# Patient Record
Sex: Male | Born: 1945 | Race: White | Hispanic: No | Marital: Married | Smoking: Former smoker
Health system: Southern US, Community
[De-identification: ages and names within clinical notes are randomized; demographics above are authoritative.]

## PROBLEM LIST (undated history)

## (undated) DIAGNOSIS — E785 Hyperlipidemia, unspecified: Secondary | ICD-10-CM

## (undated) DIAGNOSIS — F32A Depression, unspecified: Secondary | ICD-10-CM

## (undated) DIAGNOSIS — F1910 Other psychoactive substance abuse, uncomplicated: Secondary | ICD-10-CM

## (undated) DIAGNOSIS — I10 Essential (primary) hypertension: Secondary | ICD-10-CM

## (undated) DIAGNOSIS — F329 Major depressive disorder, single episode, unspecified: Secondary | ICD-10-CM

## (undated) DIAGNOSIS — I214 Non-ST elevation (NSTEMI) myocardial infarction: Secondary | ICD-10-CM

## (undated) DIAGNOSIS — M1990 Unspecified osteoarthritis, unspecified site: Secondary | ICD-10-CM

## (undated) DIAGNOSIS — Z780 Asymptomatic menopausal state: Secondary | ICD-10-CM

## (undated) HISTORY — PX: VASECTOMY: SHX75

## (undated) HISTORY — DX: Hyperlipidemia, unspecified: E78.5

## (undated) HISTORY — DX: Unspecified osteoarthritis, unspecified site: M19.90

## (undated) HISTORY — DX: Depression, unspecified: F32.A

## (undated) HISTORY — DX: Essential (primary) hypertension: I10

## (undated) HISTORY — PX: POLYPECTOMY: SHX149

## (undated) HISTORY — DX: Asymptomatic menopausal state: Z78.0

## (undated) HISTORY — DX: Other psychoactive substance abuse, uncomplicated: F19.10

## (undated) HISTORY — DX: Major depressive disorder, single episode, unspecified: F32.9

## (undated) HISTORY — PX: HERNIA REPAIR: SHX51

## (undated) HISTORY — PX: COLONOSCOPY: SHX174

---

## 2002-02-01 ENCOUNTER — Encounter: Payer: Self-pay | Admitting: Internal Medicine

## 2002-08-03 ENCOUNTER — Encounter (HOSPITAL_BASED_OUTPATIENT_CLINIC_OR_DEPARTMENT_OTHER): Payer: Self-pay | Admitting: General Surgery

## 2002-08-08 ENCOUNTER — Ambulatory Visit (HOSPITAL_COMMUNITY): Admission: RE | Admit: 2002-08-08 | Discharge: 2002-08-08 | Payer: Self-pay | Admitting: General Surgery

## 2008-01-04 ENCOUNTER — Telehealth: Payer: Self-pay | Admitting: Internal Medicine

## 2008-01-26 DIAGNOSIS — E785 Hyperlipidemia, unspecified: Secondary | ICD-10-CM | POA: Insufficient documentation

## 2008-01-26 DIAGNOSIS — Z87898 Personal history of other specified conditions: Secondary | ICD-10-CM | POA: Insufficient documentation

## 2008-01-26 DIAGNOSIS — F3289 Other specified depressive episodes: Secondary | ICD-10-CM | POA: Insufficient documentation

## 2008-01-26 DIAGNOSIS — K649 Unspecified hemorrhoids: Secondary | ICD-10-CM | POA: Insufficient documentation

## 2008-01-26 DIAGNOSIS — I10 Essential (primary) hypertension: Secondary | ICD-10-CM | POA: Insufficient documentation

## 2008-01-26 DIAGNOSIS — F329 Major depressive disorder, single episode, unspecified: Secondary | ICD-10-CM

## 2008-01-31 ENCOUNTER — Ambulatory Visit: Payer: Self-pay | Admitting: Internal Medicine

## 2008-01-31 DIAGNOSIS — R195 Other fecal abnormalities: Secondary | ICD-10-CM | POA: Insufficient documentation

## 2008-01-31 DIAGNOSIS — K644 Residual hemorrhoidal skin tags: Secondary | ICD-10-CM | POA: Insufficient documentation

## 2008-01-31 DIAGNOSIS — K625 Hemorrhage of anus and rectum: Secondary | ICD-10-CM | POA: Insufficient documentation

## 2008-02-03 ENCOUNTER — Ambulatory Visit: Payer: Self-pay | Admitting: Internal Medicine

## 2008-02-03 ENCOUNTER — Encounter: Payer: Self-pay | Admitting: Internal Medicine

## 2008-02-06 ENCOUNTER — Encounter: Payer: Self-pay | Admitting: Internal Medicine

## 2008-02-07 ENCOUNTER — Telehealth (INDEPENDENT_AMBULATORY_CARE_PROVIDER_SITE_OTHER): Payer: Self-pay

## 2008-04-13 ENCOUNTER — Encounter: Payer: Self-pay | Admitting: Internal Medicine

## 2010-04-07 ENCOUNTER — Emergency Department (HOSPITAL_COMMUNITY)
Admission: EM | Admit: 2010-04-07 | Discharge: 2010-04-07 | Payer: Self-pay | Source: Home / Self Care | Admitting: Emergency Medicine

## 2010-04-08 LAB — POCT CARDIAC MARKERS
CKMB, poc: 1.4 ng/mL (ref 1.0–8.0)
Troponin i, poc: <0.05 ng/mL (ref 0.00–0.09)

## 2010-04-08 LAB — POCT I-STAT 3, VENOUS BLOOD GAS (G3P V)
Acid-Base Excess: 1 mmol/L (ref 0.0–2.0)
Bicarbonate: 27 mEq/L — ABNORMAL HIGH (ref 20.0–24.0)
O2 Saturation: 46 %
O2 Saturation: 99 %
TCO2: 28 mmol/L (ref 0–100)
pCO2, Ven: 41.5 mmHg — ABNORMAL LOW (ref 45.0–50.0)
pH, Ven: 7.401 — ABNORMAL HIGH (ref 7.250–7.300)
pO2, Ven: 26 mmHg — CL (ref 30.0–45.0)
pO2, Ven: 118 mmHg — ABNORMAL HIGH (ref 30.0–45.0)

## 2010-04-08 LAB — COMPREHENSIVE METABOLIC PANEL
Albumin: 3.9 g/dL (ref 3.5–5.2)
Alkaline Phosphatase: 49 U/L (ref 39–117)
BUN: 13 mg/dL (ref 6–23)
Creatinine, Ser: 1.07 mg/dL (ref 0.4–1.5)
Potassium: 3.9 mEq/L (ref 3.5–5.1)
Total Protein: 6.4 g/dL (ref 6.0–8.3)

## 2010-04-08 LAB — CARBOXYHEMOGLOBIN
Carboxyhemoglobin: 2.4 % — ABNORMAL HIGH (ref 0.5–1.5)
Methemoglobin: 1 % (ref 0.0–1.5)
O2 Saturation: 64.5 %
O2 Saturation: 98.6 %
Total hemoglobin: 13.9 g/dL (ref 13.5–18.0)

## 2010-04-08 LAB — CBC
MCV: 84.6 fL (ref 78.0–100.0)
Platelets: 208 10*3/uL (ref 150–400)
RBC: 4.62 MIL/uL (ref 4.22–5.81)
WBC: 8.5 10*3/uL (ref 4.0–10.5)

## 2010-08-01 NOTE — Op Note (Signed)
NAME:  Edwin Mckee, Edwin Mckee NO.:  [id number]   MEDICAL RECORD NO.:  [id number]                   PATIENT TYPE:  OIB   LOCATION:  2873                                 FACILITY:  [facility]   PHYSICIAN:  Leonie Man, M.D.                DATE OF BIRTH:  04-23-1945   DATE OF PROCEDURE:  08/08/2002  DATE OF DISCHARGE:                                 OPERATIVE REPORT   PREOPERATIVE DIAGNOSIS:  Left inguinal hernia.   POSTOPERATIVE DIAGNOSIS:  Left inguinal hernia.   PROCEDURE:  Repair of left inguinal hernia with mesh.   SURGEON:  Leonie Man, M.D.   ASSISTANT:  Nurse.   ANESTHESIA:  General.   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  He comes now to the operating room for a repair of  his left inguinal hernia, after the risks and potential benefits of surgery  have been discussed, questions answered, and consent obtained.   DESCRIPTION OF PROCEDURE:  Following the induction of satisfactory general  anesthesia, with the patient positioned supinely, the left groin was prepped  and draped, to be included in the sterile operative field.  The lower  abdominal crease was infiltrated with 0.5% Marcaine with epinephrine, and a  transverse incision made in the lower abdominal crease, with dissection  carried down to the external oblique aponeurosis.  The external oblique  aponeurosis was then opened up through the external inguinal ring with  protection of the ilioinguinal nerve which was retracted inferiorly and  laterally.  The spermatic cord and its contents were elevated and held with  a Penrose drain.  Dissection along the anteromedial aspect of the spermatic  cord showed a moderate-sized lipoma which was dissected up to the internal  ring, crossclamped, and transected.  The lipoma was discarded, and the stump  secured  with a #2-0 silk tie.  There was no evidence of an indirect hernia.  There was, however, a very large direct hernia coming straight from the  Hesselbach's triangle, and that hernia was then repaired with an onlay patch  of polypropylene mesh which was fashioned to fit into the groin, and sewn in  that pubic tubercle with #2-0 Novofil suture and carried up along the  conjoined tendon to the internal ring.  Again, from the pubic tubercle a  second suture was brought up along the shelving edge of Poupart's ligament,  up to the internal ring.  The mesh had been split, so as to allow normal  protrusion of the cord.  The tails of the mesh were then trimmed and sutured  down into the internal oblique muscles behind the cord.  On checking, there  was no cord entrapment.  The ilioinguinal nerve was free and intact.  All  areas  of dissection were checked for hemostasis and noted to be dry.  The  sponge, instrument and sharp counts were verified.  The external oblique  aponeurosis was closed over the cord with a running #2-0 Vicryl suture.  Scarpa's fascia and the subcutaneous fat were closed with a running #3-0  Vicryl suture, and the skin closed with a running #4-0 Monocryl suture, and  then reinforced with Steri-Strips.  Sterile dressings  were applied.  The anesthetic was reversed.  The patient was removed from the operating room to the recovery room in  stable condition.  He tolerated the procedure well.                                                Leonie Man, M.D.    PB/MEDQ  D:  08/08/2002  T:  08/08/2002  Job:  [id number]

## 2011-03-04 ENCOUNTER — Encounter (INDEPENDENT_AMBULATORY_CARE_PROVIDER_SITE_OTHER): Payer: Federal, State, Local not specified - PPO | Admitting: Internal Medicine

## 2011-03-04 DIAGNOSIS — F341 Dysthymic disorder: Secondary | ICD-10-CM

## 2011-03-04 DIAGNOSIS — Z23 Encounter for immunization: Secondary | ICD-10-CM

## 2011-03-04 DIAGNOSIS — I10 Essential (primary) hypertension: Secondary | ICD-10-CM

## 2011-03-04 DIAGNOSIS — E7800 Pure hypercholesterolemia, unspecified: Secondary | ICD-10-CM

## 2011-03-04 DIAGNOSIS — Z0000 Encounter for general adult medical examination without abnormal findings: Secondary | ICD-10-CM

## 2011-09-02 ENCOUNTER — Ambulatory Visit: Payer: Federal, State, Local not specified - PPO | Admitting: Internal Medicine

## 2012-02-24 ENCOUNTER — Encounter: Payer: Federal, State, Local not specified - PPO | Admitting: Internal Medicine

## 2012-02-27 ENCOUNTER — Other Ambulatory Visit: Payer: Self-pay | Admitting: Internal Medicine

## 2012-02-27 NOTE — Telephone Encounter (Signed)
Please pull paper chart.  

## 2012-02-29 NOTE — Telephone Encounter (Signed)
Chart pulled to PA pool [id number]

## 2012-04-12 ENCOUNTER — Ambulatory Visit
Admission: RE | Admit: 2012-04-12 | Discharge: 2012-04-12 | Disposition: A | Discharge status: Home / Self Care | Payer: Federal, State, Local not specified - PPO | Source: Ambulatory Visit | Attending: Family Medicine | Admitting: Family Medicine

## 2012-04-12 ENCOUNTER — Ambulatory Visit (INDEPENDENT_AMBULATORY_CARE_PROVIDER_SITE_OTHER): Payer: Federal, State, Local not specified - PPO | Admitting: Family Medicine

## 2012-04-12 ENCOUNTER — Telehealth: Payer: Self-pay

## 2012-04-12 VITALS — BP 169/82 | HR 76 | Temp 97.9°F | Resp 18

## 2012-04-12 DIAGNOSIS — R109 Unspecified abdominal pain: Secondary | ICD-10-CM

## 2012-04-12 LAB — POCT URINALYSIS DIPSTICK
Bilirubin, UA: NEGATIVE
Blood, UA: NEGATIVE
Glucose, UA: NEGATIVE
Ketones, UA: NEGATIVE
Leukocytes, UA: NEGATIVE
Nitrite, UA: NEGATIVE
Protein, UA: NEGATIVE
Spec Grav, UA: <=1.005
Urobilinogen, UA: 0.2
pH, UA: 5.5

## 2012-04-12 LAB — POCT UA - MICROSCOPIC ONLY
Bacteria, U Microscopic: NEGATIVE
Casts, Ur, LPF, POC: NEGATIVE
Crystals, Ur, HPF, POC: NEGATIVE
Mucus, UA: NEGATIVE
RBC, urine, microscopic: NEGATIVE
WBC, Ur, HPF, POC: NEGATIVE
Yeast, UA: NEGATIVE

## 2012-04-12 MED ORDER — PREDNISONE 20 MG PO TABS: ORAL_TABLET | ORAL | Status: DC

## 2012-04-12 NOTE — Progress Notes (Signed)
67 yo with right testicular pain, right back and flank pain, and dysuria since Friday.  Saturday the pain was excrutiating.  Seemed to improve yesterday but it is worse today. The back pain is particularly bad. Some nausea with the pain.  No prior kidney stone or pain like this  Objective  NAD, alert Mild right CVA tenderness Circumcised male, normal testes Results for orders placed in visit on 04/12/12  POCT UA - MICROSCOPIC ONLY      Component Value Range   WBC, Ur, HPF, POC neg     RBC, urine, microscopic neg     Bacteria, U Microscopic neg     Mucus, UA neg     Epithelial cells, urine per micros 0-1     Crystals, Ur, HPF, POC neg     Casts, Ur, LPF, POC neg     Yeast, UA neg    POCT URINALYSIS DIPSTICK      Component Value Range   Color, UA yellow     Clarity, UA clear     Glucose, UA neg     Bilirubin, UA neg     Ketones, UA neg     Spec Grav, UA <=1.005     Blood, UA neg     pH, UA 5.5     Protein, UA neg     Urobilinogen, UA 0.2     Nitrite, UA neg     Leukocytes, UA Negative     Assessment: acute flank pain c/w kidney stone but the urine is negative.  Plan:  CT scan without contrast ---> negative   at this point, I think patient probably has a musculoskeletal problem and therefore I am going to prescribe prednisone 1. Acute flank pain  POCT UA - Microscopic Only, POCT urinalysis dipstick, Urine culture, US Renal, CT Abdomen Pelvis Wo Contrast, predniSONE (DELTASONE) 20 MG tablet, DISCONTINUED: predniSONE (DELTASONE) 20 MG tablet

## 2012-04-12 NOTE — Patient Instructions (Addendum)
Go to [facility] now for your CT scan.   Driving directions to [address] [place], [place], [place] [address] [id number]  - more info    [address]  [place], [place] [address]     1. Head south on [address] toward [address]      0.5 mi    2. Sharp left onto [address]      0.6 mi    3. Turn left onto the [address] ramp      0.2 mi    4. Merge onto [address]      3.0 mi    5. Continue straight to stay on [address]      0.4 mi    6. Slight left to stay on [facility]  Destination will be on the right     1.0 mi     [address] [place], [place] [address]

## 2012-04-12 NOTE — Telephone Encounter (Signed)
Patient saw dr. Milus Mckee and prescribed  Pregnzone, he wanted to speak to someone in clinical concerning this rx and why he wasn't given any pain medication.even though he didn't ask for any.  T: [phone]

## 2012-04-13 LAB — URINE CULTURE
Colony Count: NO GROWTH
Organism ID, Bacteria: NO GROWTH

## 2012-04-13 MED ORDER — TRAMADOL HCL 50 MG PO TABS: 50.0000 mg | ORAL_TABLET | Freq: Three times a day (TID) | ORAL | Status: DC | PRN

## 2012-04-13 NOTE — Telephone Encounter (Signed)
Dr Milus Glazier has sent in Tramadol. I have called patient to advise.

## 2012-04-13 NOTE — Telephone Encounter (Signed)
Called patient advised will send message to Dr Milus Glazier to see if he can write for pain meds. [facility] uses [facility], please advise. Patient states he is very upset. I advised him I will call him back today.

## 2012-04-30 ENCOUNTER — Other Ambulatory Visit: Payer: Self-pay

## 2012-05-04 ENCOUNTER — Ambulatory Visit (INDEPENDENT_AMBULATORY_CARE_PROVIDER_SITE_OTHER): Payer: Federal, State, Local not specified - PPO | Admitting: Internal Medicine

## 2012-05-04 ENCOUNTER — Encounter: Payer: Self-pay | Admitting: Internal Medicine

## 2012-05-04 VITALS — BP 140/86 | HR 63 | Temp 98.2°F | Resp 16 | Ht 69.5 in | Wt 196.6 lb

## 2012-05-04 DIAGNOSIS — H9190 Unspecified hearing loss, unspecified ear: Secondary | ICD-10-CM

## 2012-05-04 DIAGNOSIS — R000 Tachycardia, unspecified: Secondary | ICD-10-CM

## 2012-05-04 DIAGNOSIS — R51 Headache: Secondary | ICD-10-CM

## 2012-05-04 DIAGNOSIS — E785 Hyperlipidemia, unspecified: Secondary | ICD-10-CM

## 2012-05-04 DIAGNOSIS — F329 Major depressive disorder, single episode, unspecified: Secondary | ICD-10-CM

## 2012-05-04 DIAGNOSIS — I10 Essential (primary) hypertension: Secondary | ICD-10-CM

## 2012-05-04 DIAGNOSIS — F3289 Other specified depressive episodes: Secondary | ICD-10-CM

## 2012-05-04 DIAGNOSIS — Z0000 Encounter for general adult medical examination without abnormal findings: Secondary | ICD-10-CM

## 2012-05-04 LAB — CBC WITH DIFFERENTIAL/PLATELET
Basophils Absolute: 0 10*3/uL (ref 0.0–0.1)
Eosinophils Absolute: 0.1 10*3/uL (ref 0.0–0.7)
Eosinophils Relative: 1 % (ref 0–5)
Lymphocytes Relative: 34 % (ref 12–46)
MCV: 82.7 fL (ref 78.0–100.0)
Neutrophils Relative %: 58 % (ref 43–77)
Platelets: 264 10*3/uL (ref 150–400)
RBC: 5.1 MIL/uL (ref 4.22–5.81)
RDW: 14.1 % (ref 11.5–15.5)
WBC: 6.1 10*3/uL (ref 4.0–10.5)

## 2012-05-04 LAB — TSH: TSH: 3.201 u[IU]/mL (ref 0.350–4.500)

## 2012-05-04 LAB — POCT URINALYSIS DIPSTICK
Blood, UA: NEGATIVE
Nitrite, UA: NEGATIVE
Protein, UA: NEGATIVE
Urobilinogen, UA: 0.2
pH, UA: 6

## 2012-05-04 LAB — COMPREHENSIVE METABOLIC PANEL
Albumin: 4.4 g/dL (ref 3.5–5.2)
BUN: 15 mg/dL (ref 6–23)
CO2: 23 mEq/L (ref 19–32)
Glucose, Bld: 100 mg/dL — ABNORMAL HIGH (ref 70–99)
Potassium: 4.1 mEq/L (ref 3.5–5.3)
Sodium: 139 mEq/L (ref 135–145)
Total Protein: 6.9 g/dL (ref 6.0–8.3)

## 2012-05-04 LAB — LIPID PANEL
Cholesterol: 188 mg/dL (ref 0–200)
HDL: 44 mg/dL (ref >39)

## 2012-05-04 LAB — PSA: PSA: 0.65 ng/mL (ref <=4.00)

## 2012-05-04 MED ORDER — LISINOPRIL-HYDROCHLOROTHIAZIDE 10-12.5 MG PO TABS: 1.0000 | ORAL_TABLET | Freq: Every day | ORAL | Status: DC

## 2012-05-04 NOTE — Progress Notes (Addendum)
Subjective:    Patient ID: Edwin Mckee, male    DOB: 1945/12/07, 67 y.o.   MRN: [id number]  HPIc/o 1 month hx of daily HAs with flushing, tachycardia and elevated BP- see hx of decrease from lisin/hct to just hct 1 year ago. Home BPs good til one month ago. No lifestyle or dietary changes. See ROS  Needs benadryl 25 to sleep for 7-8 mos w/out side effects  Ran [employer] w/ daughter this year  See  1/28 OV for flank pain-neg Ct  Brother SVT tdap 11/17/2006 pneumo 02/2011  Review of Systems  Constitutional: Negative for fever, activity change, appetite change, fatigue and unexpected weight change.  HENT: Positive for hearing loss, facial swelling, neck pain and neck stiffness. Negative for ear pain, congestion, rhinorrhea, sneezing, trouble swallowing, dental problem, postnasal drip, sinus pressure and tinnitus.        Hearing aids stable for at least 5 years Occasional neck stiffness in the morning which usually resolves with exercise Occasional facial swelling with the exposure to allergens but this never interferes with activity  Eyes: Negative for photophobia, discharge and visual disturbance.  Respiratory: Positive for shortness of breath. Negative for cough and wheezing.        Occasional shortness of breath when he feels like the palpitations and flushing are active, but no dyspnea on exertion  Cardiovascular: Positive for palpitations. Negative for chest pain and leg swelling.  Gastrointestinal: Negative for abdominal pain, diarrhea and constipation.  Endocrine: Negative for cold intolerance, heat intolerance, polydipsia, polyphagia and polyuria.  Genitourinary: Positive for testicular pain. Negative for dysuria, urgency, frequency, hematuria, flank pain and decreased urine volume.       Testicular pain has been on and off in the right testicle for a few years without swelling or urinary discomfort  Musculoskeletal: Positive for back pain and arthralgias. Negative for  myalgias, joint swelling and gait problem.       Had a recent episode of lumbar strain which took 6 weeks of exercise to get back to normal/has recovered and is just now running again  Skin: Negative for rash and wound.  Neurological: Positive for light-headedness and headaches. Negative for dizziness, tremors, syncope, weakness and numbness.  Hematological: Negative for adenopathy. Does not bruise/bleed easily.  Psychiatric/Behavioral: Negative for suicidal ideas, sleep disturbance, self-injury, dysphoric mood and decreased concentration.       After years of Prozac has discontinued this and has had 6 months of medications without any relapse of depression or anxiety       Objective:   Physical Exam BP 140/86  Pulse 63  Temp(Src) 98.2 F (36.8 C) (Oral)  Resp 16  Ht 5' 9.5" (1.765 m)  Wt 196 lb 9.6 oz (89.177 kg)  BMI 28.63 kg/m2  SpO2 98% No acute distress Pupils equal round reactive to light and accommodation/EOMs conjugate ENT clear No nodes or thyromegaly Heart regular without murmur or click/no carotid bruits Lungs clear to auscultation Abdomen supple without organomegaly or masses Genital exam-testicles normal except scars from vasectomy tie off Lumbar spine straight and nontender/straight leg raise negative to 90 Shoulders hips knees and ankles intact Neurological intact Psychiatric intact  EKG= within normal limits    Assessment & Plan:  Annual physical exam Problem #1 hypertension-recently labile and associated with headache flushing and palpitations  Uncontrolled by history over the recent 3 weeks Problem #2 history of hyperlipidemia Problem #3 history of hemorrhoids Problem #4 history hearing loss/aides Problem #5 multiple arthralgias Problem #6 recent back pain  Problem #7 depression resolved  Results for orders placed in visit on 05/04/12  CBC WITH DIFFERENTIAL      Result Value Range   WBC 6.1  4.0 - 10.5 K/uL   RBC 5.10  4.22 - 5.81 MIL/uL    Hemoglobin 14.7  13.0 - 17.0 g/dL   HCT 16.1  09.6 - 04.5 %   MCV 82.7  78.0 - 100.0 fL   MCH 28.8  26.0 - 34.0 pg   MCHC 34.8  30.0 - 36.0 g/dL   RDW 40.9  81.1 - 91.4 %   Platelets 264  150 - 400 K/uL   Neutrophils Relative 58  43 - 77 %   Neutro Abs 3.5  1.7 - 7.7 K/uL   Lymphocytes Relative 34  12 - 46 %   Lymphs Abs 2.1  0.7 - 4.0 K/uL   Monocytes Relative 7  3 - 12 %   Monocytes Absolute 0.4  0.1 - 1.0 K/uL   Eosinophils Relative 1  0 - 5 %   Eosinophils Absolute 0.1  0.0 - 0.7 K/uL   Basophils Relative 0  0 - 1 %   Basophils Absolute 0.0  0.0 - 0.1 K/uL   Smear Review Criteria for review not met    LIPID PANEL      Result Value Range   Cholesterol 188  0 - 200 mg/dL   Triglycerides 782  <956 mg/dL   HDL 44  >21 mg/dL   Total CHOL/HDL Ratio 4.3     VLDL 24  0 - 40 mg/dL   LDL Cholesterol 308 (*) 0 - 99 mg/dL  COMPREHENSIVE METABOLIC PANEL      Result Value Range   Sodium 139  135 - 145 mEq/L   Potassium 4.1  3.5 - 5.3 mEq/L   Chloride 104  96 - 112 mEq/L   CO2 23  19 - 32 mEq/L   Glucose, Bld 100 (*) 70 - 99 mg/dL   BUN 15  6 - 23 mg/dL   Creat 6.57  8.46 - 9.62 mg/dL   Total Bilirubin 0.6  0.3 - 1.2 mg/dL   Alkaline Phosphatase 52  39 - 117 U/L   AST 28  0 - 37 U/L   ALT 38  0 - 53 U/L   Total Protein 6.9  6.0 - 8.3 g/dL   Albumin 4.4  3.5 - 5.2 g/dL   Calcium 9.7  8.4 - 95.2 mg/dL  TSH      Result Value Range   TSH 3.201  0.350 - 4.500 uIU/mL  PSA      Result Value Range   PSA 0.65  <=4.00 ng/mL  POCT URINALYSIS DIPSTICK      Result Value Range   Color, UA yellow     Clarity, UA clear     Glucose, UA neg     Bilirubin, UA neg     Ketones, UA neg     Spec Grav, UA <=1.005     Blood, UA neg     pH, UA 6.0     Protein, UA neg     Urobilinogen, UA 0.2     Nitrite, UA neg     Leukocytes, UA Negative     Add lisinopril to hydrochlorothiazide Change Zocor to Lipitor 40 Will order a 24-hour urine for metanephrines and catecholamines

## 2012-05-04 NOTE — Progress Notes (Deleted)
  Subjective:    Patient ID: Edwin Mckee, male    DOB: [birth year], 67 y.o.   MRN: [id number]  HPI    Review of Systems  Constitutional: Positive for fatigue.  HENT: Positive for hearing loss, facial swelling, neck pain and neck stiffness.   Respiratory: Positive for shortness of breath.   Genitourinary: Positive for testicular pain.  Musculoskeletal: Positive for myalgias, back pain, joint swelling and arthralgias.  Skin: Negative.   Allergic/Immunologic: Negative.   Neurological: Positive for light-headedness and headaches.  Hematological: Negative.   Psychiatric/Behavioral: Negative.        Objective:   Physical Exam        Assessment & Plan:

## 2012-05-06 MED ORDER — ATORVASTATIN CALCIUM 40 MG PO TABS: 40.0000 mg | ORAL_TABLET | Freq: Every day | ORAL | Status: DC

## 2012-05-06 NOTE — Addendum Note (Signed)
Addended by: Tonye Pearson on: 05/06/2012 11:47 PM   Modules accepted: Orders

## 2012-06-01 ENCOUNTER — Telehealth: Payer: Self-pay | Admitting: *Deleted

## 2012-06-01 NOTE — Telephone Encounter (Signed)
Lmom.  Pt was suppose to have collected 24hr urine specimans and have not yet to collect.  i was calling to see if maybe he needed another req.

## 2012-06-07 NOTE — Telephone Encounter (Signed)
Unable to reach letter mailed to patient

## 2012-06-16 ENCOUNTER — Ambulatory Visit (INDEPENDENT_AMBULATORY_CARE_PROVIDER_SITE_OTHER): Payer: Federal, State, Local not specified - PPO | Admitting: Physician Assistant

## 2012-06-16 VITALS — BP 118/84 | HR 75 | Temp 98.4°F | Resp 17 | Ht 70.5 in | Wt 192.0 lb

## 2012-06-16 DIAGNOSIS — F3289 Other specified depressive episodes: Secondary | ICD-10-CM

## 2012-06-16 DIAGNOSIS — F32A Depression, unspecified: Secondary | ICD-10-CM

## 2012-06-16 DIAGNOSIS — F329 Major depressive disorder, single episode, unspecified: Secondary | ICD-10-CM

## 2012-06-16 MED ORDER — FLUOXETINE HCL 40 MG PO CAPS: 40.0000 mg | ORAL_CAPSULE | Freq: Every day | ORAL | Status: DC

## 2012-06-16 NOTE — Patient Instructions (Addendum)
Restart the fluoxetine 40mg  once daily.  If you are having side effects when starting this dose, please let us know and we can send a smaller dose an titrate you up.  Please give [place] a call in one month and let us know how the medication is doing.  Certainly if any concerns arise we would be happy to see you back in clinic.

## 2012-06-16 NOTE — Progress Notes (Signed)
  Subjective:    Patient ID: Edwin Mckee, male    DOB: [birth year], 67 y.o.   MRN: [id number]  HPI   Edwin Mckee is a very pleasant 67 yr old male here desiring to restart fluoxetine.  States he has been on this for about 20 yrs, but decided to stop at last office visit.  States that his wife would like him to restart this.  He tells me he feels well but that his wife notices things that he doesn't.  Thinks that his wife perceives more conflict now that he has stopped the prozac.  Does endorse that he is feeling a little more down recently.  Has difficulty dealing with the fact that he is aging and less able to do thinks he once could.  "I'm aging, but not gracefully."  Lately has not really felt like getting out and doing stuff, instead just sitting inside on the computer.  Recognizes that he may just need to be on this medication.  He has been on it long term and is ok with continuing.  Additionally, pt was ordered a 24 hour urine at last visit due to elevated BP, HA, palpitations, and flushing.  Pt did not have this done, and feels like symptoms have resolved.  Now has great control of BP with addition of lisinopril to HCTZ.  He does not want to proceed with the 24 hour urine at this time.      Review of Systems  Constitutional: Negative.   HENT: Negative.   Respiratory: Negative.   Cardiovascular: Negative for chest pain and palpitations.  Gastrointestinal: Negative.   Neurological: Negative.   Psychiatric/Behavioral: Positive for dysphoric mood.       Objective:   Physical Exam  Vitals reviewed. Constitutional: He is oriented to person, place, and time. He appears well-developed and well-nourished. No distress.  HENT:  Head: Normocephalic and atraumatic.  Eyes: Conjunctivae are normal. No scleral icterus.  Cardiovascular: Normal rate, regular rhythm and normal heart sounds.   Pulmonary/Chest: Effort normal and breath sounds normal. He has no wheezes. He has no rales.  Neurological:  He is alert and oriented to person, place, and time.  Skin: Skin is warm and dry.  Psychiatric: He has a normal mood and affect. His speech is normal and behavior is normal. Judgment and thought content normal. He expresses no homicidal and no suicidal ideation.     Filed Vitals:   06/16/12 0931  BP: 118/84  Pulse: 75  Temp: 98.4 F (36.9 C)  Resp: 17        Assessment & Plan:  Depression - Plan: FLUoxetine (PROZAC) 40 MG capsule   Edwin Mckee is a very pleasant 67 yr old male here desiring to restart fluoxetine.  Was previously on 40mg  daily.  He would like to restart at this dose.  If he is experiencing side effects initially with this we can decreased the dose and titrate up.  Pt will call in 1 month to let us know how restarting this is going.  Pt understands and is in agreement.

## 2012-11-02 ENCOUNTER — Ambulatory Visit (INDEPENDENT_AMBULATORY_CARE_PROVIDER_SITE_OTHER): Payer: Federal, State, Local not specified - PPO | Admitting: Family Medicine

## 2012-11-02 VITALS — BP 140/82 | HR 69 | Temp 98.6°F | Resp 16 | Ht 70.5 in | Wt 190.6 lb

## 2012-11-02 DIAGNOSIS — I10 Essential (primary) hypertension: Secondary | ICD-10-CM

## 2012-11-02 DIAGNOSIS — R5381 Other malaise: Secondary | ICD-10-CM

## 2012-11-02 MED ORDER — LOSARTAN POTASSIUM-HCTZ 100-12.5 MG PO TABS: 1.0000 | ORAL_TABLET | Freq: Every day | ORAL | Status: DC

## 2012-11-02 MED ORDER — AMLODIPINE BESYLATE 2.5 MG PO TABS: 2.5000 mg | ORAL_TABLET | Freq: Every day | ORAL | Status: DC

## 2012-11-02 NOTE — Progress Notes (Signed)
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  Blood pressures are running over 150/85  F/H positive for hypertension (father), he developed dementia and alzheimer's and died in 2003/08/15 Quit the lisinopril 7/20.  Cough disappeared 8/1. Last week he took his wife's Losartan HCT 100-12.5  Gets regular exercise, run or walk daily, golf twice a week, yoga 5 days a week Very drowsy, needing to take nap every afternoon.  This has been a problem for over a year.  Objective:  NAD, appears to be very fit, good eye contact and appropriate. BP 130/82 Results for orders placed in visit on 05/04/12  CBC WITH DIFFERENTIAL      Result Value Range   WBC 6.1  4.0 - 10.5 K/uL   RBC 5.10  4.22 - 5.81 MIL/uL   Hemoglobin 14.7  13.0 - 17.0 g/dL   HCT 16.1  09.6 - 04.5 %   MCV 82.7  78.0 - 100.0 fL   MCH 28.8  26.0 - 34.0 pg   MCHC 34.8  30.0 - 36.0 g/dL   RDW 40.9  81.1 - 91.4 %   Platelets 264  150 - 400 K/uL   Neutrophils Relative % 58  43 - 77 %   Neutro Abs 3.5  1.7 - 7.7 K/uL   Lymphocytes Relative 34  12 - 46 %   Lymphs Abs 2.1  0.7 - 4.0 K/uL   Monocytes Relative 7  3 - 12 %   Monocytes Absolute 0.4  0.1 - 1.0 K/uL   Eosinophils Relative 1  0 - 5 %   Eosinophils Absolute 0.1  0.0 - 0.7 K/uL   Basophils Relative 0  0 - 1 %   Basophils Absolute 0.0  0.0 - 0.1 K/uL   Smear Review Criteria for review not met    LIPID PANEL      Result Value Range   Cholesterol 188  0 - 200 mg/dL   Triglycerides 782  <956 mg/dL   HDL 44  >21 mg/dL   Total CHOL/HDL Ratio 4.3     VLDL 24  0 - 40 mg/dL   LDL Cholesterol 308 (*) 0 - 99 mg/dL  COMPREHENSIVE METABOLIC PANEL      Result Value Range   Sodium 139  135 - 145 mEq/L   Potassium 4.1  3.5 - 5.3 mEq/L   Chloride 104  96 - 112 mEq/L   CO2 23  19 - 32 mEq/L   Glucose, Bld 100 (*) 70 - 99 mg/dL   BUN 15  6 - 23 mg/dL   Creat 6.57  8.46 - 9.62 mg/dL   Total Bilirubin 0.6  0.3 - 1.2 mg/dL   Alkaline Phosphatase 52  39 - 117 U/L   AST 28  0 - 37 U/L   ALT 38  0 - 53 U/L   Total Protein 6.9  6.0 - 8.3 g/dL   Albumin 4.4  3.5 - 5.2 g/dL   Calcium 9.7  8.4 - 95.2 mg/dL  TSH      Result Value Range   TSH 3.201  0.350 - 4.500 uIU/mL  PSA      Result Value Range   PSA 0.65  <=4.00 ng/mL  POCT URINALYSIS DIPSTICK      Result Value Range   Color, UA yellow     Clarity, UA clear     Glucose, UA neg     Bilirubin, UA neg     Ketones, UA neg  Spec Grav, UA <=1.005     Blood, UA neg     pH, UA 6.0     Protein, UA neg     Urobilinogen, UA 0.2     Nitrite, UA neg     Leukocytes, UA Negative     Assessment: borderline HTN values on Losartan. The fatigue is concerning to me. I think that his cholesterol-lowering medicine may be contributing to this, and the patient is incredibly physically active. Therefore think it's reasonable to discontinue the cholesterol medicine for several months to see if his fatigue doesn't left. Also I think checking the potassium be important  Plan:  Hypertension - Plan: Comprehensive metabolic panel, losartan-hydrochlorothiazide (HYZAAR) 100-12.5 MG per tablet, amLODipine (NORVASC) 2.5 MG tablet  Signed, Elvina Sidle, MD

## 2012-11-03 LAB — COMPREHENSIVE METABOLIC PANEL
ALT: 29 U/L (ref 0–53)
AST: 27 U/L (ref 0–37)
Albumin: 4.8 g/dL (ref 3.5–5.2)
Alkaline Phosphatase: 64 U/L (ref 39–117)
BUN: 13 mg/dL (ref 6–23)
CO2: 29 mEq/L (ref 19–32)
Calcium: 9.9 mg/dL (ref 8.4–10.5)
Chloride: 103 mEq/L (ref 96–112)
Creat: 1.01 mg/dL (ref 0.50–1.35)
Glucose, Bld: 89 mg/dL (ref 70–99)
Potassium: 4.3 mEq/L (ref 3.5–5.3)
Sodium: 139 mEq/L (ref 135–145)
Total Bilirubin: 0.7 mg/dL (ref 0.3–1.2)
Total Protein: 7.6 g/dL (ref 6.0–8.3)

## 2012-12-05 ENCOUNTER — Encounter: Payer: Self-pay | Admitting: Internal Medicine

## 2013-01-19 ENCOUNTER — Other Ambulatory Visit: Payer: Self-pay

## 2013-05-10 ENCOUNTER — Encounter: Payer: Self-pay | Admitting: Internal Medicine

## 2013-05-10 ENCOUNTER — Ambulatory Visit (INDEPENDENT_AMBULATORY_CARE_PROVIDER_SITE_OTHER): Payer: Federal, State, Local not specified - PPO | Admitting: Internal Medicine

## 2013-05-10 VITALS — BP 152/94 | HR 76 | Temp 98.0°F | Resp 18 | Ht 70.5 in | Wt 199.0 lb

## 2013-05-10 DIAGNOSIS — Z125 Encounter for screening for malignant neoplasm of prostate: Secondary | ICD-10-CM

## 2013-05-10 DIAGNOSIS — Z0000 Encounter for general adult medical examination without abnormal findings: Secondary | ICD-10-CM

## 2013-05-10 DIAGNOSIS — I10 Essential (primary) hypertension: Secondary | ICD-10-CM

## 2013-05-10 DIAGNOSIS — E785 Hyperlipidemia, unspecified: Secondary | ICD-10-CM

## 2013-05-10 LAB — CBC WITH DIFFERENTIAL/PLATELET
BASOS PCT: 1 % (ref 0–1)
Basophils Absolute: 0.1 10*3/uL (ref 0.0–0.1)
Eosinophils Absolute: 0.1 10*3/uL (ref 0.0–0.7)
Eosinophils Relative: 1 % (ref 0–5)
HEMATOCRIT: 43.4 % (ref 39.0–52.0)
HEMOGLOBIN: 14.8 g/dL (ref 13.0–17.0)
Lymphocytes Relative: 31 % (ref 12–46)
Lymphs Abs: 2 10*3/uL (ref 0.7–4.0)
MCH: 29 pg (ref 26.0–34.0)
MCHC: 34.1 g/dL (ref 30.0–36.0)
MCV: 84.9 fL (ref 78.0–100.0)
MONO ABS: 0.5 10*3/uL (ref 0.1–1.0)
MONOS PCT: 8 % (ref 3–12)
NEUTROS ABS: 3.8 10*3/uL (ref 1.7–7.7)
Neutrophils Relative %: 59 % (ref 43–77)
Platelets: 264 10*3/uL (ref 150–400)
RBC: 5.11 MIL/uL (ref 4.22–5.81)
RDW: 14 % (ref 11.5–15.5)
WBC: 6.5 10*3/uL (ref 4.0–10.5)

## 2013-05-10 MED ORDER — LOSARTAN POTASSIUM-HCTZ 100-12.5 MG PO TABS: 1.0000 | ORAL_TABLET | Freq: Every day | ORAL | Status: DC

## 2013-05-10 NOTE — Progress Notes (Signed)
Subjective:    Patient ID: Edwin Mckee, male    DOB: 10/30/1945, 68 y.o.   MRN: [id number] This chart was scribed for Edwin Koyanagi, MD by Rolanda Lundborg, ED Scribe. This patient was seen in room 27 and the patient's care was started at 10:51 AM.  Chief Complaint  Patient presents with  . Annual Exam    HPI HPI Comments: [name] is a 68 y.o. male who presents to the [facility] for an annual physical exam. He was seen by Dr Joseph Art for HTN in August who discontinued his losartan and put him on Norvasc. He states his home readings have been high since changing to Norvasc.   He is being followed by Dr Theda Sers for shoulder pain. He has had multiple injections and is scheduled for an MRI.  No increase in number of times he gets up to urinate at night. Once at baseline.  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 He has also been building his wife a greenhouse. He is still doing yoga, playing golf, and walking for exercise.   PCP - No primary provider on file.  Patient Active Problem List   Diagnosis Date Noted  . Hearing loss 05/04/2012  . HEMORRHOIDS-EXTERNAL 01/31/2008  . RECTAL BLEEDING 01/31/2008  . FECAL OCCULT BLOOD 01/31/2008  . HYPERLIPIDEMIA 01/26/2008  . HYPERTENSION 01/26/2008  . HEMORRHOIDS 01/26/2008  . INGUINAL HERNIA, HX OF 01/26/2008   Current Outpatient Prescriptions on File Prior to Visit  Medication Sig Dispense Refill  . amLODipine (NORVASC) 2.5 MG tablet Take 1 tablet (2.5 mg total) by mouth daily.  90 tablet  3  . aspirin 81 MG tablet Take 81 mg by mouth daily.       No current facility-administered medications on file prior to visit.      Review of Systems  Neurological: Positive for dizziness (sometimes when standing up from a bent position, fleeting).       Objective:   Physical Exam  Nursing note and vitals  reviewed. Constitutional: He is oriented to person, place, and time. He appears well-developed and well-nourished. No distress.  HENT:  Head: Normocephalic and atraumatic.  Right Ear: External ear normal.  Left Ear: External ear normal.  Nose: Nose normal.  Mouth/Throat: Oropharynx is clear and moist.  Eyes: Conjunctivae and EOM are normal. Pupils are equal, round, and reactive to light.  Neck: Normal range of motion. Neck supple. No thyromegaly present.  Cardiovascular: Normal rate, regular rhythm and normal heart sounds.   No murmur heard. Pulmonary/Chest: Effort normal and breath sounds normal. No respiratory distress.  Abdominal: Soft. He exhibits no mass. There is no tenderness.  Musculoskeletal: Normal range of motion. He exhibits no edema.  Lymphadenopathy:    He has no cervical adenopathy.  Neurological: He is alert and oriented to person, place, and time.  Skin: Skin is warm and dry.  Psychiatric: He has a normal mood and affect. His behavior is normal.     Filed Vitals:   05/10/13 1042  BP: 152/94  Pulse: 76  Temp: 98 F (36.7 C)  TempSrc: Oral  Resp: 18  Height: 5' 10.5" (1.791 m)  Weight: 199 lb (90.266 kg)  SpO2: 98%        Assessment & Plan:  I have completed the patient encounter in its entirety as documented by the scribe, with editing by me where necessary. Herbie Baltimore  P. Laney Pastor, M.D. Hypertension - Plan: losartan-hydrochlorothiazide (HYZAAR) 100-12.5 MG per tablet, CBC with Differential, Comprehensive metabolic panel  HYPERLIPIDEMIA - Plan: Lipid panel  HYPERTENSION  Screening for prostate cancer - Plan: PSA  Meds ordered this encounter  Medications  . FLUZONE HIGH-DOSE injection    Sig:   . losartan-hydrochlorothiazide (HYZAAR) 100-12.5 MG per tablet    Sig: Take 1 tablet by mouth daily.    Dispense:  90 tablet    Refill:  3

## 2013-05-11 LAB — COMPREHENSIVE METABOLIC PANEL
ALK PHOS: 59 U/L (ref 39–117)
ALT: 42 U/L (ref 0–53)
AST: 28 U/L (ref 0–37)
Albumin: 4.5 g/dL (ref 3.5–5.2)
BUN: 14 mg/dL (ref 6–23)
CO2: 26 mEq/L (ref 19–32)
CREATININE: 0.96 mg/dL (ref 0.50–1.35)
Calcium: 9.7 mg/dL (ref 8.4–10.5)
Chloride: 104 mEq/L (ref 96–112)
Glucose, Bld: 114 mg/dL — ABNORMAL HIGH (ref 70–99)
POTASSIUM: 4.4 meq/L (ref 3.5–5.3)
Sodium: 139 mEq/L (ref 135–145)
Total Bilirubin: 0.5 mg/dL (ref 0.2–1.2)
Total Protein: 6.8 g/dL (ref 6.0–8.3)

## 2013-05-11 LAB — LIPID PANEL
Cholesterol: 213 mg/dL — ABNORMAL HIGH (ref 0–200)
HDL: 41 mg/dL (ref >39)
LDL CALC: 145 mg/dL — AB (ref 0–99)
TRIGLYCERIDES: 135 mg/dL (ref <150)
Total CHOL/HDL Ratio: 5.2 Ratio
VLDL: 27 mg/dL (ref 0–40)

## 2013-05-11 LAB — PSA: PSA: 0.54 ng/mL (ref <=4.00)

## 2013-05-19 ENCOUNTER — Encounter: Payer: Self-pay | Admitting: Internal Medicine

## 2013-08-12 ENCOUNTER — Encounter: Payer: Self-pay | Admitting: Internal Medicine

## 2014-04-25 ENCOUNTER — Encounter: Payer: Self-pay | Admitting: Cardiology

## 2014-04-25 ENCOUNTER — Encounter (HOSPITAL_COMMUNITY)
Admission: EM | Disposition: A | Discharge status: Home / Self Care | Payer: Federal, State, Local not specified - PPO | Source: Other Acute Inpatient Hospital | Attending: Cardiology

## 2014-04-25 ENCOUNTER — Inpatient Hospital Stay (HOSPITAL_COMMUNITY)
Admission: EM | Admit: 2014-04-25 | Discharge: 2014-04-26 | DRG: 247 | Disposition: A | Discharge status: Home / Self Care | Payer: Medicare Other | Source: Other Acute Inpatient Hospital | Attending: Cardiology | Admitting: Cardiology

## 2014-04-25 DIAGNOSIS — I214 Non-ST elevation (NSTEMI) myocardial infarction: Principal | ICD-10-CM

## 2014-04-25 DIAGNOSIS — Z823 Family history of stroke: Secondary | ICD-10-CM

## 2014-04-25 DIAGNOSIS — Z955 Presence of coronary angioplasty implant and graft: Secondary | ICD-10-CM

## 2014-04-25 DIAGNOSIS — E785 Hyperlipidemia, unspecified: Secondary | ICD-10-CM | POA: Diagnosis present

## 2014-04-25 DIAGNOSIS — Z79899 Other long term (current) drug therapy: Secondary | ICD-10-CM

## 2014-04-25 DIAGNOSIS — Z87891 Personal history of nicotine dependence: Secondary | ICD-10-CM | POA: Diagnosis not present

## 2014-04-25 DIAGNOSIS — Z8249 Family history of ischemic heart disease and other diseases of the circulatory system: Secondary | ICD-10-CM | POA: Diagnosis not present

## 2014-04-25 DIAGNOSIS — I10 Essential (primary) hypertension: Secondary | ICD-10-CM | POA: Diagnosis not present

## 2014-04-25 DIAGNOSIS — R42 Dizziness and giddiness: Secondary | ICD-10-CM | POA: Diagnosis present

## 2014-04-25 DIAGNOSIS — Z7982 Long term (current) use of aspirin: Secondary | ICD-10-CM | POA: Diagnosis not present

## 2014-04-25 DIAGNOSIS — I2510 Atherosclerotic heart disease of native coronary artery without angina pectoris: Secondary | ICD-10-CM | POA: Diagnosis present

## 2014-04-25 DIAGNOSIS — M1990 Unspecified osteoarthritis, unspecified site: Secondary | ICD-10-CM | POA: Diagnosis present

## 2014-04-25 DIAGNOSIS — R079 Chest pain, unspecified: Secondary | ICD-10-CM | POA: Diagnosis present

## 2014-04-25 HISTORY — PX: LEFT HEART CATHETERIZATION WITH CORONARY ANGIOGRAM: SHX5451

## 2014-04-25 HISTORY — DX: Non-ST elevation (NSTEMI) myocardial infarction: I21.4

## 2014-04-25 SURGERY — LEFT HEART CATHETERIZATION WITH CORONARY ANGIOGRAM
Anesthesia: LOCAL

## 2014-04-25 MED ORDER — FENTANYL CITRATE 0.05 MG/ML IJ SOLN
INTRAMUSCULAR | Status: AC
  Filled 2014-04-25: qty 2

## 2014-04-25 MED ORDER — VERAPAMIL HCL 2.5 MG/ML IV SOLN
INTRAVENOUS | Status: AC
  Filled 2014-04-25: qty 2

## 2014-04-25 MED ORDER — HYDROMORPHONE HCL 1 MG/ML IJ SOLN
INTRAMUSCULAR | Status: AC
  Filled 2014-04-25: qty 1

## 2014-04-25 MED ORDER — TICAGRELOR 90 MG PO TABS
ORAL_TABLET | ORAL | Status: AC
  Filled 2014-04-25: qty 1

## 2014-04-25 MED ORDER — TICAGRELOR 90 MG PO TABS
90.0000 mg | ORAL_TABLET | Freq: Two times a day (BID) | ORAL | Status: DC
  Administered 2014-04-26: 90 mg via ORAL
  Filled 2014-04-25 (×3): qty 1

## 2014-04-25 MED ORDER — ACETAMINOPHEN 325 MG PO TABS: 650.0000 mg | ORAL_TABLET | ORAL | Status: DC | PRN

## 2014-04-25 MED ORDER — HEPARIN (PORCINE) IN NACL 2-0.9 UNIT/ML-% IJ SOLN
INTRAMUSCULAR | Status: AC
  Filled 2014-04-25: qty 1500

## 2014-04-25 MED ORDER — ATORVASTATIN CALCIUM 80 MG PO TABS
80.0000 mg | ORAL_TABLET | Freq: Every day | ORAL | Status: DC
  Administered 2014-04-25: 80 mg via ORAL
  Filled 2014-04-25 (×2): qty 1

## 2014-04-25 MED ORDER — NITROGLYCERIN 1 MG/10 ML FOR IR/CATH LAB
INTRA_ARTERIAL | Status: AC
  Filled 2014-04-25: qty 10

## 2014-04-25 MED ORDER — MIDAZOLAM HCL 2 MG/2ML IJ SOLN
INTRAMUSCULAR | Status: AC
  Filled 2014-04-25: qty 2

## 2014-04-25 MED ORDER — ONDANSETRON HCL 4 MG/2ML IJ SOLN: 4.0000 mg | Freq: Four times a day (QID) | INTRAMUSCULAR | Status: DC | PRN

## 2014-04-25 MED ORDER — SODIUM CHLORIDE 0.9 % IV SOLN
INTRAVENOUS | Status: AC
  Administered 2014-04-25: 23:00:00 via INTRAVENOUS

## 2014-04-25 MED ORDER — ZOLPIDEM TARTRATE 5 MG PO TABS: 5.0000 mg | ORAL_TABLET | Freq: Every evening | ORAL | Status: DC | PRN

## 2014-04-25 MED ORDER — LIDOCAINE HCL (PF) 1 % IJ SOLN
INTRAMUSCULAR | Status: AC
Start: 2014-04-25 — End: 2014-04-25
  Filled 2014-04-25: qty 30

## 2014-04-25 MED ORDER — ATENOLOL 25 MG PO TABS
25.0000 mg | ORAL_TABLET | Freq: Every day | ORAL | Status: DC
  Administered 2014-04-26: 25 mg via ORAL
  Filled 2014-04-25: qty 1

## 2014-04-25 MED ORDER — BIVALIRUDIN 250 MG IV SOLR
INTRAVENOUS | Status: AC
  Filled 2014-04-25: qty 250

## 2014-04-25 MED ORDER — NITROGLYCERIN 0.4 MG SL SUBL: 0.4000 mg | SUBLINGUAL_TABLET | SUBLINGUAL | Status: DC | PRN

## 2014-04-25 MED ORDER — ASPIRIN EC 81 MG PO TBEC
81.0000 mg | DELAYED_RELEASE_TABLET | Freq: Every day | ORAL | Status: DC
  Administered 2014-04-26: 81 mg via ORAL
  Filled 2014-04-25: qty 1

## 2014-04-25 NOTE — Consult Note (Signed)
Responded to page received 7:36pm. Patient was being brought in code STEMI w/ETA 1 1/2 hrs. Upon arrival patient was taken directly to Cath Lab. This Chaplain spoke with the spouse providing support, spouse was advised that MD would speak with her upon completion of procedure. Chaplain sat in the waiting room with spouse and listened as she verbalized her anxiety related to what was not yet known concerning the patient. Therapy of presence was provided prayer for healing provided at spouse request. Follow up to be made by Chaplain in the morning, to speak with and pray with patient, also requested by spouse.

## 2014-04-25 NOTE — CV Procedure (Signed)
Procedure performed:  Left heart catheterization including hemodynamic monitoring of the left ventricle, LV gram. Selective right and left coronary arteriography. PTCA and stenting of the proximal and mid LAD with implantation of a 3.0 x 38 mm resolute DES and balloon angioplasty of the D1 with 2.5 x 15 mm balloon.  Indication: Patient is a 69 year-old Caucasian male with history of hypertension,  hyperlipidemia, prior tobacco use disorder, admitted to the hospital after direct admission via angiography suite, presenting with non-ST elevation myocardial infarction. Due to ongoing chest discomfort, he was brought emergently to the coronary angiography suite. He had positive cardiac markers for non-ST elevation myocardial Infarction  At [facility] [place].   Hemodynamic data: Left ventricular pressure was 104/-1 with end*pressure of 7 mmHg.  Aortic pressure was 103/59 with a mean of 79 mm mercury. There was no pressure gradient across the aortic valve.   Left ventricle: Performed in the RAO projection revealed LVEF of 55 %. There was no significant MR. no significant wall motion abnormality.  Right coronary artery:  Dominant. There is mild diffuse coronary calcification is best in the proximal segment. Mild luminal irregularities present.  Left main coronary artery is large and normal.  Circumflex coronary artery: A large vessel giving origin to a large obtuse marginal 1. OM1 has a high bifurcation. There is mild coronary calcification evident in the circumflex coronary artery without any high-grade stenosis. Mild disease is present diffusely in the proximal OM1.  LAD:  LAD gives origin to a large diagonal-1, very small diagonal 2. There is severe diffuse calcification noted in the proximal and midsegment of the LAD. Diagonal 1 has ostial 80-90% stenosis followed by mid 80-90% stenosis. LAD has diffuse 80% to 90% stenosis in some views in the proximal and midsegment. Mid to distal  segment of LAD had very mild disease. There is mild tortuosity in the proximal mid LAD.  Impression: Two-vessel coronary artery disease involving the LAD and diagonal. Heavy calcification is evident. Due to ongoing chest discomfort will proceed with PCI.  Interventional data: Successful PTCA and stenting of the proximal and mid LAD with implantation of a 3.0 x 38 mm resolute DES. PTCA and balloon angioplasty of the large D1 proximal and ostial segment with a 2.5 x 15 mm Euphora. Stenosis reduced from 80-90% to 0% with brisk TIMI-3 flow maintained at the end of the procedure.  Will need Dual antiplatelet therapy with BRILINTA and ASA 81 mg for at least one year. Very difficult procedure due to heavy calcification, multiple guidewire and guide catheter exchange.   Technique of diagnostic cardiac catheterization:  Under sterile precautions using a 6 French right radial  arterial access, a 6 French sheath was introduced into the right radial artery. A 5 [place] Tig 4 catheter was advanced into the ascending aorta selective  right coronary artery and left coronary artery was cannulated and angiography was performed in multiple views. The catheter was pulled back Out of the body over exchange length J-wire. Same Catheter was used to perform LV gram which was performed in RAO projection. Catheter exchanged out of the body over J-Wire.   Technique of intervention:  Using a 6 [place] initially XB 3.5, then AL-1 6 [place], Ikari 4.0 guide catheter the left main  coronary  was selected and cannulated. Using Angiomax for anticoagulation, I utilized a Asahi pro-water 0.014 x 1 90 cm wire to wire the D1 and similar size length BMW guidewire and across the LAD coronary artery without any difficulty.  I placed the tip of the wire into the distal  coronary artery. Angiography was performed.   Then I utilized a 2.5 x 15 mm Euphora balloon , I performed balloon angioplasty at 14 atmospheric pressure x 2 for 50 seconds each  into the proximal D1 and ostial D1. This is followed by using the same balloon to cross into the LAD and multiple inflations in the LAD was performed at 14 atmospheric pressure for 50 seconds 2. Intracoronary nitroglycerin was administered. Then I decided to stent the LAD with a 3.0 x 38 mm resolute DES. Due to inability to cross the stenosis in spite of deep engagement with the guide catheter of the left main and proximal LAD, I decided to post dilate the proximal and mid LAD the second time with a 3.0 x 20 mm Roberts Euphora which was performed at 14 atmospheric pressure 2 for 50 seconds each. This was followed I attempt at crossing the stent, do to failure, I utilized a guideliner, and with the help of this, I was able to take stent to the site of the stenosis, stent deployed at 12 atmospheric pressure for 50 seconds. This was followed by post dilatation with a 3.0 x 20 mm noncompliant balloon at 20 atmospheric pressure for 30 seconds each followed by intracoronary nitroglycerin administration and angiography was repeated. Excellent TIMI-3 flow was evident, there was no sidebranch compromise. There was pseudo-lesion evident in the mid to distal LAD, these disappeared with the withdrawal of the wire. The guidewire was withdrawn, guide catheter disengaged and pulled out of the body over the J-wire. EXCHANGE was done over the J-wire. There was no immediate complication. Hemostasis achieved with TR band. Patient tolerated the procedure well.

## 2014-04-25 NOTE — H&P (Signed)
Chief Complaint: Chest pain since 1 pm today HPI: Edwin Mckee is a 69 yo white male with h/o HTN, hyperlipidemia and Depression presenting for chest pain that started at 1 PM this afternoon, chest pain with radiation to his jaw and also to his back. No other associated symptoms. He was seen at [facility] emergency room, was found to have positive cardiac troponin markers for non-ST elevation myocardial infarction along with elevated CK. EKG did not reveal any significant acute ST abnormality. Due to ongoing chest discomfort he was emergently transferred to [facility] cardiac catheterization lab. He continued to have mild chest discomfort which had improved but still persistent.He denies SOB, N/V, diaphoresis, or syncope. He states he may feel a heart flutter once every 2-3 weeks, but nothing regularly. Pt does have a distant history of smoking, 5-10 pack years by his estimate.  Past Medical History  Diagnosis Date  . Hypertension   . Arthritis   . Depression   . Substance abuse   . NSTEMI (non-ST elevated myocardial infarction) 04/25/2014    Past Surgical History  Procedure Laterality Date  . Hernia repair    . Vasectomy      Family History  Problem Relation Age of Onset  . Cancer Mother     lung  . Hypertension Father   . Alzheimer's disease Father   . Heart disease Sister   . Heart disease Brother   . Stroke Maternal Grandmother   . Alcohol abuse Maternal Grandfather   . Cancer Maternal Grandfather   . Stroke Paternal Grandmother   . Cancer Paternal Grandfather    Social History:  reports that he quit smoking about 32 years ago. He does not have any smokeless tobacco history on file. He reports that he does not drink alcohol or use illicit drugs.  Allergies: No Known Allergies  Medications Prior to Admission  Medication Sig Dispense Refill  . amLODipine (NORVASC) 2.5 MG tablet Take 1 tablet (2.5 mg total) by mouth daily. 90 tablet 3  . aspirin 81 MG tablet Take 81 mg by  mouth daily.    Marland Kitchen FLUZONE HIGH-DOSE injection     . losartan-hydrochlorothiazide (HYZAAR) 100-12.5 MG per tablet Take 1 tablet by mouth daily. 90 tablet 3    Review of Systems - Negative except Mild depression, chronic dizziness and occasional palpitations.  There were no vitals taken for this visit. General appearance: alert, cooperative, appears stated age and no distress Eyes: negative findings: lids and lashes normal Neck: no adenopathy, no carotid bruit, no JVD, supple, symmetrical, trachea midline and thyroid not enlarged, symmetric, no tenderness/mass/nodules Neck: JVP - normal, carotids 2+= without bruits Resp: clear to auscultation bilaterally Chest wall: no tenderness Cardio: regular rate and rhythm, S1, S2 normal, no murmur, click, rub or gallop GI: soft, non-tender; bowel sounds normal; no masses,  no organomegaly Extremities: extremities normal, atraumatic, no cyanosis or edema Pulses: 2+ and symmetric Skin: Skin color, texture, turgor normal. No rashes or lesions Neurologic: Grossly normal  No results found for this or any previous visit (from the past 48 hour(s)). No results found.  Labs:   Lab Results  Component Value Date   WBC 6.5 05/10/2013   HGB 14.8 05/10/2013   HCT 43.4 05/10/2013   MCV 84.9 05/10/2013   PLT 264 05/10/2013   No results for input(s): NA, K, CL, CO2, BUN, CREATININE, CALCIUM, PROT, BILITOT, ALKPHOS, ALT, AST, GLUCOSE in the last 168 hours.  Invalid input(s): LABALBU No results found for: CKTOTAL, CKMB, CKMBINDEX, TROPONINI  Lipid Panel     Component Value Date/Time   CHOL 213* 05/10/2013 1148   TRIG 135 05/10/2013 1148   HDL 41 05/10/2013 1148   CHOLHDL 5.2 05/10/2013 1148   VLDL 27 05/10/2013 1148   LDLCALC 145* 05/10/2013 1148    EKG: normal EKG, sinus bradycardia otherwise no significant change, PVC.  Assessment/Plan 1. Non-ST elevation myocardial infarction with cardiac markers positive for non-ST elevation myocardial  infarction. Cardiac troponin was 0.034, CPK 211, CK-MB 11.3. CMP was normal serum creatinine 1.31. CBC normal. 2. Hypertension 3. Hyperlipidemia 4. Chronic dizziness 5. History of tobacco use disorder  Recommendation: Patient will be taken to the coronary angiography suite emergently and further recommendations will follow following coronary angiography.  Laverda Page, MD 04/25/2014, 10:43 PM [facility] [place] Pager: [phone] Office: [phone] If no answer: Cell:  [phone]

## 2014-04-26 ENCOUNTER — Encounter (HOSPITAL_COMMUNITY): Payer: Self-pay | Admitting: Cardiology

## 2014-04-26 LAB — CBC
HCT: 34.8 % — ABNORMAL LOW (ref 39.0–52.0)
HEMOGLOBIN: 11.7 g/dL — AB (ref 13.0–17.0)
MCH: 29.5 pg (ref 26.0–34.0)
MCHC: 33.6 g/dL (ref 30.0–36.0)
MCV: 87.9 fL (ref 78.0–100.0)
Platelets: 179 10*3/uL (ref 150–400)
RBC: 3.96 MIL/uL — AB (ref 4.22–5.81)
RDW: 13.7 % (ref 11.5–15.5)
WBC: 6 10*3/uL (ref 4.0–10.5)

## 2014-04-26 LAB — BASIC METABOLIC PANEL
Anion gap: 3 — ABNORMAL LOW (ref 5–15)
BUN: 16 mg/dL (ref 6–23)
CALCIUM: 8.6 mg/dL (ref 8.4–10.5)
CO2: 26 mmol/L (ref 19–32)
Chloride: 112 mmol/L (ref 96–112)
Creatinine, Ser: 1.19 mg/dL (ref 0.50–1.35)
GFR calc Af Amer: 72 mL/min — ABNORMAL LOW (ref >90)
GFR calc non Af Amer: 62 mL/min — ABNORMAL LOW (ref >90)
GLUCOSE: 94 mg/dL (ref 70–99)
Potassium: 3.6 mmol/L (ref 3.5–5.1)
Sodium: 141 mmol/L (ref 135–145)

## 2014-04-26 LAB — POCT ACTIVATED CLOTTING TIME
Activated Clotting Time: 183 seconds
Activated Clotting Time: 472 seconds

## 2014-04-26 LAB — MAGNESIUM: Magnesium: 2.2 mg/dL (ref 1.5–2.5)

## 2014-04-26 LAB — TROPONIN I: TROPONIN I: 5.11 ng/mL — AB (ref <0.031)

## 2014-04-26 LAB — MRSA PCR SCREENING: MRSA BY PCR: NEGATIVE

## 2014-04-26 MED ORDER — TICAGRELOR 90 MG PO TABS: 90.0000 mg | ORAL_TABLET | Freq: Two times a day (BID) | ORAL | Status: DC

## 2014-04-26 MED ORDER — ATENOLOL 25 MG PO TABS: 12.5000 mg | ORAL_TABLET | Freq: Every day | ORAL | Status: DC

## 2014-04-26 MED ORDER — FLUOXETINE HCL 40 MG PO CAPS: 20.0000 mg | ORAL_CAPSULE | Freq: Every day | ORAL | Status: DC

## 2014-04-26 MED ORDER — ATORVASTATIN CALCIUM 80 MG PO TABS: 80.0000 mg | ORAL_TABLET | Freq: Every day | ORAL | Status: DC

## 2014-04-26 MED FILL — Sodium Chloride IV Soln 0.9%: INTRAVENOUS | Qty: 50 | Status: AC

## 2014-04-26 NOTE — Discharge Summary (Signed)
Physician Discharge Summary  Patient ID: Edwin Mckee MRN: [id number] DOB/AGE: 1945-12-09 70 y.o.  Admit date: 04/25/2014 Discharge date: 04/26/2014  Primary Discharge Diagnosis: NSTEMI s/p PTCA and stenting of the proximal and mid LAD and balloon angioplasty of the D1 Secondary Discharge Diagnosis: Hyperlipidemia, HTN  Significant Diagnostic Studies: Coronary angiogram 04/25/2014: Two-vessel coronary artery disease involving the LAD and diagonal. Heavy calcification is evident.  Hospital Course: Edwin Mckee is a 69 yo white male with h/o HTN and hyperlipidemia who presented to [facility] ED for chest pain that started at 1 PM yesterday afternoon, with radiation to his jaw and also to his back. No other associated symptoms. He was found to have positive cardiac troponin markers for non-ST elevation myocardial infarction along with elevated CK. EKG did not reveal any significant acute ST abnormality. Due to ongoing chest discomfort he was emergently transferred to [facility] cardiac catheterization lab. He continued to have mild chest discomfort which had improved but still persistent.He underwent selective right and left coronary arteriography with successful PTCA and stenting of the proximal and mid LAD with implantation of a 3.0 x 38 mm resolute DES and balloon angioplasty of the D1 with 2.5 x 15 mm balloon.  He denies any recurrence of chest pain this morning.  Right radial access site asymptomatic.  Recommendations on discharge: Follow up outpatient next week.  Continue aspirin indefinitely and add Brilinta for at least 1 year.  Increase Atorvastatin to 80mg .  Add atenolol for cardioprotection.  I have discontinued lisinopril d/t hypotension to allow for addition of beta blocker.  Med list had amlodipine and losartan listed as home medications, however, he was not taking these.    Discharge Exam: Blood pressure 115/60, pulse 71, temperature 97.9 F (36.6 C), temperature source Oral, resp.  rate 15, height 5\' 10"  (1.778 m), weight 79 kg (174 lb 2.6 oz), SpO2 100 %.    General appearance: alert, cooperative, appears stated age and no distress Neck: no adenopathy, no carotid bruit, no JVD, supple, symmetrical, trachea midline and thyroid not enlarged, symmetric, no tenderness/mass/nodules Resp: clear to auscultation bilaterally Chest wall: no tenderness Cardio: regular rate and rhythm, S1, S2 normal, no murmur, click, rub or gallop Extremities: extremities normal, atraumatic, no cyanosis or edema Pulses: 2+ and symmetric Skin: Skin color, texture, turgor normal. No rashes or lesions   Labs:   Lab Results  Component Value Date   WBC 6.0 04/26/2014   HGB 11.7* 04/26/2014   HCT 34.8* 04/26/2014   MCV 87.9 04/26/2014   PLT 179 04/26/2014    Recent Labs Lab 04/26/14 0317  NA 141  K 3.6  CL 112  CO2 26  BUN 16  CREATININE 1.19  CALCIUM 8.6  GLUCOSE 94   No results found for: CKTOTAL, CKMB, CKMBINDEX, TROPONINI  Lipid Panel     Component Value Date/Time   CHOL 213* 05/10/2013 1148   TRIG 135 05/10/2013 1148   HDL 41 05/10/2013 1148   CHOLHDL 5.2 05/10/2013 1148   VLDL 27 05/10/2013 1148   LDLCALC 145* 05/10/2013 1148    EKG 04/26/2014: Sinus bradycardia at a rate of 47 bpm with first-degree AV block.  PACs.  No significant change from prior.    Radiology: No results found.    FOLLOW UP PLANS AND APPOINTMENTS    Medication List    ASK your doctor about these medications        amLODipine 2.5 MG tablet  Commonly known as:  NORVASC  Take 1 tablet (2.5  mg total) by mouth daily.     aspirin 81 MG tablet  Take 81 mg by mouth daily.     FLUZONE HIGH-DOSE injection  Generic drug:  influenza vac split trivalent high-dose     losartan-hydrochlorothiazide 100-12.5 MG per tablet  Commonly known as:  HYZAAR  Take 1 tablet by mouth daily.          Rachel Bo, NP-C 04/26/2014, 8:32 AM [facility] Pager:  [phone] Office: [phone]

## 2014-04-26 NOTE — Care Management Note (Signed)
    Page 1 of 1   04/26/2014     9:36:56 AM CARE MANAGEMENT NOTE 04/26/2014  Patient:  Edwin Mckee, Edwin Mckee   Account Number:  [id number]  Date Initiated:  04/26/2014  Documentation initiated by:  Elissa Hefty  Subjective/Objective Assessment:   adm w mi     Action/Plan:   Orlene Plum w wife   Anticipated DC Date:     Anticipated DC Plan:        DC Planning Services  CM consult  Medication Assistance      Choice offered to / List presented to:             Status of service:   Medicare Important Message given?   (If response is "NO", the following Medicare IM given date fields will be blank) Date Medicare IM given:   Medicare IM given by:   Date Additional Medicare IM given:   Additional Medicare IM given by:    Discharge Disposition:    Per UR Regulation:  Reviewed for med. necessity/level of care/duration of stay  If discussed at [place] of Stay Meetings, dates discussed:    Comments:  2/11 0931 debbie Edwin Pafford rn,bsn gave pt 30day and copay assist card for brilinta.

## 2014-04-26 NOTE — Progress Notes (Signed)
Pt had run of VT. Pt was asymptomatic-asleep. Will continue to monitor closely.

## 2014-04-26 NOTE — Progress Notes (Signed)
CARDIAC REHAB PHASE I   PRE:  Rate/Rhythm: 53 SB PACs  BP:  Supine:   Sitting: 104/45  Standing:    SaO2:   MODE:  Ambulation: 700 ft   POST:  Rate/Rhythm: 67 SR PACs  BP:  Supine:   Sitting: 126/42  Standing:    SaO2:  1155-1253 Pt walked 700 ft with steady gait. No CP. Tolerated well. MI ed done with pt and wife who voiced understanding. Discussed CRP 2 and permission given to refer to [facility] program. Reviewed importance of brilinta with stent. Pt has brilinta booklet. Reviewed NTG use and calling 911.   Graylon Good, RN BSN  04/26/2014 12:47 PM

## 2014-04-26 NOTE — Progress Notes (Signed)
CRITICAL VALUE ALERT  Critical value received:  Troponin 5.11  Date of notification:  04/26/2014  Time of notification:  1232  Critical value read back:Yes.    Nurse who received alert:  Dannielle Karvonen, RN  MD notified (1st page):  Kela Millin, MD  Time of first page:  1233  MD notified (2nd page):  Time of second page:  Responding MD:  Kela Millin, MD   Time MD responded:  1233  Dr. Einar Gip aware, pt okay to be discharged per order.

## 2014-05-16 ENCOUNTER — Encounter: Payer: Federal, State, Local not specified - PPO | Admitting: Internal Medicine

## 2014-05-24 ENCOUNTER — Encounter (HOSPITAL_COMMUNITY)
Admission: RE | Admit: 2014-05-24 | Discharge: 2014-05-24 | Disposition: A | Discharge status: Home / Self Care | Payer: Federal, State, Local not specified - PPO | Source: Ambulatory Visit | Attending: Cardiology | Admitting: Cardiology

## 2014-05-24 DIAGNOSIS — I252 Old myocardial infarction: Secondary | ICD-10-CM | POA: Insufficient documentation

## 2014-05-24 DIAGNOSIS — Z48812 Encounter for surgical aftercare following surgery on the circulatory system: Secondary | ICD-10-CM | POA: Insufficient documentation

## 2014-05-24 DIAGNOSIS — Z955 Presence of coronary angioplasty implant and graft: Secondary | ICD-10-CM | POA: Insufficient documentation

## 2014-05-24 NOTE — Progress Notes (Signed)
Cardiac Rehab Medication Review by a Pharmacist  Does the patient  feel that his/her medications are working for him/her?  Yes   Has the patient been experiencing any side effects to the medications prescribed?  Yes - shortness of breath with the brillinta   Does the patient measure his/her own blood pressure or blood glucose at home?  yes - every morning  Does the patient have any problems obtaining medications due to transportation or finances?   no  Understanding of regimen: excellent Understanding of indications: excellent Potential of compliance: excellent    Pharmacist comments: 68yoM presenting to cardiac rehab orientation in good spirits. Patient can verbalize how he is taking each medication and when. When started on the medications he felt as if he was experiencing reflux or burning. When asked about shortness of breath he did admit that was probably more of what he was experiencing. He says it has gotten a lot better. It was explained to him that this was most likely due to the [place] as it is a common side effect. He takes his BP every morning and states that he doesn't have problems obtaining his medications but he is concerned regarding the price of his Brillinta. His wife is working with the [facility]. All questions were answered.   Thank you for allowing pharmacy to be part of this patient's care team  Kewanda Poland M. Gayle Collard, Pharm.D Clinical Pharmacy Resident Pager: [phone] 05/24/2014 .9:04 AM

## 2014-05-30 ENCOUNTER — Encounter (HOSPITAL_COMMUNITY): Payer: Self-pay

## 2014-05-30 ENCOUNTER — Encounter (HOSPITAL_COMMUNITY)
Admission: RE | Admit: 2014-05-30 | Discharge: 2014-05-30 | Disposition: A | Discharge status: Home / Self Care | Payer: Federal, State, Local not specified - PPO | Source: Ambulatory Visit | Attending: Cardiology | Admitting: Cardiology

## 2014-05-30 DIAGNOSIS — Z48812 Encounter for surgical aftercare following surgery on the circulatory system: Secondary | ICD-10-CM | POA: Diagnosis present

## 2014-05-30 DIAGNOSIS — I252 Old myocardial infarction: Secondary | ICD-10-CM | POA: Diagnosis not present

## 2014-05-30 DIAGNOSIS — Z955 Presence of coronary angioplasty implant and graft: Secondary | ICD-10-CM | POA: Diagnosis not present

## 2014-05-30 NOTE — Progress Notes (Signed)
Pt started cardiac rehab today.  Pt tolerated light exercise without difficulty.  VSS, telemetry-sinus rhythm, frequent PAC.  Asymptomatic.  PHQ-5. Pt reports long history of depression treated with Prozac.  However pt reports he stopped prozac upon hospital DC at Dr. Einar Gip recommendation.  Pt states past few weeks he has noticed increased feelings of tension, frustration and anger.  Pt advised to discuss these symptoms and withdrawing prozac  with his PCP, who currently treats his depression.  Pt also reports he was prescribed restoril by Dr. Einar Gip which has been effective.  Pt enjoys playing golf, running, walking, gardening and woodworking.  Pt goals for cardiac rehab are to restore stamina and heart health.  Pt is hoping to be strong enough to run a 1/2 marathon again.  Pt oriented to exercise equipment and routine.  Understanding verbalized.

## 2014-06-01 ENCOUNTER — Encounter (HOSPITAL_COMMUNITY)
Admission: RE | Admit: 2014-06-01 | Discharge: 2014-06-01 | Disposition: A | Discharge status: Home / Self Care | Payer: Federal, State, Local not specified - PPO | Source: Ambulatory Visit | Attending: Cardiology | Admitting: Cardiology

## 2014-06-01 DIAGNOSIS — Z48812 Encounter for surgical aftercare following surgery on the circulatory system: Secondary | ICD-10-CM | POA: Diagnosis not present

## 2014-06-04 ENCOUNTER — Encounter (HOSPITAL_COMMUNITY)
Admission: RE | Admit: 2014-06-04 | Discharge: 2014-06-04 | Disposition: A | Discharge status: Home / Self Care | Payer: Federal, State, Local not specified - PPO | Source: Ambulatory Visit | Attending: Cardiology | Admitting: Cardiology

## 2014-06-04 DIAGNOSIS — Z48812 Encounter for surgical aftercare following surgery on the circulatory system: Secondary | ICD-10-CM | POA: Diagnosis not present

## 2014-06-04 NOTE — Progress Notes (Signed)
Reviewed home exercise guidelines with patient including endpoints, temperature precautions, target heart rate and rate of perceived exertion. Pt plans to walk as his mode of home exercise. Prior to MI, pt was jogging/running regularly and goal is to return to previous exercise plan. Pt voices understanding of instructions given.

## 2014-06-06 ENCOUNTER — Encounter (HOSPITAL_COMMUNITY)
Admission: RE | Admit: 2014-06-06 | Discharge: 2014-06-06 | Disposition: A | Discharge status: Home / Self Care | Payer: Federal, State, Local not specified - PPO | Source: Ambulatory Visit | Attending: Cardiology | Admitting: Cardiology

## 2014-06-06 DIAGNOSIS — Z48812 Encounter for surgical aftercare following surgery on the circulatory system: Secondary | ICD-10-CM | POA: Diagnosis not present

## 2014-06-08 ENCOUNTER — Encounter (HOSPITAL_COMMUNITY)
Admission: RE | Admit: 2014-06-08 | Discharge: 2014-06-08 | Disposition: A | Discharge status: Home / Self Care | Payer: Federal, State, Local not specified - PPO | Source: Ambulatory Visit | Attending: Cardiology | Admitting: Cardiology

## 2014-06-08 DIAGNOSIS — Z48812 Encounter for surgical aftercare following surgery on the circulatory system: Secondary | ICD-10-CM | POA: Diagnosis not present

## 2014-06-08 NOTE — Progress Notes (Addendum)
PSYCHOSOCIAL ASSESSMENT  Pt psychosocial assessment reveals no barriers to rehab participation.  Pt quality of life is slightly altered by his physical constraints which limits his ability to perform tasks as prior to his illness. Specifically is concerned about his risk since he considered himself to be otherwise healthy and physically active.  Pt reports continued mild chest discomfort present since his stent placement.  He has discussed this with Dr. Einar Gip. Pt descriptions the discomfort as mild, comes and goes, no contributing factors.  Pt advised to contact Dr. Einar Gip if pain worsens, occurs with activity or reproduces his anginal equivalent.    Pt also concerned about frequent naps although this has been a long time habit for him, present prior to his recent cardiac event. Pt admits this may be contributed to periods of overexertion.  Pt exhibits positive coping skills and has supportive family.   Pt is happily married.  n Offered emotional support and reassurance.  Will continue to monitor.

## 2014-06-11 ENCOUNTER — Encounter (HOSPITAL_COMMUNITY)
Admission: RE | Admit: 2014-06-11 | Discharge: 2014-06-11 | Disposition: A | Discharge status: Home / Self Care | Payer: Federal, State, Local not specified - PPO | Source: Ambulatory Visit | Attending: Cardiology | Admitting: Cardiology

## 2014-06-11 DIAGNOSIS — Z48812 Encounter for surgical aftercare following surgery on the circulatory system: Secondary | ICD-10-CM | POA: Diagnosis not present

## 2014-06-13 ENCOUNTER — Encounter (HOSPITAL_COMMUNITY)
Admission: RE | Admit: 2014-06-13 | Discharge: 2014-06-13 | Disposition: A | Discharge status: Home / Self Care | Payer: Federal, State, Local not specified - PPO | Source: Ambulatory Visit | Attending: Cardiology | Admitting: Cardiology

## 2014-06-13 DIAGNOSIS — Z48812 Encounter for surgical aftercare following surgery on the circulatory system: Secondary | ICD-10-CM | POA: Diagnosis not present

## 2014-06-13 NOTE — Progress Notes (Signed)
Today at cardiac rehab pt c/o constant chest pain, describes as mild ache in left upper breast area that has been present >1 month.  Pt states this pain is different than his typical angina however he is concerned because of recent stent placement. Pt also c/o occasional palpitations unrelated to chest pain.  Pt reports these occur at night at rest.  Pt denies increased pain or palpitations with activity at home and at cardiac rehab. Pt also denies change in pain quality with touch, movement or inspiration.  PC to Dr. Irven Shelling nurse who reviewed pt history and symptoms with Neldon Labella, NP. Per Bridgette, pt instructed to use NTG SL as directed  if pain increased or worsens.  Contact Dr. Irven Shelling office if pain relieved with NTG, or if pain persists or worsens.  Present to ED for sudden severe unrelieved discomfort.  Understanding verbalized.

## 2014-06-15 ENCOUNTER — Encounter (HOSPITAL_COMMUNITY)
Admission: RE | Admit: 2014-06-15 | Discharge: 2014-06-15 | Disposition: A | Discharge status: Home / Self Care | Payer: Federal, State, Local not specified - PPO | Source: Ambulatory Visit | Attending: Cardiology | Admitting: Cardiology

## 2014-06-15 DIAGNOSIS — Z955 Presence of coronary angioplasty implant and graft: Secondary | ICD-10-CM | POA: Diagnosis not present

## 2014-06-15 DIAGNOSIS — I252 Old myocardial infarction: Secondary | ICD-10-CM | POA: Diagnosis not present

## 2014-06-15 DIAGNOSIS — Z48812 Encounter for surgical aftercare following surgery on the circulatory system: Secondary | ICD-10-CM | POA: Insufficient documentation

## 2014-06-18 ENCOUNTER — Encounter (HOSPITAL_COMMUNITY): Payer: Self-pay

## 2014-06-18 ENCOUNTER — Encounter (HOSPITAL_COMMUNITY)
Admission: RE | Admit: 2014-06-18 | Discharge: 2014-06-18 | Disposition: A | Discharge status: Home / Self Care | Payer: Federal, State, Local not specified - PPO | Source: Ambulatory Visit | Attending: Cardiology | Admitting: Cardiology

## 2014-06-18 DIAGNOSIS — Z48812 Encounter for surgical aftercare following surgery on the circulatory system: Secondary | ICD-10-CM | POA: Diagnosis not present

## 2014-06-18 NOTE — Progress Notes (Signed)
Pt exited  cardiac rehab program today with completion of 9 exercise sessions in Phase II.pt feels he has adequately achieved his personal goals of feeling more confident and safe with exercise.  Pt feels he has regained his peace of mind with activity at cardiac rehab and at home.   Pt maintained good attendance and progressed nicely during his participation in rehab as evidenced by increased MET level.   Medication list reconciled. Repeat  PHQ score- 0 .   Pt plans to continue exercising on his own at home.

## 2014-06-20 ENCOUNTER — Encounter (HOSPITAL_COMMUNITY): Payer: Federal, State, Local not specified - PPO

## 2014-06-22 ENCOUNTER — Encounter (HOSPITAL_COMMUNITY): Payer: Federal, State, Local not specified - PPO

## 2014-06-25 ENCOUNTER — Encounter (HOSPITAL_COMMUNITY): Payer: Federal, State, Local not specified - PPO

## 2014-06-27 ENCOUNTER — Encounter (HOSPITAL_COMMUNITY): Payer: Federal, State, Local not specified - PPO

## 2014-06-29 ENCOUNTER — Encounter (HOSPITAL_COMMUNITY): Payer: Federal, State, Local not specified - PPO

## 2014-07-02 ENCOUNTER — Encounter (HOSPITAL_COMMUNITY): Payer: Federal, State, Local not specified - PPO

## 2014-07-04 ENCOUNTER — Encounter (HOSPITAL_COMMUNITY): Payer: Federal, State, Local not specified - PPO

## 2014-07-06 ENCOUNTER — Encounter (HOSPITAL_COMMUNITY): Payer: Federal, State, Local not specified - PPO

## 2014-07-09 ENCOUNTER — Encounter (HOSPITAL_COMMUNITY): Payer: Federal, State, Local not specified - PPO

## 2014-07-11 ENCOUNTER — Encounter (HOSPITAL_COMMUNITY): Payer: Federal, State, Local not specified - PPO

## 2014-07-13 ENCOUNTER — Encounter (HOSPITAL_COMMUNITY): Payer: Federal, State, Local not specified - PPO

## 2014-07-16 ENCOUNTER — Encounter (HOSPITAL_COMMUNITY): Payer: Federal, State, Local not specified - PPO

## 2014-07-18 ENCOUNTER — Encounter (HOSPITAL_COMMUNITY): Payer: Federal, State, Local not specified - PPO

## 2014-07-20 ENCOUNTER — Encounter (HOSPITAL_COMMUNITY): Payer: Federal, State, Local not specified - PPO

## 2014-07-23 ENCOUNTER — Encounter (HOSPITAL_COMMUNITY): Payer: Federal, State, Local not specified - PPO

## 2014-07-25 ENCOUNTER — Encounter (HOSPITAL_COMMUNITY): Payer: Federal, State, Local not specified - PPO

## 2014-07-27 ENCOUNTER — Encounter (HOSPITAL_COMMUNITY): Payer: Federal, State, Local not specified - PPO

## 2014-07-30 ENCOUNTER — Encounter (HOSPITAL_COMMUNITY): Payer: Federal, State, Local not specified - PPO

## 2014-08-01 ENCOUNTER — Encounter (HOSPITAL_COMMUNITY): Payer: Federal, State, Local not specified - PPO

## 2014-08-03 ENCOUNTER — Encounter (HOSPITAL_COMMUNITY): Payer: Federal, State, Local not specified - PPO

## 2014-08-06 ENCOUNTER — Encounter (HOSPITAL_COMMUNITY): Payer: Federal, State, Local not specified - PPO

## 2014-08-08 ENCOUNTER — Encounter (HOSPITAL_COMMUNITY): Payer: Federal, State, Local not specified - PPO

## 2014-08-10 ENCOUNTER — Encounter (HOSPITAL_COMMUNITY): Payer: Federal, State, Local not specified - PPO

## 2014-08-13 ENCOUNTER — Encounter (HOSPITAL_COMMUNITY): Payer: Federal, State, Local not specified - PPO

## 2014-08-15 ENCOUNTER — Encounter (HOSPITAL_COMMUNITY): Payer: Federal, State, Local not specified - PPO

## 2014-08-17 ENCOUNTER — Encounter (HOSPITAL_COMMUNITY): Payer: Federal, State, Local not specified - PPO

## 2014-08-20 ENCOUNTER — Encounter (HOSPITAL_COMMUNITY): Payer: Federal, State, Local not specified - PPO

## 2014-08-22 ENCOUNTER — Encounter (HOSPITAL_COMMUNITY): Payer: Federal, State, Local not specified - PPO

## 2014-08-24 ENCOUNTER — Encounter (HOSPITAL_COMMUNITY): Payer: Federal, State, Local not specified - PPO

## 2014-08-27 ENCOUNTER — Encounter (HOSPITAL_COMMUNITY): Payer: Federal, State, Local not specified - PPO

## 2014-08-29 ENCOUNTER — Encounter (HOSPITAL_COMMUNITY): Payer: Federal, State, Local not specified - PPO

## 2014-08-31 ENCOUNTER — Encounter (HOSPITAL_COMMUNITY): Payer: Federal, State, Local not specified - PPO

## 2014-09-03 ENCOUNTER — Encounter (HOSPITAL_COMMUNITY): Payer: Federal, State, Local not specified - PPO

## 2015-09-04 ENCOUNTER — Ambulatory Visit (INDEPENDENT_AMBULATORY_CARE_PROVIDER_SITE_OTHER): Payer: Federal, State, Local not specified - PPO | Admitting: Internal Medicine

## 2015-09-04 VITALS — BP 150/100 | HR 80 | Temp 97.3°F | Resp 16 | Ht 69.0 in | Wt 160.0 lb

## 2015-09-04 DIAGNOSIS — IMO0001 Reserved for inherently not codable concepts without codable children: Secondary | ICD-10-CM

## 2015-09-04 DIAGNOSIS — F411 Generalized anxiety disorder: Secondary | ICD-10-CM | POA: Diagnosis not present

## 2015-09-04 DIAGNOSIS — S86011A Strain of right Achilles tendon, initial encounter: Secondary | ICD-10-CM | POA: Diagnosis not present

## 2015-09-04 DIAGNOSIS — R030 Elevated blood-pressure reading, without diagnosis of hypertension: Secondary | ICD-10-CM

## 2015-09-04 MED ORDER — FLUOXETINE HCL 40 MG PO CAPS: 40.0000 mg | ORAL_CAPSULE | Freq: Every day | ORAL | Status: DC

## 2015-09-04 NOTE — Progress Notes (Signed)
Subjective:  By signing my name below, I, Edwin Mckee, attest that this documentation has been prepared under the direction and in the presence of Tami Lin, MD. Electronically Signed: Moises Mckee, [place]. 09/04/2015 , 3:01 PM .  Patient was seen in Room 9 .   Patient ID: Edwin Mckee, male    DOB: 06/03/1945, 70 y.o.   MRN: [id number] Chief Complaint  Patient presents with  . Ankle Pain    Right side. NKI   HPI Edwin Mckee is a 71 y.o. male who presents to [facility] complaining of right ankle pain ongoing intermittently for about a month now. Patient states he's been continuously jogging 3 miles in his neighborhood for years. However, he woke up in the morning about a month ago with right foot soreness and couldn't walk. He noted it was swollen and bruised, worsening over that weekend. He treated the symptom with ice and without bearing weight on the area. He used crutches to walk around.   He went to the beach about a week after that episode. He was walking briefly in the sand. He felt the same pain again and treated with the same previous regime of ice and rest. He informs playing 18 hole golf a few days ago and walked around. His right ankle pain returned. He called [employer] but wasn't able to be seen this week.   He also requests medication refill of his prozac. He has some personal stress at home. He's been trying to work on managing his anxiety with meditation.   He has history of heart attack in February 2016 and had a stent placed. He recently seen his cardiologist, Dr. Einar Gip, 2 weeks ago. He was taken off of his BP medication.   Patient Active Problem List   Diagnosis Date Noted  . NSTEMI (non-ST elevated myocardial infarction) ([place]) 04/25/2014  . Hearing loss 05/04/2012  . HEMORRHOIDS-EXTERNAL 01/31/2008  . RECTAL BLEEDING 01/31/2008  . FECAL OCCULT Mckee 01/31/2008  . HYPERLIPIDEMIA 01/26/2008  . HYPERTENSION 01/26/2008  . HEMORRHOIDS 01/26/2008    . INGUINAL HERNIA, HX OF 01/26/2008    Current outpatient prescriptions:  .  aspirin 81 MG tablet, Take 81 mg by mouth daily., Disp: , Rfl:  .  atorvastatin (LIPITOR) 80 MG tablet, Take 1 tablet (80 mg total) by mouth daily at 6 PM., Disp: 30 tablet, Rfl: 0 .  diphenhydrAMINE (BENADRYL) 25 MG tablet, Take 25 mg by mouth at bedtime as needed., Disp: , Rfl:  .  FLUoxetine (PROZAC) 40 MG capsule, Take 1 capsule (40 mg total) by mouth daily., Disp: , Rfl: 1 .  ticagrelor (BRILINTA) 90 MG TABS tablet, Take 1 tablet (90 mg total) by mouth 2 (two) times daily., Disp: 60 tablet, Rfl: 0 .  atenolol (TENORMIN) 25 MG tablet, Take 0.5 tablets (12.5 mg total) by mouth daily. (Patient not taking: Reported on 09/04/2015), Disp: 15 tablet, Rfl: 0 .  temazepam (RESTORIL) 7.5 MG capsule, Take 7.5 mg by mouth at bedtime as needed for sleep. Reported on 09/04/2015, Disp: , Rfl:  No Known Allergies   Review of Systems  Constitutional: Positive for activity change. Negative for fever, chills and fatigue.  Gastrointestinal: Negative for nausea, vomiting and diarrhea.  Musculoskeletal: Positive for myalgias, joint swelling, arthralgias and gait problem.  Neurological: Negative for weakness and numbness.      Objective:   Physical Exam  Constitutional: He is oriented to person, place, and time. He appears well-developed and well-nourished. No distress.  HENT:  Head: Normocephalic and atraumatic.  Eyes: EOM are normal. Pupils are equal, round, and reactive to light.  Neck: Neck supple.  Cardiovascular: Normal rate.   Pulmonary/Chest: Effort normal. No respiratory distress.  Musculoskeletal: Normal range of motion.  Right foot: achilles has resolved ecchymosis 2-3cm above the insertion into calcaneus, the tendon is tender from insertion about 5-6cm above but thompson's test proves at least partially intact, ankle rom otherwise intact  Neurological: He is alert and oriented to person, place, and time.  Skin:  Skin is warm and dry.  Psychiatric: He has a normal mood and affect. His behavior is normal.  Nursing note and vitals reviewed.  BP 150/100 mmHg  Pulse 80  Temp(Src) 97.3 F (36.3 C) (Oral)  Resp 16  Ht 5\' 9"  (1.753 m)  Wt 160 lb (72.576 kg)  BMI 23.62 kg/m2  SpO2 99%    Assessment & Plan:  Achilles rupture-partial, right, initial encounter - Plan: Ambulatory referral to Sports Medicine  Elevated BP-? Due to recent stressors  GAD (generalized anxiety disorder)--better w/ prozac restart so will continue  Meds ordered this encounter  Medications  . FLUoxetine (PROZAC) 40 MG capsule    Sig: Take 1 capsule (40 mg total) by mouth daily.    Dispense:  90 capsule    Refill:  3    I have completed the patient encounter in its entirety as documented by the scribe, with editing by me where necessary. Alece Koppel P. Laney Pastor, M.D.

## 2015-09-04 NOTE — Patient Instructions (Signed)
     IF you received an x-ray today, you will receive an invoice from [facility]. Please contact [facility] at [phone] with questions or concerns regarding your invoice.   IF you received labwork today, you will receive an invoice from [facility]. Please contact [facility] at [phone] with questions or concerns regarding your invoice.   Our billing staff will not be able to assist you with questions regarding bills from these companies.  You will be contacted with the lab results as soon as they are available. The fastest way to get your results is to activate your My Chart account. Instructions are located on the last page of this paperwork. If you have not heard from us regarding the results in 2 weeks, please contact this office.      

## 2015-09-10 ENCOUNTER — Encounter: Payer: Self-pay | Admitting: Family Medicine

## 2015-09-10 ENCOUNTER — Ambulatory Visit (INDEPENDENT_AMBULATORY_CARE_PROVIDER_SITE_OTHER): Payer: Federal, State, Local not specified - PPO | Admitting: Family Medicine

## 2015-09-10 VITALS — BP 172/112 | HR 69 | Ht 70.0 in | Wt 160.0 lb

## 2015-09-10 DIAGNOSIS — M7661 Achilles tendinitis, right leg: Secondary | ICD-10-CM

## 2015-09-10 MED ORDER — NITROGLYCERIN 0.2 MG/HR TD PT24: MEDICATED_PATCH | TRANSDERMAL | Status: DC

## 2015-09-10 NOTE — Patient Instructions (Signed)
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 Ibuprofen 600mg  three times a day with food OR aleve 2 tabs twice a day with food for pain and inflammation as needed. Calf raises 3 sets of 10 on level ground once a day first. When these are easy, can do them one legged 3 sets of 10. Finally advance to doing them on a step. Can add heel walks, toe walks forward and backward as well Ice bucket 10-15 minutes at end of day - can ice 3-4 times a day. Avoid uneven ground, hills as much as possible. Heel lifts in shoes or shoes with a natural heel lift. Nitro patches 1/4th patch over affected achilles, change daily. Consider physical therapy, orthotics if not improving as expected. Follow up in 6 weeks.

## 2015-09-12 DIAGNOSIS — M7660 Achilles tendinitis, unspecified leg: Secondary | ICD-10-CM | POA: Insufficient documentation

## 2015-09-12 NOTE — Progress Notes (Signed)
PCP: Dr. Laney Pastor  Subjective:   HPI: Patient is a 70 y.o. male here for right heel pain.  Patient reports he's had pain in right heel since 5/12. Typically runs 3 miles 4-5 times a week. Also golfs for exercise. Noticed pain for the first time back then when he got out of the car following golfing. Has been using crutches at times. Pain 2/10, worse with exercise, sharp and posterior. Tried icing, ibuprofen. Swelling locally.  No skin changes, numbness.  Past Medical History  Diagnosis Date  . Hypertension   . Arthritis   . Depression   . Substance abuse   . NSTEMI (non-ST elevated myocardial infarction) ([place]) 04/25/2014    Current Outpatient Prescriptions on File Prior to Visit  Medication Sig Dispense Refill  . aspirin 81 MG tablet Take 81 mg by mouth daily.    Marland Kitchen atenolol (TENORMIN) 25 MG tablet Take 0.5 tablets (12.5 mg total) by mouth daily. (Patient not taking: Reported on 09/04/2015) 15 tablet 0  . atorvastatin (LIPITOR) 80 MG tablet Take 1 tablet (80 mg total) by mouth daily at 6 PM. 30 tablet 0  . diphenhydrAMINE (BENADRYL) 25 MG tablet Take 25 mg by mouth at bedtime as needed.    Marland Kitchen FLUoxetine (PROZAC) 40 MG capsule Take 1 capsule (40 mg total) by mouth daily.  1  . FLUoxetine (PROZAC) 40 MG capsule Take 1 capsule (40 mg total) by mouth daily. 90 capsule 3  . temazepam (RESTORIL) 7.5 MG capsule Take 7.5 mg by mouth at bedtime as needed for sleep. Reported on 09/04/2015    . ticagrelor (BRILINTA) 90 MG TABS tablet Take 1 tablet (90 mg total) by mouth 2 (two) times daily. 60 tablet 0   No current facility-administered medications on file prior to visit.    Past Surgical History  Procedure Laterality Date  . Hernia repair    . Vasectomy    . Left heart catheterization with coronary angiogram N/A 04/25/2014    Procedure: LEFT HEART CATHETERIZATION WITH CORONARY ANGIOGRAM;  Surgeon: Laverda Page, MD;  Location: [facility] CATH LAB;  Service: Cardiovascular;  Laterality: N/A;     No Known Allergies  Social History   Social History  . Marital Status: Married    Spouse Name: N/A  . Number of Children: N/A  . Years of Education: N/A   Occupational History  . Not on file.   Social History Main Topics  . Smoking status: Former Smoker    Quit date: 04/12/1982  . Smokeless tobacco: Not on file  . Alcohol Use: No  . Drug Use: No  . Sexual Activity: Yes   Other Topics Concern  . Not on file   Social History Narrative    Family History  Problem Relation Age of Onset  . Cancer Mother     lung  . Hypertension Father   . Alzheimer's disease Father   . Heart disease Sister   . Heart disease Brother   . Stroke Maternal Grandmother   . Alcohol abuse Maternal Grandfather   . Cancer Maternal Grandfather   . Stroke Paternal Grandmother   . Cancer Paternal Grandfather     BP 172/112 mmHg  Pulse 69  Ht 5\' 10"  (1.778 m)  Wt 160 lb (72.576 kg)  BMI 22.96 kg/m2  Review of Systems: See HPI above.    Objective:  Physical Exam:  Gen: NAD, comfortable in exam room  Right foot/ankle: Haglund's deformity.  No other swelling, bruising, deformity. FROM with 5/5 strength all  directions. TTP insertion of achilles on calcaneus. Negative ant drawer and talar tilt.   Negative syndesmotic compression. Negative calcaneal squeeze. Thompsons test negative. NV intact distally.    MSK u/s:  Insertional calcifications right achilles.  No neovascularity.  Greatest depth 0.81cm.  Assessment & Plan:  1. Right insertional achilles tendinitis - Shown home exercises to do and how to advance these.  Icing, heel lifts.  Start nitro patches - discussed risks of headaches, skin irritation.  Consider physical therapy, orthotics if not improving.  F/u in 6 weeks.

## 2015-09-12 NOTE — Assessment & Plan Note (Signed)
Right insertional achilles tendinitis - Shown home exercises to do and how to advance these.  Icing, heel lifts.  Start nitro patches - discussed risks of headaches, skin irritation.  Consider physical therapy, orthotics if not improving.  F/u in 6 weeks.

## 2015-09-18 ENCOUNTER — Ambulatory Visit: Payer: Federal, State, Local not specified - PPO | Admitting: Sports Medicine

## 2015-10-23 ENCOUNTER — Ambulatory Visit (INDEPENDENT_AMBULATORY_CARE_PROVIDER_SITE_OTHER): Payer: Federal, State, Local not specified - PPO | Admitting: Family Medicine

## 2015-10-23 ENCOUNTER — Ambulatory Visit: Payer: Federal, State, Local not specified - PPO | Admitting: Family Medicine

## 2015-10-23 ENCOUNTER — Encounter: Payer: Self-pay | Admitting: Family Medicine

## 2015-10-23 DIAGNOSIS — M7661 Achilles tendinitis, right leg: Secondary | ICD-10-CM | POA: Diagnosis not present

## 2015-10-24 NOTE — Assessment & Plan Note (Signed)
Right insertional achilles tendinitis - Mildly improved from last visit.  Continue home exercises, heel lifts, icing.  He will consider increasing nitro patches to 1/2 patch daily.  Also consider physical therapy, orthotics.  F/u in 6 weeks.

## 2015-10-24 NOTE — Progress Notes (Signed)
PCP: Dr. Laney Pastor  Subjective:   HPI: Patient is a 69 y.o. male here for right heel pain.  6/27: Patient reports he's had pain in right heel since 5/12. Typically runs 3 miles 4-5 times a week. Also golfs for exercise. Noticed pain for the first time back then when he got out of the car following golfing. Has been using crutches at times. Pain 2/10, worse with exercise, sharp and posterior. Tried icing, ibuprofen. Swelling locally.  No skin changes, numbness.  8/9: Patient reports he feels about the same, maybe slightly better compared to last visit. He has been using nitro patches, doing home exercises. Using heel lifts when walking. Able to walk 2 miles now without many problems. Pain is currently 0/10, more a discomfort. No skin changes, numbness.  Past Medical History:  Diagnosis Date  . Arthritis   . Depression   . Hypertension   . NSTEMI (non-ST elevated myocardial infarction) ([place]) 04/25/2014  . Substance abuse     Current Outpatient Prescriptions on File Prior to Visit  Medication Sig Dispense Refill  . aspirin 81 MG tablet Take 81 mg by mouth daily.    Marland Kitchen atenolol (TENORMIN) 25 MG tablet Take 0.5 tablets (12.5 mg total) by mouth daily. (Patient not taking: Reported on 09/04/2015) 15 tablet 0  . atorvastatin (LIPITOR) 80 MG tablet Take 1 tablet (80 mg total) by mouth daily at 6 PM. 30 tablet 0  . diphenhydrAMINE (BENADRYL) 25 MG tablet Take 25 mg by mouth at bedtime as needed.    Marland Kitchen FLUoxetine (PROZAC) 40 MG capsule Take 1 capsule (40 mg total) by mouth daily.  1  . FLUoxetine (PROZAC) 40 MG capsule Take 1 capsule (40 mg total) by mouth daily. 90 capsule 3  . nitroGLYCERIN (NITRODUR - DOSED IN MG/24 HR) 0.2 mg/hr patch Apply 1/4th patch over affected achilles, change daily 30 patch 1  . nitroGLYCERIN (NITROSTAT) 0.4 MG SL tablet 1 (ONE) TABLET UNDER TONGUE EVERY 5 MINUTES AS NEEDED FOR CHEST PAIN.  1  . temazepam (RESTORIL) 7.5 MG capsule Take 7.5 mg by mouth at  bedtime as needed for sleep. Reported on 09/04/2015    . ticagrelor (BRILINTA) 90 MG TABS tablet Take 1 tablet (90 mg total) by mouth 2 (two) times daily. 60 tablet 0   No current facility-administered medications on file prior to visit.     Past Surgical History:  Procedure Laterality Date  . HERNIA REPAIR    . LEFT HEART CATHETERIZATION WITH CORONARY ANGIOGRAM N/A 04/25/2014   Procedure: LEFT HEART CATHETERIZATION WITH CORONARY ANGIOGRAM;  Surgeon: Laverda Page, MD;  Location: [facility] CATH LAB;  Service: Cardiovascular;  Laterality: N/A;  . VASECTOMY      No Known Allergies  Social History   Social History  . Marital status: Married    Spouse name: N/A  . Number of children: N/A  . Years of education: N/A   Occupational History  . Not on file.   Social History Main Topics  . Smoking status: Former Smoker    Quit date: 04/12/1982  . Smokeless tobacco: Never Used  . Alcohol use No  . Drug use: No  . Sexual activity: Yes   Other Topics Concern  . Not on file   Social History Narrative  . No narrative on file    Family History  Problem Relation Age of Onset  . Cancer Mother     lung  . Hypertension Father   . Alzheimer's disease Father   . Heart  disease Sister   . Heart disease Brother   . Stroke Maternal Grandmother   . Alcohol abuse Maternal Grandfather   . Cancer Maternal Grandfather   . Stroke Paternal Grandmother   . Cancer Paternal Grandfather     BP (!) 146/91   Pulse 61   Ht 5\' 10"  (1.778 m)   Wt 160 lb (72.6 kg)   BMI 22.96 kg/m   Review of Systems: See HPI above.    Objective:  Physical Exam:  Gen: NAD, comfortable in exam room  Right foot/ankle: Haglund's deformity.  No other swelling, bruising, deformity. FROM with 5/5 strength all directions. Mild TTP insertion of achilles on calcaneus. Negative ant drawer and talar tilt.   Negative syndesmotic compression. Negative calcaneal squeeze. Thompsons test negative. NV intact  distally.  Assessment & Plan:  1. Right insertional achilles tendinitis - Mildly improved from last visit.  Continue home exercises, heel lifts, icing.  He will consider increasing nitro patches to 1/2 patch daily.  Also consider physical therapy, orthotics.  F/u in 6 weeks.

## 2015-11-29 ENCOUNTER — Other Ambulatory Visit: Payer: Self-pay | Admitting: *Deleted

## 2015-11-29 ENCOUNTER — Other Ambulatory Visit: Payer: Self-pay | Admitting: Family Medicine

## 2016-03-28 ENCOUNTER — Ambulatory Visit (INDEPENDENT_AMBULATORY_CARE_PROVIDER_SITE_OTHER): Payer: Federal, State, Local not specified - PPO | Admitting: Physician Assistant

## 2016-03-28 VITALS — BP 122/72 | HR 68 | Temp 98.3°F | Resp 17 | Ht 70.5 in | Wt 171.0 lb

## 2016-03-28 DIAGNOSIS — R0981 Nasal congestion: Secondary | ICD-10-CM

## 2016-03-28 DIAGNOSIS — J181 Lobar pneumonia, unspecified organism: Secondary | ICD-10-CM

## 2016-03-28 DIAGNOSIS — J189 Pneumonia, unspecified organism: Secondary | ICD-10-CM

## 2016-03-28 DIAGNOSIS — J029 Acute pharyngitis, unspecified: Secondary | ICD-10-CM | POA: Diagnosis not present

## 2016-03-28 DIAGNOSIS — R05 Cough: Secondary | ICD-10-CM | POA: Diagnosis not present

## 2016-03-28 DIAGNOSIS — R059 Cough, unspecified: Secondary | ICD-10-CM

## 2016-03-28 MED ORDER — AZITHROMYCIN 250 MG PO TABS: ORAL_TABLET | ORAL | 0 refills | Status: DC

## 2016-03-28 MED ORDER — BENZONATATE 100 MG PO CAPS: 100.0000 mg | ORAL_CAPSULE | Freq: Three times a day (TID) | ORAL | 0 refills | Status: DC | PRN

## 2016-03-28 NOTE — Progress Notes (Signed)
Edwin Mckee  MRN: [id number] DOB: Dec 12, 1945  PCP: No PCP Per Patient  Subjective:  Pt is a 71 year old male PMH HTN, NSTEMI, HLD who presents to clinic for cough, nasal congestion and sore throat x 10 days.  Productive cough. A few days ago he started coughing up a little bit of blood. Blowing out some blood with nose blowing.  Last night was the worst night he has had as far as being kept up at night due to cough. Overall he feels like he is getting better. +sore throat. Denies SOB, fever, chills, wheezing, chest pain, chest pressure, palpitations, headache, dizziness, muscle aches. Has tried Mucinex DM - not helping.  Has had his flu shot this season.   Review of Systems  Constitutional: Negative for chills, diaphoresis, fatigue and fever.  HENT: Positive for congestion, postnasal drip, rhinorrhea and sore throat.   Respiratory: Positive for cough. Negative for chest tightness, shortness of breath and wheezing.   Cardiovascular: Negative for chest pain, palpitations and leg swelling.  Gastrointestinal: Negative for diarrhea, nausea and vomiting.  Neurological: Negative for dizziness, syncope, light-headedness and headaches.  Psychiatric/Behavioral: Positive for sleep disturbance.    Patient Active Problem List   Diagnosis Date Noted  . Achilles tendinitis 09/12/2015  . NSTEMI (non-ST elevated myocardial infarction) ([place]) 04/25/2014  . Hearing loss 05/04/2012  . HEMORRHOIDS-EXTERNAL 01/31/2008  . RECTAL BLEEDING 01/31/2008  . FECAL OCCULT BLOOD 01/31/2008  . HYPERLIPIDEMIA 01/26/2008  . HYPERTENSION 01/26/2008  . HEMORRHOIDS 01/26/2008  . INGUINAL HERNIA, HX OF 01/26/2008    Current Outpatient Prescriptions on File Prior to Visit  Medication Sig Dispense Refill  . aspirin 81 MG tablet Take 81 mg by mouth daily.    Marland Kitchen atorvastatin (LIPITOR) 80 MG tablet Take 1 tablet (80 mg total) by mouth daily at 6 PM. 30 tablet 0  . diphenhydrAMINE (BENADRYL) 25 MG tablet Take 25 mg by  mouth at bedtime as needed.    Marland Kitchen FLUoxetine (PROZAC) 40 MG capsule Take 1 capsule (40 mg total) by mouth daily.  1  . FLUoxetine (PROZAC) 40 MG capsule Take 1 capsule (40 mg total) by mouth daily. 90 capsule 3  . nitroGLYCERIN (NITRODUR - DOSED IN MG/24 HR) 0.2 mg/hr patch Apply 1/4th patch over affected achilles, change daily 30 patch 1  . nitroGLYCERIN (NITROSTAT) 0.4 MG SL tablet 1 (ONE) TABLET UNDER TONGUE EVERY 5 MINUTES AS NEEDED FOR CHEST PAIN.  1   No current facility-administered medications on file prior to visit.     No Known Allergies   Objective:  BP 122/72 (BP Location: Right Arm, Patient Position: Sitting, Cuff Size: Normal)   Pulse 68   Temp 98.3 F (36.8 C) (Oral)   Resp 17   Ht 5' 10.5" (1.791 m)   Wt 171 lb (77.6 kg)   SpO2 98%   BMI 24.19 kg/m   Physical Exam  Constitutional: He is oriented to person, place, and time and well-developed, well-nourished, and in no distress. No distress.  HENT:  Right Ear: Tympanic membrane normal.  Left Ear: Tympanic membrane normal.  Mouth/Throat: Oropharynx is clear and moist and mucous membranes are normal.  Cardiovascular: Normal rate, regular rhythm and normal heart sounds.   Pulmonary/Chest: Effort normal. He has no wheezes. He has rales in the right lower field.  Neurological: He is alert and oriented to person, place, and time. GCS score is 15.  Skin: Skin is warm and dry.  Psychiatric: Mood, memory, affect and judgment normal.  Vitals reviewed.   Assessment and Plan :  1. Community acquired pneumonia of right lower lobe of lung ([place]) 2. Cough 3. Nasal congestion 4. Sore throat - azithromycin (ZITHROMAX) 250 MG tablet; Take 2 tabs PO x 1 dose, then 1 tab PO QD x 4 days  Dispense: 6 tablet; Refill: 0 - benzonatate (TESSALON) 100 MG capsule; Take 1-2 capsules (100-200 mg total) by mouth 3 (three) times daily as needed for cough.  Dispense: 40 capsule; Refill: 0 - Supportive care: Push fluids, rest, warm tea with  honey. RTC in 5-7 days if no improvement.   Mercer Pod, PA-C  [facility] 03/28/2016 8:49 AM

## 2016-03-28 NOTE — Patient Instructions (Addendum)
Delsym for cough. Please stay well hydrated.  Warm tea with honey is just as effective as cough drops. Please come back in 5-7 days if you are not feeling better.  Thank you for coming in today. I hope you feel we met your needs.  Feel free to call [facility] if you have any questions or further requests.  Please consider signing up for MyChart if you do not already have it, as this is a great way to communicate with me.  Best,  Whitney McVey, PA-C   IF you received an x-ray today, you will receive an invoice from [facility]. Please contact [facility] at [phone] with questions or concerns regarding your invoice.   IF you received labwork today, you will receive an invoice from [facility]. Please contact [facility] at [phone] with questions or concerns regarding your invoice.   Our billing staff will not be able to assist you with questions regarding bills from these companies.  You will be contacted with the lab results as soon as they are available. The fastest way to get your results is to activate your My Chart account. Instructions are located on the last page of this paperwork. If you have not heard from [place] regarding the results in 2 weeks, please contact this office.

## 2016-04-03 ENCOUNTER — Ambulatory Visit (INDEPENDENT_AMBULATORY_CARE_PROVIDER_SITE_OTHER): Payer: Federal, State, Local not specified - PPO | Admitting: Family Medicine

## 2016-04-03 VITALS — BP 140/70 | HR 84 | Temp 97.8°F | Resp 16 | Ht 70.0 in | Wt 166.6 lb

## 2016-04-03 DIAGNOSIS — M79675 Pain in left toe(s): Secondary | ICD-10-CM

## 2016-04-03 DIAGNOSIS — J181 Lobar pneumonia, unspecified organism: Secondary | ICD-10-CM

## 2016-04-03 DIAGNOSIS — L309 Dermatitis, unspecified: Secondary | ICD-10-CM

## 2016-04-03 DIAGNOSIS — I214 Non-ST elevation (NSTEMI) myocardial infarction: Secondary | ICD-10-CM

## 2016-04-03 DIAGNOSIS — I10 Essential (primary) hypertension: Secondary | ICD-10-CM | POA: Diagnosis not present

## 2016-04-03 DIAGNOSIS — J189 Pneumonia, unspecified organism: Secondary | ICD-10-CM

## 2016-04-03 NOTE — Patient Instructions (Addendum)
Follow up with Dr. Agustina Caroli for physical exam       IF you received an x-ray today, you will receive an invoice from [facility]. Please contact [facility] at [phone] with questions or concerns regarding your invoice.   IF you received labwork today, you will receive an invoice from [facility]. Please contact [facility] at [phone] with questions or concerns regarding your invoice.   Our billing staff will not be able to assist you with questions regarding bills from these companies.  You will be contacted with the lab results as soon as they are available. The fastest way to get your results is to activate your My Chart account. Instructions are located on the last page of this paperwork. If you have not heard from [place] regarding the results in 2 weeks, please contact this office.     Upper Respiratory Infection, Adult Most upper respiratory infections (URIs) are caused by a virus. A URI affects the nose, throat, and upper air passages. The most common type of URI is often called "the common cold." Follow these instructions at home:  Take medicines only as told by your doctor.  Gargle warm saltwater or take cough drops to comfort your throat as told by your doctor.  Use a warm mist humidifier or inhale steam from a shower to increase air moisture. This may make it easier to breathe.  Drink enough fluid to keep your pee (urine) clear or pale yellow.  Eat soups and other clear broths.  Have a healthy diet.  Rest as needed.  Go back to work when your fever is gone or your doctor says it is okay.  You may need to stay home longer to avoid giving your URI to others.  You can also wear a face mask and wash your hands often to prevent spread of the virus.  Use your inhaler more if you have asthma.  Do not use any tobacco products, including cigarettes, chewing tobacco, or electronic cigarettes. If you need help quitting, ask your doctor. Contact a  doctor if:  You are getting worse, not better.  Your symptoms are not helped by medicine.  You have chills.  You are getting more short of breath.  You have brown or red mucus.  You have yellow or brown discharge from your nose.  You have pain in your face, especially when you bend forward.  You have a fever.  You have puffy (swollen) neck glands.  You have pain while swallowing.  You have white areas in the back of your throat. Get help right away if:  You have very bad or constant:  Headache.  Ear pain.  Pain in your forehead, behind your eyes, and over your cheekbones (sinus pain).  Chest pain.  You have long-lasting (chronic) lung disease and any of the following:  Wheezing.  Long-lasting cough.  Coughing up blood.  A change in your usual mucus.  You have a stiff neck.  You have changes in your:  Vision.  Hearing.  Thinking.  Mood. This information is not intended to replace advice given to you by your health care provider. Make sure you discuss any questions you have with your health care provider. Document Released: 08/19/2007 Document Revised: 11/03/2015 Document Reviewed: 06/07/2013 Elsevier Interactive Patient Education  2017 [employer].

## 2016-04-03 NOTE — Progress Notes (Signed)
Chief Complaint  Patient presents with  . Follow-up    from Pneumonia, pt states "feel a little better"    HPI  Acute URI  Pt reports that he completed his zpak and does not feel like it was sufficient He completed his zpak 2 days ago Reports that he is currently coughing with productive cough that is yellow mostly He denies fevers or chills He is blowing his nose and also getting yellowish cough  Hypertension, Skin Rash and Arthalgia  He reports that he also has concerns about hypertension meds - valsartan He reports that his bp was not adequately controlled and was given a trial of valsartan He monitored his bp for 2 weeks and the dosage was increased He was taking 1.5 tabs of the 160mg  valsartan After increasing his dose he noted a rash on his back and his left great toe started hurting and swelling He reports that he was taking the 1.5 tablets for 5 days so he stopped taking his valsartan altogether and the toe pain is improved but the swelling is still there. The rash is resolved since stopping the valsartan.  He reports that off his blood pressure medications he has bp in the 140s/70s. BP Readings from Last 3 Encounters:  04/03/16 140/70  03/28/16 122/72  10/23/15 (!) 146/91   Rash- He reports that he had a rash on his back that looked like red spots that were initially erythematous but resolved. Denies any rash in the oral or genital region. Only on his back. It resolved once he started his valsartan.   Past Medical History:  Diagnosis Date  . Arthritis   . Depression   . Hypertension   . NSTEMI (non-ST elevated myocardial infarction) ([place]) 04/25/2014  . Substance abuse     Current Outpatient Prescriptions  Medication Sig Dispense Refill  . aspirin 81 MG tablet Take 81 mg by mouth daily.    Marland Kitchen atorvastatin (LIPITOR) 80 MG tablet Take 1 tablet (80 mg total) by mouth daily at 6 PM. 30 tablet 0  . FLUoxetine (PROZAC) 40 MG capsule Take 1 capsule (40 mg total) by mouth  daily.  1  . benzonatate (TESSALON) 100 MG capsule Take 1-2 capsules (100-200 mg total) by mouth 3 (three) times daily as needed for cough. (Patient not taking: Reported on 04/03/2016) 40 capsule 0  . diphenhydrAMINE (BENADRYL) 25 MG tablet Take 25 mg by mouth at bedtime as needed.    . nitroGLYCERIN (NITRODUR - DOSED IN MG/24 HR) 0.2 mg/hr patch Apply 1/4th patch over affected achilles, change daily (Patient not taking: Reported on 04/03/2016) 30 patch 1  . nitroGLYCERIN (NITROSTAT) 0.4 MG SL tablet 1 (ONE) TABLET UNDER TONGUE EVERY 5 MINUTES AS NEEDED FOR CHEST PAIN.  1   No current facility-administered medications for this visit.     Allergies: No Known Allergies  Past Surgical History:  Procedure Laterality Date  . HERNIA REPAIR    . LEFT HEART CATHETERIZATION WITH CORONARY ANGIOGRAM N/A 04/25/2014   Procedure: LEFT HEART CATHETERIZATION WITH CORONARY ANGIOGRAM;  Surgeon: Laverda Page, MD;  Location: [facility] CATH LAB;  Service: Cardiovascular;  Laterality: N/A;  . VASECTOMY      Social History   Social History  . Marital status: Married    Spouse name: N/A  . Number of children: N/A  . Years of education: N/A   Social History Main Topics  . Smoking status: Former Smoker    Quit date: 04/12/1982  . Smokeless tobacco: Never Used  .  Alcohol use No  . Drug use: No  . Sexual activity: Yes   Other Topics Concern  . None   Social History Narrative  . None    ROS SEE HPI   Objective: Vitals:   04/03/16 1140  BP: 140/70  Pulse: 84  Resp: 16  Temp: 97.8 F (36.6 C)  TempSrc: Oral  SpO2: 98%  Weight: 166 lb 9.6 oz (75.6 kg)  Height: 5\' 10"  (1.778 m)    Physical Exam General: alert, oriented, in NAD Head: normocephalic, atraumatic, no sinus tenderness Eyes: EOM intact, no scleral icterus or conjunctival injection Ears: TM clear bilaterally Throat: no pharyngeal exudate or erythema Lymph: no posterior auricular, submental or cervical lymph adenopathy Heart:  normal rate, normal sinus rhythm, no murmurs Lungs: clear to auscultation bilaterally, no wheezing Great toe on the left with mild edema and trace erythema Skin: hyperpigmented lesions on the torso that are drying up, no vesicles, no bullae  Assessment and Plan Ej was seen today for follow-up.  Diagnoses and all orders for this visit:  Community acquired pneumonia of right lower lobe of lung ([place])- completed zpak, would not   Essential hypertension- bp elevated from baseline off valsartan Advised to discuss with Cardiology  NSTEMI (non-ST elevated myocardial infarction) [facility])- continue all the other heart meds   Pain of left great toe- uncertain if this is gout without fluid analysis or blood testing Could be arthralgia Its rate of improvement (resolved in 3 days) makes this less likely gout  Dermatitis- resolved since stopping the medication, could have been a drug reaction but since pt was also on antibiotics it is unclear which medication was involved.      [place]

## 2016-12-16 ENCOUNTER — Ambulatory Visit (INDEPENDENT_AMBULATORY_CARE_PROVIDER_SITE_OTHER): Payer: Federal, State, Local not specified - PPO | Admitting: Emergency Medicine

## 2016-12-16 ENCOUNTER — Encounter: Payer: Self-pay | Admitting: Emergency Medicine

## 2016-12-16 VITALS — BP 120/88 | HR 78 | Temp 97.7°F | Resp 16 | Ht 69.0 in | Wt 169.0 lb

## 2016-12-16 DIAGNOSIS — Z23 Encounter for immunization: Secondary | ICD-10-CM | POA: Diagnosis not present

## 2016-12-16 DIAGNOSIS — Z8659 Personal history of other mental and behavioral disorders: Secondary | ICD-10-CM | POA: Insufficient documentation

## 2016-12-16 DIAGNOSIS — I259 Chronic ischemic heart disease, unspecified: Secondary | ICD-10-CM | POA: Diagnosis not present

## 2016-12-16 DIAGNOSIS — Z0000 Encounter for general adult medical examination without abnormal findings: Secondary | ICD-10-CM | POA: Diagnosis not present

## 2016-12-16 DIAGNOSIS — I10 Essential (primary) hypertension: Secondary | ICD-10-CM

## 2016-12-16 DIAGNOSIS — I252 Old myocardial infarction: Secondary | ICD-10-CM

## 2016-12-16 MED ORDER — FLUOXETINE HCL 40 MG PO CAPS: 40.0000 mg | ORAL_CAPSULE | Freq: Every day | ORAL | 3 refills | Status: DC

## 2016-12-16 NOTE — Patient Instructions (Addendum)
IF you received an x-ray today, you will receive an invoice from [facility]. Please contact [facility] at [phone] with questions or concerns regarding your invoice.   IF you received labwork today, you will receive an invoice from [place]. Please contact [facility] at [phone] with questions or concerns regarding your invoice.   Our billing staff will not be able to assist you with questions regarding bills from these companies.  You will be contacted with the lab results as soon as they are available. The fastest way to get your results is to activate your My Chart account. Instructions are located on the last page of this paperwork. If you have not heard from [place] regarding the results in 2 weeks, please contact this office.        Health Maintenance, Male A healthy lifestyle and preventive care is important for your health and wellness. Ask your health care provider about what schedule of regular examinations is right for you. What should I know about weight and diet? Eat a Healthy Diet  Eat plenty of vegetables, fruits, whole grains, low-fat dairy products, and lean protein.  Do not eat a lot of foods high in solid fats, added sugars, or salt.  Maintain a Healthy Weight Regular exercise can help you achieve or maintain a healthy weight. You should:  Do at least 150 minutes of exercise each week. The exercise should increase your heart rate and make you sweat (moderate-intensity exercise).  Do strength-training exercises at least twice a week.  Watch Your Levels of Cholesterol and Blood Lipids  Have your blood tested for lipids and cholesterol every 5 years starting at 69 years of age. If you are at high risk for heart disease, you should start having your blood tested when you are 69 years old. You may need to have your cholesterol levels checked more often if: ? Your lipid or cholesterol levels are high. ? You are older than 69 years of  age. ? You are at high risk for heart disease.  What should I know about cancer screening? Many types of cancers can be detected early and may often be prevented. Lung Cancer  You should be screened every year for lung cancer if: ? You are a current smoker who has smoked for at least 30 years. ? You are a former smoker who has quit within the past 15 years.  Talk to your health care provider about your screening options, when you should start screening, and how often you should be screened.  Colorectal Cancer  Routine colorectal cancer screening usually begins at 69 years of age and should be repeated every 5-10 years until you are 69 years old. You may need to be screened more often if early forms of precancerous polyps or small growths are found. Your health care provider may recommend screening at an earlier age if you have risk factors for colon cancer.  Your health care provider may recommend using home test kits to check for hidden blood in the stool.  A small camera at the end of a tube can be used to examine your colon (sigmoidoscopy or colonoscopy). This checks for the earliest forms of colorectal cancer.  Prostate and Testicular Cancer  Depending on your age and overall health, your health care provider may do certain tests to screen for prostate and testicular cancer.  Talk to your health care provider about any symptoms or concerns you have about testicular or prostate cancer.  Skin Cancer  Check  your skin from head to toe regularly.  Tell your health care provider about any new moles or changes in moles, especially if: ? There is a change in a mole's size, shape, or color. ? You have a mole that is larger than a pencil eraser.  Always use sunscreen. Apply sunscreen liberally and repeat throughout the day.  Protect yourself by wearing long sleeves, pants, a wide-brimmed hat, and sunglasses when outside.  What should I know about heart disease, diabetes, and high  blood pressure?  If you are 7-68 years of age, have your blood pressure checked every 3-5 years. If you are 60 years of age or older, have your blood pressure checked every year. You should have your blood pressure measured twice-once when you are at a hospital or clinic, and once when you are not at a hospital or clinic. Record the average of the two measurements. To check your blood pressure when you are not at a hospital or clinic, you can use: ? An automated blood pressure machine at a pharmacy. ? A home blood pressure monitor.  Talk to your health care provider about your target blood pressure.  If you are between 34-21 years old, ask your health care provider if you should take aspirin to prevent heart disease.  Have regular diabetes screenings by checking your fasting blood sugar level. ? If you are at a normal weight and have a low risk for diabetes, have this test once every three years after the age of 28. ? If you are overweight and have a high risk for diabetes, consider being tested at a younger age or more often.  A one-time screening for abdominal aortic aneurysm (AAA) by ultrasound is recommended for men aged 58-75 years who are current or former smokers. What should I know about preventing infection? Hepatitis B If you have a higher risk for hepatitis B, you should be screened for this virus. Talk with your health care provider to find out if you are at risk for hepatitis B infection. Hepatitis C Blood testing is recommended for:  Everyone born from [birth year] through 1963.  Anyone with known risk factors for hepatitis C.  Sexually Transmitted Diseases (STDs)  You should be screened each year for STDs including gonorrhea and chlamydia if: ? You are sexually active and are younger than 69 years of age. ? You are older than 69 years of age and your health care provider tells you that you are at risk for this type of infection. ? Your sexual activity has changed since you were  last screened and you are at an increased risk for chlamydia or gonorrhea. Ask your health care provider if you are at risk.  Talk with your health care provider about whether you are at high risk of being infected with HIV. Your health care provider may recommend a prescription medicine to help prevent HIV infection.  What else can I do?  Schedule regular health, dental, and eye exams.  Stay current with your vaccines (immunizations).  Do not use any tobacco products, such as cigarettes, chewing tobacco, and e-cigarettes. If you need help quitting, ask your health care provider.  Limit alcohol intake to no more than 2 drinks per day. One drink equals 12 ounces of beer, 5 ounces of wine, or 1 ounces of hard liquor.  Do not use street drugs.  Do not share needles.  Ask your health care provider for help if you need support or information about quitting drugs.  Tell your  health care provider if you often feel depressed.  Tell your health care provider if you have ever been abused or do not feel safe at home. This information is not intended to replace advice given to you by your health care provider. Make sure you discuss any questions you have with your health care provider. Document Released: 08/29/2007 Document Revised: 10/30/2015 Document Reviewed: 12/04/2014 Elsevier Interactive Patient Education  2018 [place] (AHA) Exercise Recommendation  Being physically active is important to prevent heart disease and stroke, the nation's No. 1and No. 5killers. To improve overall cardiovascular health, we suggest at least 150 minutes per week of moderate exercise or 75 minutes per week of vigorous exercise (or a combination of moderate and vigorous activity). Thirty minutes a day, five times a week is an easy goal to remember. You will also experience benefits even if you divide your time into two or three segments of 10 to 15 minutes per day.  For people who  would benefit from lowering their blood pressure or cholesterol, we recommend 40 minutes of aerobic exercise of moderate to vigorous intensity three to four times a week to lower the risk for heart attack and stroke.  Physical activity is anything that makes you move your body and burn calories.  This includes things like climbing stairs or playing sports. Aerobic exercises benefit your heart, and include walking, jogging, swimming or biking. Strength and stretching exercises are best for overall stamina and flexibility.  The simplest, positive change you can make to effectively improve your heart health is to start walking. It's enjoyable, free, easy, social and great exercise. A walking program is flexible and boasts high success rates because people can stick with it. It's easy for walking to become a regular and satisfying part of life.   For Overall Cardiovascular Health:  At least 30 minutes of moderate-intensity aerobic activity at least 5 days per week for a total of 150  OR   At least 25 minutes of vigorous aerobic activity at least 3 days per week for a total of 75 minutes; or a combination of moderate- and vigorous-intensity aerobic activity  AND   Moderate- to high-intensity muscle-strengthening activity at least 2 days per week for additional health benefits.  For Lowering Blood Pressure and Cholesterol  An average 40 minutes of moderate- to vigorous-intensity aerobic activity 3 or 4 times per week  What if I can't make it to the time goal? Something is always better than nothing! And everyone has to start somewhere. Even if you've been sedentary for years, today is the day you can begin to make healthy changes in your life. If you don't think you'll make it for 30 or 40 minutes, set a reachable goal for today. You can work up toward your overall goal by increasing your time as you get stronger. Don't let all-or-nothing thinking rob you of doing what you can every day.   Source:http://www.heart.org

## 2016-12-16 NOTE — Progress Notes (Signed)
Edwin Mckee 71 y.o.   Chief Complaint  Patient presents with  . Annual Exam  . Medication Refill    HISTORY OF PRESENT ILLNESS: This is a 71 y.o. male here for annual exam. Has h/o depression, on Prozac for at least 15 years; has h/o IHD, s/p MI 3 years ago, doing well with no episodes of angina; sees cardiologist who also handles his HTN; recently switched to Olmesartan 20 mg; thinks medication is making him feel "not well" feeling tired and weak at times.  HPI   Prior to Admission medications   Medication Sig Start Date End Date Taking? Authorizing Provider  aspirin 81 MG tablet Take 81 mg by mouth daily.   Yes [provider]  atorvastatin (LIPITOR) 80 MG tablet Take 1 tablet (80 mg total) by mouth daily at 6 PM. 04/26/14  Yes Neldon Labella, NP  diphenhydrAMINE (BENADRYL) 25 MG tablet Take 25 mg by mouth at bedtime as needed.   Yes [provider]  FLUoxetine (PROZAC) 40 MG capsule Take 1 capsule (40 mg total) by mouth daily. 04/26/14  Yes Adrian Prows, MD  nitroGLYCERIN (NITROSTAT) 0.4 MG SL tablet 1 (ONE) TABLET UNDER TONGUE EVERY 5 MINUTES AS NEEDED FOR CHEST PAIN. 08/20/15  Yes [provider]  nitroGLYCERIN (NITRODUR - DOSED IN MG/24 HR) 0.2 mg/hr patch Apply 1/4th patch over affected achilles, change daily Patient not taking: Reported on 12/16/2016 09/10/15   Dene Gentry, MD    No Known Allergies  Patient Active Problem List   Diagnosis Date Noted  . Achilles tendinitis 09/12/2015  . NSTEMI (non-ST elevated myocardial infarction) ([place]) 04/25/2014  . Hearing loss 05/04/2012  . HEMORRHOIDS-EXTERNAL 01/31/2008  . RECTAL BLEEDING 01/31/2008  . FECAL OCCULT BLOOD 01/31/2008  . HYPERLIPIDEMIA 01/26/2008  . Essential hypertension 01/26/2008  . HEMORRHOIDS 01/26/2008  . INGUINAL HERNIA, HX OF 01/26/2008    Past Medical History:  Diagnosis Date  . Arthritis   . Depression   . Hypertension   . NSTEMI (non-ST elevated myocardial infarction)  ([place]) 04/25/2014  . Substance abuse [facility])     Past Surgical History:  Procedure Laterality Date  . HERNIA REPAIR    . LEFT HEART CATHETERIZATION WITH CORONARY ANGIOGRAM N/A 04/25/2014   Procedure: LEFT HEART CATHETERIZATION WITH CORONARY ANGIOGRAM;  Surgeon: Laverda Page, MD;  Location: [facility] CATH LAB;  Service: Cardiovascular;  Laterality: N/A;  . VASECTOMY      Social History   Social History  . Marital status: Married    Spouse name: N/A  . Number of children: N/A  . Years of education: N/A   Occupational History  . Not on file.   Social History Main Topics  . Smoking status: Former Smoker    Quit date: 04/12/1982  . Smokeless tobacco: Never Used  . Alcohol use No  . Drug use: No  . Sexual activity: Yes   Other Topics Concern  . Not on file   Social History Narrative  . No narrative on file    Family History  Problem Relation Age of Onset  . Cancer Mother        lung  . Hypertension Father   . Alzheimer's disease Father   . Heart disease Sister   . Heart disease Brother   . Stroke Maternal Grandmother   . Alcohol abuse Maternal Grandfather   . Cancer Maternal Grandfather   . Stroke Paternal Grandmother   . Cancer Paternal Grandfather      Review of Systems  Constitutional: Positive for malaise/fatigue. Negative for chills and fever.  HENT: Negative.  Negative for sore throat.   Eyes: Negative.  Negative for blurred vision, double vision, discharge and redness.  Respiratory: Negative.  Negative for cough and shortness of breath.   Cardiovascular: Negative.  Negative for chest pain, palpitations and leg swelling.  Gastrointestinal: Negative for abdominal pain, blood in stool, constipation, diarrhea, nausea and vomiting.  Genitourinary: Negative.  Negative for dysuria and hematuria.  Musculoskeletal: Negative for back pain, myalgias and neck pain.  Skin: Negative for rash.  Neurological: Positive for weakness (occassional). Negative for dizziness and  headaches.  Endo/Heme/Allergies: Negative.   Psychiatric/Behavioral: Positive for depression (chronic).  All other systems reviewed and are negative.  Vitals:   12/16/16 0807 12/16/16 0813  BP: (!) 160/88 120/88  Pulse: 78   Resp: 16   Temp: 97.7 F (36.5 C)   SpO2: 99%      Physical Exam  Constitutional: He is oriented to person, place, and time. He appears well-developed and well-nourished.  HENT:  Head: Normocephalic and atraumatic.  Right Ear: External ear normal.  Left Ear: External ear normal.  Nose: Nose normal.  Mouth/Throat: Oropharynx is clear and moist.  Eyes: Pupils are equal, round, and reactive to light. Conjunctivae and EOM are normal.  Neck: Normal range of motion. No JVD present. No thyromegaly present.  Cardiovascular: Normal rate, regular rhythm, normal heart sounds and intact distal pulses.   Pulmonary/Chest: Effort normal and breath sounds normal.  Abdominal: Soft. Bowel sounds are normal. He exhibits no distension. There is no tenderness.  Musculoskeletal: Normal range of motion.  Lymphadenopathy:    He has no cervical adenopathy.  Neurological: He is alert and oriented to person, place, and time. No sensory deficit. He exhibits normal muscle tone.  Skin: Skin is warm and dry. Capillary refill takes less than 2 seconds. No rash noted.  Psychiatric: He has a normal mood and affect. His behavior is normal.  Vitals reviewed.    ASSESSMENT & PLAN: Edwin Mckee was seen today for annual exam and medication refill.  Diagnoses and all orders for this visit:  Routine general medical examination at a health care facility -     CBC with Differential -     Comprehensive metabolic panel -     Hemoglobin A1c -     Lipid panel -     PSA(Must document that pt has been informed of limitations of PSA testing.) -     TSH -     Hepatitis C antibody screen  Need for prophylactic vaccination and inoculation against influenza -     Flu Vaccine QUAD 36+ mos IM  Need for  diphtheria-tetanus-pertussis (Tdap) vaccine -     Tdap vaccine greater than or equal to 7yo IM  Essential hypertension  IHD (ischemic heart disease)  History of MI (myocardial infarction)  History of depression  Other orders -     FLUoxetine (PROZAC) 40 MG capsule; Take 1 capsule (40 mg total) by mouth daily.    Patient Instructions       IF you received an x-ray today, you will receive an invoice from [facility]. Please contact [facility] at [phone] with questions or concerns regarding your invoice.   IF you received labwork today, you will receive an invoice from [place]. Please contact [facility] at [phone] with questions or concerns regarding your invoice.   Our billing staff will not be able to assist you with questions regarding bills from these companies.  You will be contacted with the lab results as soon as they are available. The fastest way to get your results is to activate your My Chart account. Instructions are located on the last page of this paperwork. If you have not heard from [place] regarding the results in 2 weeks, please contact this office.        Health Maintenance, Male A healthy lifestyle and preventive care is important for your health and wellness. Ask your health care provider about what schedule of regular examinations is right for you. What should I know about weight and diet? Eat a Healthy Diet  Eat plenty of vegetables, fruits, whole grains, low-fat dairy products, and lean protein.  Do not eat a lot of foods high in solid fats, added sugars, or salt.  Maintain a Healthy Weight Regular exercise can help you achieve or maintain a healthy weight. You should:  Do at least 150 minutes of exercise each week. The exercise should increase your heart rate and make you sweat (moderate-intensity exercise).  Do strength-training exercises at least twice a week.  Watch Your Levels of Cholesterol and Blood Lipids  Have  your blood tested for lipids and cholesterol every 5 years starting at 69 years of age. If you are at high risk for heart disease, you should start having your blood tested when you are 69 years old. You may need to have your cholesterol levels checked more often if: ? Your lipid or cholesterol levels are high. ? You are older than 69 years of age. ? You are at high risk for heart disease.  What should I know about cancer screening? Many types of cancers can be detected early and may often be prevented. Lung Cancer  You should be screened every year for lung cancer if: ? You are a current smoker who has smoked for at least 30 years. ? You are a former smoker who has quit within the past 15 years.  Talk to your health care provider about your screening options, when you should start screening, and how often you should be screened.  Colorectal Cancer  Routine colorectal cancer screening usually begins at 69 years of age and should be repeated every 5-10 years until you are 69 years old. You may need to be screened more often if early forms of precancerous polyps or small growths are found. Your health care provider may recommend screening at an earlier age if you have risk factors for colon cancer.  Your health care provider may recommend using home test kits to check for hidden blood in the stool.  A small camera at the end of a tube can be used to examine your colon (sigmoidoscopy or colonoscopy). This checks for the earliest forms of colorectal cancer.  Prostate and Testicular Cancer  Depending on your age and overall health, your health care provider may do certain tests to screen for prostate and testicular cancer.  Talk to your health care provider about any symptoms or concerns you have about testicular or prostate cancer.  Skin Cancer  Check your skin from head to toe regularly.  Tell your health care provider about any new moles or changes in moles, especially if: ? There is  a change in a mole's size, shape, or color. ? You have a mole that is larger than a pencil eraser.  Always use sunscreen. Apply sunscreen liberally and repeat throughout the day.  Protect yourself by wearing long sleeves, pants, a wide-brimmed hat, and sunglasses when outside.  What should I know about heart disease, diabetes, and high blood pressure?  If you are 68-30 years of age, have your blood pressure checked every 3-5 years. If you are 37 years of age or older, have your blood pressure checked every year. You should have your blood pressure measured twice-once when you are at a hospital or clinic, and once when you are not at a hospital or clinic. Record the average of the two measurements. To check your blood pressure when you are not at a hospital or clinic, you can use: ? An automated blood pressure machine at a pharmacy. ? A home blood pressure monitor.  Talk to your health care provider about your target blood pressure.  If you are between 66-60 years old, ask your health care provider if you should take aspirin to prevent heart disease.  Have regular diabetes screenings by checking your fasting blood sugar level. ? If you are at a normal weight and have a low risk for diabetes, have this test once every three years after the age of 65. ? If you are overweight and have a high risk for diabetes, consider being tested at a younger age or more often.  A one-time screening for abdominal aortic aneurysm (AAA) by ultrasound is recommended for men aged 24-75 years who are current or former smokers. What should I know about preventing infection? Hepatitis B If you have a higher risk for hepatitis B, you should be screened for this virus. Talk with your health care provider to find out if you are at risk for hepatitis B infection. Hepatitis C Blood testing is recommended for:  Everyone born from [birth year] through 1963.  Anyone with known risk factors for hepatitis C.  Sexually  Transmitted Diseases (STDs)  You should be screened each year for STDs including gonorrhea and chlamydia if: ? You are sexually active and are younger than 68 years of age. ? You are older than 69 years of age and your health care provider tells you that you are at risk for this type of infection. ? Your sexual activity has changed since you were last screened and you are at an increased risk for chlamydia or gonorrhea. Ask your health care provider if you are at risk.  Talk with your health care provider about whether you are at high risk of being infected with HIV. Your health care provider may recommend a prescription medicine to help prevent HIV infection.  What else can I do?  Schedule regular health, dental, and eye exams.  Stay current with your vaccines (immunizations).  Do not use any tobacco products, such as cigarettes, chewing tobacco, and e-cigarettes. If you need help quitting, ask your health care provider.  Limit alcohol intake to no more than 2 drinks per day. One drink equals 12 ounces of beer, 5 ounces of wine, or 1 ounces of hard liquor.  Do not use street drugs.  Do not share needles.  Ask your health care provider for help if you need support or information about quitting drugs.  Tell your health care provider if you often feel depressed.  Tell your health care provider if you have ever been abused or do not feel safe at home. This information is not intended to replace advice given to you by your health care provider. Make sure you discuss any questions you have with your health care provider. Document Released: 08/29/2007 Document Revised: 10/30/2015 Document Reviewed: 12/04/2014 Elsevier Interactive Patient Education  2018 [place]  Association (AHA) Exercise Recommendation  Being physically active is important to prevent heart disease and stroke, the nation's No. 1and No. 5killers. To improve overall cardiovascular health, we suggest  at least 150 minutes per week of moderate exercise or 75 minutes per week of vigorous exercise (or a combination of moderate and vigorous activity). Thirty minutes a day, five times a week is an easy goal to remember. You will also experience benefits even if you divide your time into two or three segments of 10 to 15 minutes per day.  For people who would benefit from lowering their blood pressure or cholesterol, we recommend 40 minutes of aerobic exercise of moderate to vigorous intensity three to four times a week to lower the risk for heart attack and stroke.  Physical activity is anything that makes you move your body and burn calories.  This includes things like climbing stairs or playing sports. Aerobic exercises benefit your heart, and include walking, jogging, swimming or biking. Strength and stretching exercises are best for overall stamina and flexibility.  The simplest, positive change you can make to effectively improve your heart health is to start walking. It's enjoyable, free, easy, social and great exercise. A walking program is flexible and boasts high success rates because people can stick with it. It's easy for walking to become a regular and satisfying part of life.   For Overall Cardiovascular Health:  At least 30 minutes of moderate-intensity aerobic activity at least 5 days per week for a total of 150  OR   At least 25 minutes of vigorous aerobic activity at least 3 days per week for a total of 75 minutes; or a combination of moderate- and vigorous-intensity aerobic activity  AND   Moderate- to high-intensity muscle-strengthening activity at least 2 days per week for additional health benefits.  For Lowering Blood Pressure and Cholesterol  An average 40 minutes of moderate- to vigorous-intensity aerobic activity 3 or 4 times per week  What if I can't make it to the time goal? Something is always better than nothing! And everyone has to start somewhere. Even if  you've been sedentary for years, today is the day you can begin to make healthy changes in your life. If you don't think you'll make it for 30 or 40 minutes, set a reachable goal for today. You can work up toward your overall goal by increasing your time as you get stronger. Don't let all-or-nothing thinking rob you of doing what you can every day.  Source:[email or web address].[email or web address] MD [facility]

## 2016-12-17 LAB — COMPREHENSIVE METABOLIC PANEL
ALT: 19 IU/L (ref 0–44)
AST: 30 IU/L (ref 0–40)
Albumin/Globulin Ratio: 1.8 (ref 1.2–2.2)
Albumin: 4.4 g/dL (ref 3.6–4.8)
Alkaline Phosphatase: 58 IU/L (ref 39–117)
BILIRUBIN TOTAL: 0.6 mg/dL (ref 0.0–1.2)
BUN / CREAT RATIO: 15 (ref 10–24)
BUN: 15 mg/dL (ref 8–27)
CHLORIDE: 105 mmol/L (ref 96–106)
CO2: 23 mmol/L (ref 20–29)
Calcium: 9.3 mg/dL (ref 8.6–10.2)
Creatinine, Ser: 1 mg/dL (ref 0.76–1.27)
GFR calc Af Amer: 89 mL/min/{1.73_m2} (ref >59)
GFR calc non Af Amer: 77 mL/min/{1.73_m2} (ref >59)
GLUCOSE: 114 mg/dL — AB (ref 65–99)
Globulin, Total: 2.4 g/dL (ref 1.5–4.5)
Potassium: 4.7 mmol/L (ref 3.5–5.2)
Sodium: 139 mmol/L (ref 134–144)
TOTAL PROTEIN: 6.8 g/dL (ref 6.0–8.5)

## 2016-12-17 LAB — CBC WITH DIFFERENTIAL/PLATELET
BASOS ABS: 0 10*3/uL (ref 0.0–0.2)
BASOS: 1 %
EOS (ABSOLUTE): 0.1 10*3/uL (ref 0.0–0.4)
Eos: 2 %
Hematocrit: 42.1 % (ref 37.5–51.0)
Hemoglobin: 13.5 g/dL (ref 13.0–17.7)
Immature Grans (Abs): 0 10*3/uL (ref 0.0–0.1)
Immature Granulocytes: 0 %
LYMPHS: 33 %
Lymphocytes Absolute: 2.1 10*3/uL (ref 0.7–3.1)
MCH: 28.2 pg (ref 26.6–33.0)
MCHC: 32.1 g/dL (ref 31.5–35.7)
MCV: 88 fL (ref 79–97)
MONOCYTES: 8 %
Monocytes Absolute: 0.5 10*3/uL (ref 0.1–0.9)
NEUTROS ABS: 3.6 10*3/uL (ref 1.4–7.0)
Neutrophils: 56 %
Platelets: 282 10*3/uL (ref 150–379)
RBC: 4.78 x10E6/uL (ref 4.14–5.80)
RDW: 14 % (ref 12.3–15.4)
WBC: 6.3 10*3/uL (ref 3.4–10.8)

## 2016-12-17 LAB — LIPID PANEL
CHOL/HDL RATIO: 2.4 ratio (ref 0.0–5.0)
Cholesterol, Total: 119 mg/dL (ref 100–199)
HDL: 50 mg/dL (ref >39)
LDL Calculated: 59 mg/dL (ref 0–99)
Triglycerides: 50 mg/dL (ref 0–149)
VLDL CHOLESTEROL CAL: 10 mg/dL (ref 5–40)

## 2016-12-17 LAB — PSA: Prostate Specific Ag, Serum: 0.8 ng/mL (ref 0.0–4.0)

## 2016-12-17 LAB — HEMOGLOBIN A1C
Est. average glucose Bld gHb Est-mCnc: 111 mg/dL
Hgb A1c MFr Bld: 5.5 % (ref 4.8–5.6)

## 2016-12-17 LAB — TSH: TSH: 3.8 u[IU]/mL (ref 0.450–4.500)

## 2016-12-17 LAB — HEPATITIS C ANTIBODY: Hep C Virus Ab: <0.1 s/co ratio (ref 0.0–0.9)

## 2017-11-29 ENCOUNTER — Other Ambulatory Visit: Payer: Self-pay | Admitting: Emergency Medicine

## 2017-12-06 ENCOUNTER — Encounter: Payer: Federal, State, Local not specified - PPO | Admitting: Emergency Medicine

## 2017-12-22 ENCOUNTER — Other Ambulatory Visit: Payer: Self-pay

## 2017-12-22 ENCOUNTER — Ambulatory Visit (INDEPENDENT_AMBULATORY_CARE_PROVIDER_SITE_OTHER): Payer: Federal, State, Local not specified - PPO | Admitting: Emergency Medicine

## 2017-12-22 ENCOUNTER — Encounter: Payer: Self-pay | Admitting: Emergency Medicine

## 2017-12-22 VITALS — BP 117/72 | HR 59 | Temp 97.8°F | Resp 16 | Wt 172.0 lb

## 2017-12-22 DIAGNOSIS — Z0000 Encounter for general adult medical examination without abnormal findings: Secondary | ICD-10-CM

## 2017-12-22 DIAGNOSIS — Z23 Encounter for immunization: Secondary | ICD-10-CM | POA: Diagnosis not present

## 2017-12-22 DIAGNOSIS — M7661 Achilles tendinitis, right leg: Secondary | ICD-10-CM

## 2017-12-22 MED ORDER — NITROGLYCERIN 0.2 MG/HR TD PT24: MEDICATED_PATCH | TRANSDERMAL | 1 refills | Status: DC

## 2017-12-22 NOTE — Patient Instructions (Addendum)

## 2017-12-22 NOTE — Progress Notes (Signed)
Edwin Mckee. 72 y.o.   Chief Complaint  Patient presents with  . Annual Exam    HISTORY OF PRESENT ILLNESS: This is a 72 y.o. male Here for annual exam; no complaints and no medical concerns. Has a history of high blood pressure, medications, doing well. Also has a history of ischemic heart disease status post MI several years ago.  Saw cardiologist last Monday and everything was fine.  Doing well.  No complaints. Was restarted on Prozac last year.  Doing well.  No complaints. Has a history of right-sided Achilles tendon tendinitis.  Orthopedist in the past prescribed patches of nitroglycerin with success.  Requesting some more.  Ones at home are expired. Noticed also a lump on the right upper arm and left axillary areas. Overall doing very well.  Staying active.  Eating well. HPI   Prior to Admission medications   Medication Sig Start Date End Date Taking? Authorizing Provider  aspirin 81 MG tablet Take 81 mg by mouth daily.   Yes [provider]  atorvastatin (LIPITOR) 80 MG tablet Take 1 tablet (80 mg total) by mouth daily at 6 PM. 04/26/14  Yes Neldon Labella, NP  diphenhydrAMINE (BENADRYL) 25 MG tablet Take 25 mg by mouth at bedtime as needed.   Yes [provider]  eplerenone (INSPRA) 25 MG tablet Take 25 mg by mouth daily.   Yes [provider]  FLUoxetine (PROZAC) 40 MG capsule TAKE 1 CAPSULE BY MOUTH EVERY DAY 11/29/17  Yes Cleopha Indelicato, Ines Bloomer, MD  nitroGLYCERIN (NITROSTAT) 0.4 MG SL tablet 1 (ONE) TABLET UNDER TONGUE EVERY 5 MINUTES AS NEEDED FOR CHEST PAIN. 08/20/15  Yes [provider]  VALSARTAN PO Take 20 mg by mouth daily.   Yes [provider]  nitroGLYCERIN (NITRODUR - DOSED IN MG/24 HR) 0.2 mg/hr patch Apply 1/4th patch over affected achilles, change daily Patient not taking: Reported on 12/16/2016 09/10/15   Dene Gentry, MD    No Known Allergies  Patient Active Problem List   Diagnosis Date Noted  . IHD  (ischemic heart disease) 12/16/2016  . History of MI (myocardial infarction) 12/16/2016  . Need for diphtheria-tetanus-pertussis (Tdap) vaccine 12/16/2016  . Routine general medical examination at a health care facility 12/16/2016  . Need for prophylactic vaccination and inoculation against influenza 12/16/2016  . History of depression 12/16/2016  . NSTEMI (non-ST elevated myocardial infarction) ([place]) 04/25/2014  . Hearing loss 05/04/2012  . HYPERLIPIDEMIA 01/26/2008  . Essential hypertension 01/26/2008  . INGUINAL HERNIA, HX OF 01/26/2008    Past Medical History:  Diagnosis Date  . Arthritis   . Depression   . Hypertension   . NSTEMI (non-ST elevated myocardial infarction) ([place]) 04/25/2014  . Substance abuse [facility])     Past Surgical History:  Procedure Laterality Date  . HERNIA REPAIR    . LEFT HEART CATHETERIZATION WITH CORONARY ANGIOGRAM N/A 04/25/2014   Procedure: LEFT HEART CATHETERIZATION WITH CORONARY ANGIOGRAM;  Surgeon: Laverda Page, MD;  Location: [facility] CATH LAB;  Service: Cardiovascular;  Laterality: N/A;  . VASECTOMY      Social History   Socioeconomic History  . Marital status: Married    Spouse name: Not on file  . Number of children: Not on file  . Years of education: Not on file  . Highest education level: Not on file  Occupational History  . Not on file  Social Needs  . Financial resource strain: Not on file  . Food insecurity:    Worry:  Not on file    Inability: Not on file  . Transportation needs:    Medical: Not on file    Non-medical: Not on file  Tobacco Use  . Smoking status: Former Smoker    Last attempt to quit: 04/12/1982    Years since quitting: 35.7  . Smokeless tobacco: Never Used  Substance and Sexual Activity  . Alcohol use: No    Alcohol/week: 0.0 standard drinks  . Drug use: No  . Sexual activity: Yes  Lifestyle  . Physical activity:    Days per week: Not on file    Minutes per session: Not on file  . Stress: Not on file    Relationships  . Social connections:    Talks on phone: Not on file    Gets together: Not on file    Attends religious service: Not on file    Active member of club or organization: Not on file    Attends meetings of clubs or organizations: Not on file    Relationship status: Not on file  . Intimate partner violence:    Fear of current or ex partner: Not on file    Emotionally abused: Not on file    Physically abused: Not on file    Forced sexual activity: Not on file  Other Topics Concern  . Not on file  Social History Narrative  . Not on file    Family History  Problem Relation Age of Onset  . Cancer Mother        lung  . Hypertension Father   . Alzheimer's disease Father   . Heart disease Sister   . Heart disease Brother   . Stroke Maternal Grandmother   . Alcohol abuse Maternal Grandfather   . Cancer Maternal Grandfather   . Stroke Paternal Grandmother   . Cancer Paternal Grandfather      Review of Systems  Constitutional: Negative.  Negative for chills.  HENT: Negative.  Negative for sinus pain and sore throat.   Eyes: Negative.  Negative for blurred vision and double vision.  Respiratory: Negative.  Negative for cough and shortness of breath.   Cardiovascular: Negative.  Negative for chest pain and palpitations.  Gastrointestinal: Negative.  Negative for abdominal pain, blood in stool, diarrhea, nausea and vomiting.  Genitourinary: Negative.  Negative for dysuria and hematuria.  Musculoskeletal: Negative.  Negative for back pain, myalgias and neck pain.  Skin: Negative.  Negative for rash.  Neurological: Negative.  Negative for dizziness and headaches.  Endo/Heme/Allergies: Negative.   All other systems reviewed and are negative.   Vitals:   12/22/17 0923  BP: 117/72  Pulse: (!) 59  Resp: 16  Temp: 97.8 F (36.6 C)  SpO2: 99%    Physical Exam  Constitutional: He is oriented to person, place, and time. He appears well-developed and well-nourished.   HENT:  Head: Normocephalic and atraumatic.  Nose: Nose normal.  Mouth/Throat: Oropharynx is clear and moist.  Eyes: Pupils are equal, round, and reactive to light. Conjunctivae and EOM are normal.  Neck: Normal range of motion. Neck supple. No JVD present. No thyromegaly present.  Cardiovascular: Normal rate, regular rhythm and normal heart sounds.  Pulmonary/Chest: Effort normal and breath sounds normal.  Abdominal: Soft. Bowel sounds are normal. He exhibits no distension. There is no tenderness.  Musculoskeletal: Normal range of motion.  Lymphadenopathy:    He has no cervical adenopathy.  Neurological: He is alert and oriented to person, place, and time. No sensory deficit. He  exhibits normal muscle tone.  Skin: Skin is warm and dry. Capillary refill takes less than 2 seconds.  Positive sebaceous cyst to right upper arm.  Small palpable lymph node on left axillary area.  No signs of infection.  Psychiatric: He has a normal mood and affect. His behavior is normal.  Vitals reviewed.    ASSESSMENT & PLAN: Mayjor was seen today for annual exam.  Diagnoses and all orders for this visit:  Routine general medical examination at a health care facility -     Comprehensive metabolic panel -     Hemoglobin A1c -     Lipid panel  Need for prophylactic vaccination and inoculation against influenza -     Flu vaccine HIGH DOSE PF  Need for pneumococcal vaccination -     PCV 13 (Prevnar)  Achilles tendinitis of right lower extremity -     nitroGLYCERIN (NITRODUR - DOSED IN MG/24 HR) 0.2 mg/hr patch; Apply 1/4th patch over affected achilles, change daily    Patient Instructions       If you have lab work done today you will be contacted with your lab results within the next 2 weeks.  If you have not heard from [place] then please contact us. The fastest way to get your results is to register for My Chart.   IF you received an x-ray today, you will receive an invoice from [facility]. Please contact [facility] at [phone] with questions or concerns regarding your invoice.   IF you received labwork today, you will receive an invoice from [facility]. Please contact [facility] at [phone] with questions or concerns regarding your invoice.   Our billing staff will not be able to assist you with questions regarding bills from these companies.  You will be contacted with the lab results as soon as they are available. The fastest way to get your results is to activate your My Chart account. Instructions are located on the last page of this paperwork. If you have not heard from [place] regarding the results in 2 weeks, please contact this office.      Health Maintenance, Male A healthy lifestyle and preventive care is important for your health and wellness. Ask your health care provider about what schedule of regular examinations is right for you. What should I know about weight and diet? Eat a Healthy Diet  Eat plenty of vegetables, fruits, whole grains, low-fat dairy products, and lean protein.  Do not eat a lot of foods high in solid fats, added sugars, or salt.  Maintain a Healthy Weight Regular exercise can help you achieve or maintain a healthy weight. You should:  Do at least 150 minutes of exercise each week. The exercise should increase your heart rate and make you sweat (moderate-intensity exercise).  Do strength-training exercises at least twice a week.  Watch Your Levels of Cholesterol and Blood Lipids  Have your blood tested for lipids and cholesterol every 5 years starting at 70 years of age. If you are at high risk for heart disease, you should start having your blood tested when you are 70 years old. You may need to have your cholesterol levels checked more often if: ? Your lipid or cholesterol levels are high. ? You are older than 70 years of age. ? You are at high risk for heart disease.  What should I know about cancer  screening? Many types of cancers can be detected early and may often be prevented. Lung Cancer  You should  be screened every year for lung cancer if: ? You are a current smoker who has smoked for at least 30 years. ? You are a former smoker who has quit within the past 15 years.  Talk to your health care provider about your screening options, when you should start screening, and how often you should be screened.  Colorectal Cancer  Routine colorectal cancer screening usually begins at 70 years of age and should be repeated every 5-10 years until you are 70 years old. You may need to be screened more often if early forms of precancerous polyps or small growths are found. Your health care provider may recommend screening at an earlier age if you have risk factors for colon cancer.  Your health care provider may recommend using home test kits to check for hidden blood in the stool.  A small camera at the end of a tube can be used to examine your colon (sigmoidoscopy or colonoscopy). This checks for the earliest forms of colorectal cancer.  Prostate and Testicular Cancer  Depending on your age and overall health, your health care provider may do certain tests to screen for prostate and testicular cancer.  Talk to your health care provider about any symptoms or concerns you have about testicular or prostate cancer.  Skin Cancer  Check your skin from head to toe regularly.  Tell your health care provider about any new moles or changes in moles, especially if: ? There is a change in a mole's size, shape, or color. ? You have a mole that is larger than a pencil eraser.  Always use sunscreen. Apply sunscreen liberally and repeat throughout the day.  Protect yourself by wearing long sleeves, pants, a wide-brimmed hat, and sunglasses when outside.  What should I know about heart disease, diabetes, and high blood pressure?  If you are 57-59 years of age, have your blood pressure checked  every 3-5 years. If you are 72 years of age or older, have your blood pressure checked every year. You should have your blood pressure measured twice-once when you are at a hospital or clinic, and once when you are not at a hospital or clinic. Record the average of the two measurements. To check your blood pressure when you are not at a hospital or clinic, you can use: ? An automated blood pressure machine at a pharmacy. ? A home blood pressure monitor.  Talk to your health care provider about your target blood pressure.  If you are between 66-20 years old, ask your health care provider if you should take aspirin to prevent heart disease.  Have regular diabetes screenings by checking your fasting blood sugar level. ? If you are at a normal weight and have a low risk for diabetes, have this test once every three years after the age of 77. ? If you are overweight and have a high risk for diabetes, consider being tested at a younger age or more often.  A one-time screening for abdominal aortic aneurysm (AAA) by ultrasound is recommended for men aged 36-75 years who are current or former smokers. What should I know about preventing infection? Hepatitis B If you have a higher risk for hepatitis B, you should be screened for this virus. Talk with your health care provider to find out if you are at risk for hepatitis B infection. Hepatitis C Blood testing is recommended for:  Everyone born from [birth year] through 1963.  Anyone with known risk factors for hepatitis C.  Sexually Transmitted Diseases (  STDs)  You should be screened each year for STDs including gonorrhea and chlamydia if: ? You are sexually active and are younger than 70 years of age. ? You are older than 70 years of age and your health care provider tells you that you are at risk for this type of infection. ? Your sexual activity has changed since you were last screened and you are at an increased risk for chlamydia or gonorrhea. Ask your  health care provider if you are at risk.  Talk with your health care provider about whether you are at high risk of being infected with HIV. Your health care provider may recommend a prescription medicine to help prevent HIV infection.  What else can I do?  Schedule regular health, dental, and eye exams.  Stay current with your vaccines (immunizations).  Do not use any tobacco products, such as cigarettes, chewing tobacco, and e-cigarettes. If you need help quitting, ask your health care provider.  Limit alcohol intake to no more than 2 drinks per day. One drink equals 12 ounces of beer, 5 ounces of wine, or 1 ounces of hard liquor.  Do not use street drugs.  Do not share needles.  Ask your health care provider for help if you need support or information about quitting drugs.  Tell your health care provider if you often feel depressed.  Tell your health care provider if you have ever been abused or do not feel safe at home. This information is not intended to replace advice given to you by your health care provider. Make sure you discuss any questions you have with your health care provider. Document Released: 08/29/2007 Document Revised: 10/30/2015 Document Reviewed: 12/04/2014 Elsevier Interactive Patient Education  2018 Elsevier Inc.      Agustina Caroli, MD [facility]

## 2017-12-23 ENCOUNTER — Encounter: Payer: Self-pay | Admitting: Radiology

## 2017-12-23 LAB — COMPREHENSIVE METABOLIC PANEL
ALT: 16 IU/L (ref 0–44)
AST: 20 IU/L (ref 0–40)
Albumin/Globulin Ratio: 2.1 (ref 1.2–2.2)
Albumin: 4.4 g/dL (ref 3.6–4.8)
Alkaline Phosphatase: 65 IU/L (ref 39–117)
BUN/Creatinine Ratio: 14 (ref 10–24)
BUN: 14 mg/dL (ref 8–27)
Bilirubin Total: 0.3 mg/dL (ref 0.0–1.2)
CO2: 22 mmol/L (ref 20–29)
CREATININE: 0.99 mg/dL (ref 0.76–1.27)
Calcium: 9.4 mg/dL (ref 8.6–10.2)
Chloride: 103 mmol/L (ref 96–106)
GFR calc non Af Amer: 77 mL/min/{1.73_m2} (ref >59)
GFR, EST AFRICAN AMERICAN: 89 mL/min/{1.73_m2} (ref >59)
GLUCOSE: 104 mg/dL — AB (ref 65–99)
Globulin, Total: 2.1 g/dL (ref 1.5–4.5)
Potassium: 4.8 mmol/L (ref 3.5–5.2)
Sodium: 142 mmol/L (ref 134–144)
TOTAL PROTEIN: 6.5 g/dL (ref 6.0–8.5)

## 2017-12-23 LAB — HEMOGLOBIN A1C
Est. average glucose Bld gHb Est-mCnc: 114 mg/dL
Hgb A1c MFr Bld: 5.6 % (ref 4.8–5.6)

## 2017-12-23 LAB — LIPID PANEL
CHOLESTEROL TOTAL: 152 mg/dL (ref 100–199)
Chol/HDL Ratio: 2.8 ratio (ref 0.0–5.0)
HDL: 54 mg/dL (ref >39)
LDL Calculated: 87 mg/dL (ref 0–99)
TRIGLYCERIDES: 53 mg/dL (ref 0–149)
VLDL Cholesterol Cal: 11 mg/dL (ref 5–40)

## 2018-03-29 ENCOUNTER — Ambulatory Visit: Payer: Federal, State, Local not specified - PPO | Admitting: Psychiatry

## 2018-03-29 DIAGNOSIS — F39 Unspecified mood [affective] disorder: Secondary | ICD-10-CM | POA: Diagnosis not present

## 2018-03-29 DIAGNOSIS — F429 Obsessive-compulsive disorder, unspecified: Secondary | ICD-10-CM | POA: Diagnosis not present

## 2018-03-29 MED ORDER — DIVALPROEX SODIUM ER 500 MG PO TB24: ORAL_TABLET | ORAL | 0 refills | Status: DC
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
## 2018-04-05 ENCOUNTER — Ambulatory Visit: Payer: Federal, State, Local not specified - PPO | Admitting: Family Medicine

## 2018-04-05 ENCOUNTER — Other Ambulatory Visit: Payer: Self-pay

## 2018-04-05 ENCOUNTER — Encounter: Payer: Self-pay | Admitting: Family Medicine

## 2018-04-05 VITALS — BP 128/82 | HR 66 | Temp 97.9°F | Resp 16 | Ht 69.0 in | Wt 178.4 lb

## 2018-04-05 DIAGNOSIS — K4090 Unilateral inguinal hernia, without obstruction or gangrene, not specified as recurrent: Secondary | ICD-10-CM | POA: Diagnosis not present

## 2018-04-05 DIAGNOSIS — R1031 Right lower quadrant pain: Secondary | ICD-10-CM

## 2018-04-05 DIAGNOSIS — Z01818 Encounter for other preprocedural examination: Secondary | ICD-10-CM | POA: Diagnosis not present

## 2018-04-05 DIAGNOSIS — Z9889 Other specified postprocedural states: Secondary | ICD-10-CM | POA: Diagnosis not present

## 2018-04-05 DIAGNOSIS — Z8719 Personal history of other diseases of the digestive system: Secondary | ICD-10-CM

## 2018-04-05 LAB — POCT URINALYSIS DIP (MANUAL ENTRY)
BILIRUBIN UA: NEGATIVE mg/dL
Bilirubin, UA: NEGATIVE
Glucose, UA: NEGATIVE mg/dL
Leukocytes, UA: NEGATIVE
Nitrite, UA: NEGATIVE
PROTEIN UA: NEGATIVE mg/dL
RBC UA: NEGATIVE
SPEC GRAV UA: 1.015 (ref 1.010–1.025)
UROBILINOGEN UA: 0.2 U/dL
pH, UA: 7 (ref 5.0–8.0)

## 2018-04-05 LAB — CBC
HEMATOCRIT: 39.8 % (ref 37.5–51.0)
HEMOGLOBIN: 13.5 g/dL (ref 13.0–17.7)
MCH: 29.3 pg (ref 26.6–33.0)
MCHC: 33.9 g/dL (ref 31.5–35.7)
MCV: 87 fL (ref 79–97)
Platelets: 230 10*3/uL (ref 150–450)
RBC: 4.6 x10E6/uL (ref 4.14–5.80)
RDW: 13 % (ref 11.6–15.4)
WBC: 7.2 10*3/uL (ref 3.4–10.8)

## 2018-04-05 LAB — CMP14+EGFR
A/G RATIO: 2 (ref 1.2–2.2)
ALBUMIN: 4.4 g/dL (ref 3.8–4.8)
ALK PHOS: 54 IU/L (ref 39–117)
ALT: 27 IU/L (ref 0–44)
AST: 28 IU/L (ref 0–40)
BILIRUBIN TOTAL: 0.3 mg/dL (ref 0.0–1.2)
BUN / CREAT RATIO: 13 (ref 10–24)
BUN: 13 mg/dL (ref 8–27)
CHLORIDE: 101 mmol/L (ref 96–106)
CO2: 24 mmol/L (ref 20–29)
CREATININE: 1.01 mg/dL (ref 0.76–1.27)
Calcium: 9.7 mg/dL (ref 8.6–10.2)
GFR calc Af Amer: 87 mL/min/{1.73_m2} (ref >59)
GFR calc non Af Amer: 75 mL/min/{1.73_m2} (ref >59)
GLOBULIN, TOTAL: 2.2 g/dL (ref 1.5–4.5)
Glucose: 99 mg/dL (ref 65–99)
POTASSIUM: 4.5 mmol/L (ref 3.5–5.2)
SODIUM: 141 mmol/L (ref 134–144)
Total Protein: 6.6 g/dL (ref 6.0–8.5)

## 2018-04-05 NOTE — Progress Notes (Signed)
Established Patient Office Visit  Subjective:  Patient ID: Edwin Jowett., male    DOB: January 27, 1946  Age: 73 y.o. MRN: [id number]  CC:  Chief Complaint  Patient presents with  . ? hernia groin area    per pt hernia in 2009 and feels the same way the last one felt.   Per pt stinging pain, 3-4/10 pain level, hurts to cough or go to br  and per pt unable to exercise.  Per pt hurt himself shoveling and felt the bulge.  Per pt bulge down there.  The pain is intermittent and aggravating    HPI Edwin Mckee. presents for   Bulge in the groin after he was shoveling sand after Christmas He states that he feels a pain there and he would like to be able to get back to exercise He has pain when he swings the golf club No pain with urination Some pain with straining such as when he has a BM No fevers, chills, nausea or stomach cramping   Past Medical History:  Diagnosis Date  . Arthritis   . Depression   . Hypertension   . NSTEMI (non-ST elevated myocardial infarction) ([place]) 04/25/2014  . Substance abuse [facility])     Past Surgical History:  Procedure Laterality Date  . HERNIA REPAIR    . LEFT HEART CATHETERIZATION WITH CORONARY ANGIOGRAM N/A 04/25/2014   Procedure: LEFT HEART CATHETERIZATION WITH CORONARY ANGIOGRAM;  Surgeon: Laverda Page, MD;  Location: [facility] CATH LAB;  Service: Cardiovascular;  Laterality: N/A;  . VASECTOMY      Family History  Problem Relation Age of Onset  . Cancer Mother        lung  . Hypertension Father   . Alzheimer's disease Father   . Heart disease Sister   . Heart disease Brother   . Stroke Maternal Grandmother   . Alcohol abuse Maternal Grandfather   . Cancer Maternal Grandfather   . Stroke Paternal Grandmother   . Cancer Paternal Grandfather     Social History   Socioeconomic History  . Marital status: Married    Spouse name: Not on file  . Number of children: Not on file  . Years of education: Not on file  . Highest education level:  Not on file  Occupational History  . Not on file  Social Needs  . Financial resource strain: Not on file  . Food insecurity:    Worry: Not on file    Inability: Not on file  . Transportation needs:    Medical: Not on file    Non-medical: Not on file  Tobacco Use  . Smoking status: Former Smoker    Last attempt to quit: 04/12/1982    Years since quitting: 36.0  . Smokeless tobacco: Never Used  Substance and Sexual Activity  . Alcohol use: No    Alcohol/week: 0.0 standard drinks  . Drug use: No  . Sexual activity: Yes  Lifestyle  . Physical activity:    Days per week: Not on file    Minutes per session: Not on file  . Stress: Not on file  Relationships  . Social connections:    Talks on phone: Not on file    Gets together: Not on file    Attends religious service: Not on file    Active member of club or organization: Not on file    Attends meetings of clubs or organizations: Not on file    Relationship status: Not on file  .  Intimate partner violence:    Fear of current or ex partner: Not on file    Emotionally abused: Not on file    Physically abused: Not on file    Forced sexual activity: Not on file  Other Topics Concern  . Not on file  Social History Narrative  . Not on file    Outpatient Medications Prior to Visit  Medication Sig Dispense Refill  . aspirin 81 MG tablet Take 81 mg by mouth daily.    Marland Kitchen atorvastatin (LIPITOR) 80 MG tablet Take 1 tablet (80 mg total) by mouth daily at 6 PM. 30 tablet 0  . diphenhydrAMINE (BENADRYL) 25 MG tablet Take 25 mg by mouth at bedtime as needed.    . divalproex (DEPAKOTE ER) 500 MG 24 hr tablet 1 a day for a week, then 2/da 60 tablet 0  . eplerenone (INSPRA) 25 MG tablet Take 25 mg by mouth daily.    Marland Kitchen FLUoxetine (PROZAC) 40 MG capsule TAKE 1 CAPSULE BY MOUTH EVERY DAY 90 capsule 3  . nitroGLYCERIN (NITRODUR - DOSED IN MG/24 HR) 0.2 mg/hr patch Apply 1/4th patch over affected achilles, change daily 30 patch 1  .  nitroGLYCERIN (NITROSTAT) 0.4 MG SL tablet 1 (ONE) TABLET UNDER TONGUE EVERY 5 MINUTES AS NEEDED FOR CHEST PAIN.  1  . VALSARTAN PO Take 20 mg by mouth daily.     No facility-administered medications prior to visit.     No Known Allergies  ROS Review of Systems See hpi   Objective:    Physical Exam  BP 128/82 (BP Location: Left Arm, Patient Position: Sitting, Cuff Size: Normal)   Pulse 66   Temp 97.9 F (36.6 C) (Oral)   Resp 16   Ht _0  (1.753 m)   Wt 178 lb 6.4 oz (80.9 kg)   SpO2 100%   BMI 26.35 kg/m  Wt Readings from Last 3 Encounters:  04/05/18 178 lb 6.4 oz (80.9 kg)  12/22/17 172 lb (78 kg)  12/16/16 169 lb (76.7 kg)   Physical Exam  Constitutional: Oriented to person, place, and time. Appears well-developed and well-nourished.  HENT:  Head: Normocephalic and atraumatic.  Eyes: Conjunctivae and EOM are normal.  Cardiovascular: Normal rate, regular rhythm, normal heart sounds and intact distal pulses.  No murmur heard. Pulmonary/Chest: Effort normal and breath sounds normal. No stridor. No respiratory distress. Has no wheezes.  Neurological: Is alert and oriented to person, place, and time.  Skin: Skin is warm. Capillary refill takes less than 2 seconds.  Psychiatric: Has a normal mood and affect. Behavior is normal. Judgment and thought content normal.   GU Chaperone present  Right side bulge, no testicular mass Left side with surgical incision scar Soft abdomen  Health Maintenance Due  Topic Date Due  . COLONOSCOPY  03/16/2018    There are no preventive care reminders to display for this patient.  Lab Results  Component Value Date   TSH 3.800 12/16/2016   Lab Results  Component Value Date   WBC 6.3 12/16/2016   HGB 13.5 12/16/2016   HCT 42.1 12/16/2016   MCV 88 12/16/2016   PLT 282 12/16/2016   Lab Results  Component Value Date   NA 142 12/22/2017   K 4.8 12/22/2017   CO2 22 12/22/2017   GLUCOSE 104 (H) 12/22/2017   BUN 14  12/22/2017   CREATININE 0.99 12/22/2017   BILITOT 0.3 12/22/2017   ALKPHOS 65 12/22/2017   AST 20 12/22/2017   ALT 16  12/22/2017   PROT 6.5 12/22/2017   ALBUMIN 4.4 12/22/2017   CALCIUM 9.4 12/22/2017   ANIONGAP 3 (L) 04/26/2014   Lab Results  Component Value Date   CHOL 152 12/22/2017   Lab Results  Component Value Date   HDL 54 12/22/2017   Lab Results  Component Value Date   LDLCALC 87 12/22/2017   Lab Results  Component Value Date   TRIG 53 12/22/2017   Lab Results  Component Value Date   CHOLHDL 2.8 12/22/2017   Lab Results  Component Value Date   HGBA1C 5.6 12/22/2017      Assessment & Plan:   Problem List Items Addressed This Visit    None    Visit Diagnoses    Non-recurrent unilateral inguinal hernia without obstruction or gangrene    -  Primary   Relevant Orders   Ambulatory referral to General Surgery   History of hernia repair       Relevant Orders   Ambulatory referral to General Surgery   Groin pain, right       Relevant Orders   Ambulatory referral to General Surgery   Preoperative testing       Relevant Orders   CBC   CMP14+EGFR   POCT urinalysis dipstick (Completed)   EKG 12-Lead (Completed)    Referral placed for General Surgery for palpable hernia ECG nsr, RBBB sees Dr. Einar Gip Labs performed for preop testing to facilitate surgical repair  No orders of the defined types were placed in this encounter.   Follow-up: Return for follow up with General Surgery for hernia repair.    Forrest Moron, MD

## 2018-04-05 NOTE — Patient Instructions (Addendum)
   If you have lab work done today you will be contacted with your lab results within the next 2 weeks.  If you have not heard from us then please contact us. The fastest way to get your results is to register for My Chart.   IF you received an x-ray today, you will receive an invoice from [facility]. Please contact  Radiology at [phone] with questions or concerns regarding your invoice.   IF you received labwork today, you will receive an invoice from [facility]. Please contact [facility] at [phone] with questions or concerns regarding your invoice.   Our billing staff will not be able to assist you with questions regarding bills from these companies.  You will be contacted with the lab results as soon as they are available. The fastest way to get your results is to activate your My Chart account. Instructions are located on the last page of this paperwork. If you have not heard from us regarding the results in 2 weeks, please contact this office.     Inguinal Hernia, Adult An inguinal hernia is when fat or your intestines push through a weak spot in a muscle where your leg meets your lower belly (groin). This causes a rounded lump (bulge). This kind of hernia could also be:  In your scrotum, if you are male.  In folds of skin around your vagina, if you are male. There are three types of inguinal hernias. These include:  Hernias that can be pushed back into the belly (are reducible). This type rarely causes pain.  Hernias that cannot be pushed back into the belly (are incarcerated).  Hernias that cannot be pushed back into the belly and lose their blood supply (are strangulated). This type needs emergency surgery. If you do not have symptoms, you may not need treatment. If you have symptoms or a large hernia, you may need surgery. Follow these instructions at home: Lifestyle  Do these things if told by your doctor so you do not have trouble pooping  (constipation): ? Drink enough fluid to keep your pee (urine) pale yellow. ? Eat foods that have a lot of fiber. These include fresh fruits and vegetables, whole grains, and beans. ? Limit foods that are high in fat and processed sugars. These include foods that are fried or sweet. ? Take medicine for trouble pooping.  Avoid lifting heavy objects.  Avoid standing for long amounts of time.  Do not use any products that contain nicotine or tobacco. These include cigarettes and e-cigarettes. If you need help quitting, ask your doctor.  Stay at a healthy weight. General instructions  You may try to push your hernia in by very gently pressing on it when you are lying down. Do not try to force the bulge back in if it will not push in easily.  Watch your hernia for any changes in shape, size, or color. Tell your doctor if you see any changes.  Take over-the-counter and prescription medicines only as told by your doctor.  Keep all follow-up visits as told by your doctor. This is important. Contact a doctor if:  You have a fever.  You have new symptoms.  Your symptoms get worse. Get help right away if:  The area where your leg meets your lower belly has: ? Pain that gets worse suddenly. ? A bulge that gets bigger suddenly, and it does not get smaller after that. ? A bulge that turns red or purple. ? A bulge that is   painful when you touch it.  You are a man, and your scrotum: ? Suddenly feels painful. ? Suddenly changes in size.  You cannot push the hernia in by very gently pressing on it when you are lying down. Do not try to force the bulge back in if it will not push in easily.  You feel sick to your stomach (nauseous), and that feeling does not go away.  You throw up (vomit), and that keeps happening.  You have a fast heartbeat.  You cannot poop (have a bowel movement) or pass gas. These symptoms may be an emergency. Do not wait to see if the symptoms will go away. Get  medical help right away. Call your local emergency services (911 in the [place] Summary  An inguinal hernia is when fat or your intestines push through a weak spot in a muscle where your leg meets your lower belly (groin). This causes a rounded lump (bulge).  If you do not have symptoms, you may not need treatment. If you have symptoms or a large hernia, you may need surgery.  Avoid lifting heavy objects. Also avoid standing for long amounts of time.  Do not try to force the bulge back in if it will not push in easily. This information is not intended to replace advice given to you by your health care provider. Make sure you discuss any questions you have with your health care provider. Document Released: 04/02/2006 Document Revised: 04/03/2017 Document Reviewed: 12/02/2016 Elsevier Interactive Patient Education  2019 [employer].

## 2018-04-20 ENCOUNTER — Ambulatory Visit (INDEPENDENT_AMBULATORY_CARE_PROVIDER_SITE_OTHER): Payer: Federal, State, Local not specified - PPO | Admitting: Psychiatry

## 2018-04-20 DIAGNOSIS — F39 Unspecified mood [affective] disorder: Secondary | ICD-10-CM | POA: Diagnosis not present

## 2018-04-20 MED ORDER — DIVALPROEX SODIUM ER 500 MG PO TB24: ORAL_TABLET | ORAL | 0 refills | Status: DC

## 2018-04-20 MED ORDER — FLUOXETINE HCL 40 MG PO CAPS: 40.0000 mg | ORAL_CAPSULE | Freq: Every day | ORAL | 0 refills | Status: DC
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
## 2018-04-21 ENCOUNTER — Other Ambulatory Visit: Payer: Self-pay | Admitting: Psychiatry

## 2018-05-03 ENCOUNTER — Other Ambulatory Visit: Payer: Self-pay

## 2018-05-03 DIAGNOSIS — E785 Hyperlipidemia, unspecified: Secondary | ICD-10-CM

## 2018-05-03 MED ORDER — ATORVASTATIN CALCIUM 20 MG PO TABS: 20.0000 mg | ORAL_TABLET | Freq: Every day | ORAL | 4 refills | Status: DC

## 2018-05-14 ENCOUNTER — Other Ambulatory Visit: Payer: Self-pay | Admitting: Psychiatry

## 2018-05-23 ENCOUNTER — Other Ambulatory Visit: Payer: Self-pay

## 2018-05-23 ENCOUNTER — Ambulatory Visit: Payer: Federal, State, Local not specified - PPO | Admitting: Psychiatry

## 2018-05-23 ENCOUNTER — Ambulatory Visit (INDEPENDENT_AMBULATORY_CARE_PROVIDER_SITE_OTHER): Payer: Federal, State, Local not specified - PPO | Admitting: Psychiatry

## 2018-05-23 ENCOUNTER — Telehealth: Payer: Self-pay | Admitting: Family Medicine

## 2018-05-23 DIAGNOSIS — E785 Hyperlipidemia, unspecified: Secondary | ICD-10-CM

## 2018-05-23 DIAGNOSIS — F39 Unspecified mood [affective] disorder: Secondary | ICD-10-CM | POA: Diagnosis not present

## 2018-05-23 MED ORDER — ATORVASTATIN CALCIUM 20 MG PO TABS: 20.0000 mg | ORAL_TABLET | Freq: Every day | ORAL | 4 refills | Status: DC

## 2018-05-23 MED ORDER — DIVALPROEX SODIUM ER 500 MG PO TB24: ORAL_TABLET | ORAL | 0 refills | Status: DC

## 2018-05-23 MED ORDER — FLUOXETINE HCL 40 MG PO CAPS: 40.0000 mg | ORAL_CAPSULE | Freq: Every day | ORAL | 0 refills | Status: DC

## 2018-05-23 MED ORDER — ATORVASTATIN CALCIUM 20 MG PO TABS: 20.0000 mg | ORAL_TABLET | Freq: Every day | ORAL | 1 refills | Status: DC

## 2018-05-23 NOTE — Telephone Encounter (Signed)
Patient stated he needs a referral to a GI provider

## 2018-05-23 NOTE — Telephone Encounter (Signed)
Copied from [place] [id number]. Topic: Referral - Status >> May 23, 2018  4:33 PM Reyne Dumas L wrote: Reason for CRM:   Pt calling:  States that at last OV he was told he was being referred to GI, but hasn't heard from anyone.  Pt calling to check on this.

## 2018-05-24 ENCOUNTER — Other Ambulatory Visit: Payer: Self-pay

## 2018-05-24 ENCOUNTER — Encounter: Payer: Self-pay | Admitting: Internal Medicine

## 2018-05-24 DIAGNOSIS — Z1211 Encounter for screening for malignant neoplasm of colon: Secondary | ICD-10-CM

## 2018-05-24 NOTE — Telephone Encounter (Signed)
Referral sent for colonoscopy. Pt informed

## 2018-06-03 ENCOUNTER — Other Ambulatory Visit: Payer: Self-pay | Admitting: Podiatry

## 2018-06-03 ENCOUNTER — Ambulatory Visit (INDEPENDENT_AMBULATORY_CARE_PROVIDER_SITE_OTHER): Payer: Federal, State, Local not specified - PPO

## 2018-06-03 ENCOUNTER — Ambulatory Visit: Payer: Federal, State, Local not specified - PPO | Admitting: Podiatry

## 2018-06-03 ENCOUNTER — Other Ambulatory Visit: Payer: Self-pay

## 2018-06-03 ENCOUNTER — Encounter: Payer: Self-pay | Admitting: Podiatry

## 2018-06-03 DIAGNOSIS — M722 Plantar fascial fibromatosis: Secondary | ICD-10-CM

## 2018-06-03 DIAGNOSIS — M79671 Pain in right foot: Secondary | ICD-10-CM | POA: Diagnosis not present

## 2018-06-03 DIAGNOSIS — M7661 Achilles tendinitis, right leg: Secondary | ICD-10-CM | POA: Diagnosis not present

## 2018-06-03 MED ORDER — TRIAMCINOLONE ACETONIDE 10 MG/ML IJ SUSP
10.0000 mg | Freq: Once | INTRAMUSCULAR | Status: AC
  Administered 2018-06-03: 10 mg

## 2018-06-03 NOTE — Progress Notes (Signed)
   Subjective:    Patient ID: Edwin Sevin., male    DOB: August 24, 1945, 73 y.o.   MRN: [id number]  HPI    Review of Systems  All other systems reviewed and are negative.      Objective:   Physical Exam        Assessment & Plan:

## 2018-06-05 NOTE — Progress Notes (Signed)
Subjective:   Patient ID: Edwin Sevin., male   DOB: 73 y.o.   MRN: [id number]   HPI Patient presents stating he is having a lot of pain in the bottom of his right heel on the outside and that this is been going on for about 8 months and the likes to be very active playing golf and do stretching and states that it continues to be a problem.  He has had ultrasound has had good therapy he has had physical therapy without relief at the current time   Review of Systems  All other systems reviewed and are negative.       Objective:  Physical Exam Vitals signs and nursing note reviewed.  Constitutional:      Appearance: He is well-developed.  Pulmonary:     Effort: Pulmonary effort is normal.  Musculoskeletal: Normal range of motion.  Skin:    General: Skin is warm.  Neurological:     Mental Status: He is alert.     Neurovascular status found to be intact muscle strength is adequate range of motion within normal limits with patient noted to have exquisite discomfort in the plantar lateral aspect of the right heel and the proximal portion with also mild discomfort in the Achilles on the lateral side.  Patient is noted to have good digital perfusion is well oriented x3 but does walk with a limp when trying to ambulate especially when trying to twist the foot     Assessment:  Probability that this is an inflammatory condition even though I cannot rule out a condition associated with back or nerve compression which may be creating the symptoms he is experiencing     Plan:  H&P and spent a great deal time educating this condition with him and reviewed x-rays.  At this point I did a sterile prep and did a plantar lateral injection of 3 mg dexamethasone Kenalog 5 mg Xylocaine and continue boot usage with gradual reduction and discussed long-term orthotics.  Reappoint to recheck again in the next several weeks and we will decide if anything else is appropriate even though I do think he will  make a good orthotic candidate long-term  X-rays indicate that there is moderate depression of the arch but no indications of significant other pathology

## 2018-06-16 ENCOUNTER — Other Ambulatory Visit: Payer: Self-pay | Admitting: Psychiatry

## 2018-06-20 ENCOUNTER — Ambulatory Visit: Payer: Federal, State, Local not specified - PPO | Admitting: Podiatry

## 2018-06-20 ENCOUNTER — Encounter: Payer: Self-pay | Admitting: Podiatry

## 2018-06-20 ENCOUNTER — Ambulatory Visit: Payer: Federal, State, Local not specified - PPO | Admitting: Psychiatry

## 2018-06-20 ENCOUNTER — Other Ambulatory Visit: Payer: Self-pay

## 2018-06-20 VITALS — Temp 97.9°F

## 2018-06-20 DIAGNOSIS — M722 Plantar fascial fibromatosis: Secondary | ICD-10-CM | POA: Diagnosis not present

## 2018-06-20 DIAGNOSIS — M7661 Achilles tendinitis, right leg: Secondary | ICD-10-CM

## 2018-06-20 NOTE — Progress Notes (Signed)
Subjective:   Patient ID: Edwin Sevin., male   DOB: 73 y.o.   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  I do think it will be a Y type orthotic with lots of cushion with a deep heel seat and he is going to see ped orthotist to have these made.  I did recommend the consideration of night splint but at this point he is not confident will help him and states he does his own stretching will continue that treatment plan

## 2018-06-21 ENCOUNTER — Other Ambulatory Visit: Payer: Federal, State, Local not specified - PPO | Admitting: Orthotics

## 2018-06-21 DIAGNOSIS — M722 Plantar fascial fibromatosis: Secondary | ICD-10-CM | POA: Diagnosis not present

## 2018-06-21 DIAGNOSIS — M7661 Achilles tendinitis, right leg: Secondary | ICD-10-CM | POA: Diagnosis not present

## 2018-06-22 ENCOUNTER — Other Ambulatory Visit: Payer: Self-pay

## 2018-06-22 MED ORDER — DIVALPROEX SODIUM ER 500 MG PO TB24: 1500.0000 mg | ORAL_TABLET | Freq: Every day | ORAL | 0 refills | Status: DC

## 2018-06-28 ENCOUNTER — Encounter: Payer: Federal, State, Local not specified - PPO | Admitting: Internal Medicine

## 2018-07-06 ENCOUNTER — Encounter: Payer: Federal, State, Local not specified - PPO | Admitting: Internal Medicine

## 2018-07-11 ENCOUNTER — Other Ambulatory Visit: Payer: Self-pay

## 2018-07-11 ENCOUNTER — Ambulatory Visit: Payer: Medicare Other | Admitting: Orthotics

## 2018-07-11 DIAGNOSIS — Z0000 Encounter for general adult medical examination without abnormal findings: Secondary | ICD-10-CM

## 2018-07-11 DIAGNOSIS — M79671 Pain in right foot: Secondary | ICD-10-CM

## 2018-07-11 DIAGNOSIS — M7661 Achilles tendinitis, right leg: Secondary | ICD-10-CM

## 2018-07-11 DIAGNOSIS — M722 Plantar fascial fibromatosis: Secondary | ICD-10-CM

## 2018-07-11 NOTE — Progress Notes (Signed)
Patient came in today to pick up custom made foot orthotics.  The goals were accomplished and the patient reported no dissatisfaction with said orthotics.  Patient was advised of breakin period and how to report any issues. 

## 2018-07-14 ENCOUNTER — Other Ambulatory Visit: Payer: Self-pay | Admitting: Emergency Medicine

## 2018-07-14 DIAGNOSIS — M7661 Achilles tendinitis, right leg: Secondary | ICD-10-CM

## 2018-09-14 ENCOUNTER — Ambulatory Visit: Payer: Medicare Other | Admitting: Emergency Medicine

## 2018-11-08 ENCOUNTER — Other Ambulatory Visit: Payer: Self-pay | Admitting: Emergency Medicine

## 2018-11-08 NOTE — Telephone Encounter (Signed)
Requested medication (s) are due for refill today: yes  Requested medication (s) are on the active medication list: yes  Last refill: 05/23/2018  Future visit scheduled: no  Notes to clinic:  pcp is different than ordering provider    Requested Prescriptions  Pending Prescriptions Disp Refills   FLUoxetine (PROZAC) 40 MG capsule [Pharmacy Med Name: FLUOXETINE HCL 40 MG CAPSULE] 90 capsule 3    Sig: TAKE 1 Waterproof     Psychiatry:  Antidepressants - SSRI Failed - 11/08/2018  1:20 AM      Failed - Valid encounter within last 6 months    Recent Outpatient Visits          7 months ago Non-recurrent unilateral inguinal hernia without obstruction or gangrene   [facility], Arlie Solomons, MD   10 months ago Routine general medical examination at a health care facility   [facility] at [place], Ines Bloomer, MD   1 year ago Routine general medical examination at a health care facility   [facility] [place], [place], MD   2 years ago Community acquired pneumonia of right lower lobe of lung [facility]   [facility] at [place], MD   2 years ago Community acquired pneumonia of right lower lobe of lung [facility])   [facility] at [facility], Gelene Mink, PA-C      Future Appointments            In 1 month Adrian Prows, MD [facility]           Passed - Completed PHQ-2 or PHQ-9 in the last 360 days.

## 2018-11-13 ENCOUNTER — Other Ambulatory Visit: Payer: Self-pay | Admitting: Cardiology

## 2018-11-13 DIAGNOSIS — E785 Hyperlipidemia, unspecified: Secondary | ICD-10-CM

## 2018-11-16 ENCOUNTER — Other Ambulatory Visit: Payer: Self-pay

## 2018-11-16 MED ORDER — VALSARTAN 320 MG PO TABS: 320.0000 mg | ORAL_TABLET | Freq: Every day | ORAL | 1 refills | Status: DC

## 2018-12-26 ENCOUNTER — Encounter: Payer: Medicare Other | Admitting: Emergency Medicine

## 2018-12-28 ENCOUNTER — Other Ambulatory Visit: Payer: Self-pay

## 2018-12-28 ENCOUNTER — Encounter: Payer: Self-pay | Admitting: Emergency Medicine

## 2018-12-28 ENCOUNTER — Ambulatory Visit (INDEPENDENT_AMBULATORY_CARE_PROVIDER_SITE_OTHER): Payer: Federal, State, Local not specified - PPO | Admitting: Emergency Medicine

## 2018-12-28 VITALS — BP 117/77 | HR 63 | Temp 97.3°F | Resp 16 | Ht 70.0 in | Wt 165.6 lb

## 2018-12-28 DIAGNOSIS — Z8639 Personal history of other endocrine, nutritional and metabolic disease: Secondary | ICD-10-CM

## 2018-12-28 DIAGNOSIS — Z8679 Personal history of other diseases of the circulatory system: Secondary | ICD-10-CM

## 2018-12-28 DIAGNOSIS — Z0000 Encounter for general adult medical examination without abnormal findings: Secondary | ICD-10-CM

## 2018-12-28 DIAGNOSIS — Z1329 Encounter for screening for other suspected endocrine disorder: Secondary | ICD-10-CM | POA: Diagnosis not present

## 2018-12-28 DIAGNOSIS — Z8659 Personal history of other mental and behavioral disorders: Secondary | ICD-10-CM

## 2018-12-28 DIAGNOSIS — Z23 Encounter for immunization: Secondary | ICD-10-CM

## 2018-12-28 DIAGNOSIS — Z130 Encounter for screening for diseases of the blood and blood-forming organs and certain disorders involving the immune mechanism: Secondary | ICD-10-CM

## 2018-12-28 DIAGNOSIS — Z13228 Encounter for screening for other metabolic disorders: Secondary | ICD-10-CM

## 2018-12-28 MED ORDER — FLUOXETINE HCL 40 MG PO CAPS: 40.0000 mg | ORAL_CAPSULE | Freq: Every day | ORAL | 3 refills | Status: DC

## 2018-12-28 NOTE — Progress Notes (Signed)
Edwin Mckee. 73 y.o.   Chief Complaint  Patient presents with  . Annual Exam  . Medication Refill    HISTORY OF PRESENT ILLNESS: This is a 73 y.o. male male here for his annual exam and medication refill. 1.  Ischemic heart disease.  Doing well.  Takes baby aspirin daily.  Sees cardiologist regularly.  No complaints. 2.  Hypertension: Takes valsartan 320 mg daily and hydrochlorothiazide 12.5 mg daily. 3.  Dyslipidemia: On atorvastatin 20 mg daily. 4.  History of depression: On Prozac 40 mg daily.  Doing well.  No complaints. Has noticed some bowel movement irregularity in the past 1 to 2 weeks.  Was scheduled for colonoscopy earlier this year but canceled due to COVID, needs to reschedule. No other complaints or medical concerns today. Healthy lifestyle.  Physically active with good nutrition. HPI   Prior to Admission medications   Medication Sig Start Date End Date Taking? Authorizing Provider  aspirin 81 MG tablet Take 81 mg by mouth daily.   Yes [provider]  atorvastatin (LIPITOR) 20 MG tablet TAKE 1 TABLET BY MOUTH EVERY DAY 11/14/18  Yes Adrian Prows, MD  diphenhydrAMINE (BENADRYL) 25 MG tablet Take 25 mg by mouth at bedtime as needed.   Yes [provider]  eplerenone (INSPRA) 25 MG tablet Take 25 mg by mouth daily.   Yes [provider]  FLUoxetine (PROZAC) 40 MG capsule Take 1 capsule (40 mg total) by mouth daily. 05/23/18  Yes Shugart, Lissa Hoard, PA-C  hydrochlorothiazide (MICROZIDE) 12.5 MG capsule Take 12.5 mg by mouth every morning. 06/14/18  Yes [provider]  valsartan (DIOVAN) 320 MG tablet Take 1 tablet (320 mg total) by mouth daily. 11/16/18  Yes Adrian Prows, MD  divalproex (DEPAKOTE ER) 500 MG 24 hr tablet Take 3 tablets (1,500 mg total) by mouth daily. 06/22/18   Shugart, Lissa Hoard, PA-C  nitroGLYCERIN (NITRODUR - DOSED IN MG/24 HR) 0.2 mg/hr patch APPLY 1/4TH PATCH OVER AFFECTED ACHILLES AS DIRECTED AND CHANGE DAILY 07/14/18   Horald Pollen, MD  nitroGLYCERIN (NITROSTAT) 0.4 MG SL tablet 1 (ONE) TABLET UNDER TONGUE EVERY 5 MINUTES AS NEEDED FOR CHEST PAIN. 08/20/15   [provider]    No Known Allergies  Patient Active Problem List   Diagnosis Date Noted  . IHD (ischemic heart disease) 12/16/2016  . History of MI (myocardial infarction) 12/16/2016  . History of depression 12/16/2016  . Hearing loss 05/04/2012  . HYPERLIPIDEMIA 01/26/2008  . Essential hypertension 01/26/2008  . INGUINAL HERNIA, HX OF 01/26/2008    Past Medical History:  Diagnosis Date  . Arthritis   . Depression   . Hypertension   . NSTEMI (non-ST elevated myocardial infarction) ([place]) 04/25/2014  . Substance abuse [facility])     Past Surgical History:  Procedure Laterality Date  . HERNIA REPAIR    . LEFT HEART CATHETERIZATION WITH CORONARY ANGIOGRAM N/A 04/25/2014   Procedure: LEFT HEART CATHETERIZATION WITH CORONARY ANGIOGRAM;  Surgeon: Laverda Page, MD;  Location: [facility] CATH LAB;  Service: Cardiovascular;  Laterality: N/A;  . VASECTOMY      Social History   Socioeconomic History  . Marital status: Married    Spouse name: Not on file  . Number of children: Not on file  . Years of education: Not on file  . Highest education level: Not on file  Occupational History  . Not on file  Social Needs  . Financial resource strain: Not on file  . Food insecurity  Worry: Not on file    Inability: Not on file  . Transportation needs    Medical: Not on file    Non-medical: Not on file  Tobacco Use  . Smoking status: Former Smoker    Quit date: 04/12/1982    Years since quitting: 36.7  . Smokeless tobacco: Never Used  Substance and Sexual Activity  . Alcohol use: No    Alcohol/week: 0.0 standard drinks  . Drug use: No  . Sexual activity: Yes  Lifestyle  . Physical activity    Days per week: Not on file    Minutes per session: Not on file  . Stress: Not on file  Relationships  . Social Herbalist on phone:  Not on file    Gets together: Not on file    Attends religious service: Not on file    Active member of club or organization: Not on file    Attends meetings of clubs or organizations: Not on file    Relationship status: Not on file  . Intimate partner violence    Fear of current or ex partner: Not on file    Emotionally abused: Not on file    Physically abused: Not on file    Forced sexual activity: Not on file  Other Topics Concern  . Not on file  Social History Narrative  . Not on file    Family History  Problem Relation Age of Onset  . Cancer Mother        lung  . Hypertension Father   . Alzheimer's disease Father   . Heart disease Sister   . Heart disease Brother   . Stroke Maternal Grandmother   . Alcohol abuse Maternal Grandfather   . Cancer Maternal Grandfather   . Stroke Paternal Grandmother   . Cancer Paternal Grandfather      Review of Systems  Constitutional: Negative.  Negative for chills, fever and weight loss.  HENT: Negative.  Negative for sore throat.   Eyes: Negative.  Negative for blurred vision.  Respiratory: Negative.  Negative for cough and shortness of breath.   Cardiovascular: Negative.  Negative for chest pain and palpitations.  Gastrointestinal: Negative.  Negative for abdominal pain, diarrhea, nausea and vomiting.  Genitourinary: Negative.        No LUTS.  Musculoskeletal: Negative.   Skin: Negative.  Negative for rash.  Neurological: Negative.  Negative for dizziness and headaches.  Endo/Heme/Allergies: Negative.   All other systems reviewed and are negative.   Vitals:   12/28/18 0808  BP: 117/77  Pulse: 63  Resp: 16  Temp: (!) 97.3 F (36.3 C)  SpO2: 95%    Physical Exam Vitals signs reviewed.  Constitutional:      Appearance: Normal appearance.  HENT:     Head: Normocephalic.  Eyes:     Extraocular Movements: Extraocular movements intact.     Conjunctiva/sclera: Conjunctivae normal.     Pupils: Pupils are equal, round,  and reactive to light.  Neck:     Musculoskeletal: Normal range of motion and neck supple.  Cardiovascular:     Rate and Rhythm: Normal rate and regular rhythm.     Pulses: Normal pulses.     Heart sounds: Normal heart sounds.  Pulmonary:     Effort: Pulmonary effort is normal.     Breath sounds: Normal breath sounds.  Abdominal:     General: Bowel sounds are normal. There is no distension.     Palpations: Abdomen is soft.  Tenderness: There is no abdominal tenderness.  Musculoskeletal: Normal range of motion.     Right lower leg: No edema.     Left lower leg: No edema.  Skin:    General: Skin is warm and dry.     Capillary Refill: Capillary refill takes less than 2 seconds.  Neurological:     General: No focal deficit present.     Mental Status: He is alert and oriented to person, place, and time.  Psychiatric:        Mood and Affect: Mood normal.        Behavior: Behavior normal.    Clinically stable.  No medical concerns identified during this visit.  ASSESSMENT & PLAN: Sloane was seen today for annual exam and medication refill.  Diagnoses and all orders for this visit:  Routine general medical examination at a health care facility  Screening for endocrine, metabolic and immunity disorder -     TSH  History of depression -     FLUoxetine (PROZAC) 40 MG capsule; Take 1 capsule (40 mg total) by mouth daily.  History of hypertension -     Comprehensive metabolic panel  History of high cholesterol -     Lipid panel  Need for prophylactic vaccination and inoculation against influenza -     Flu Vaccine QUAD High Dose(Fluad)    Patient Instructions       If you have lab work done today you will be contacted with your lab results within the next 2 weeks.  If you have not heard from [place] then please contact us. The fastest way to get your results is to register for My Chart.   IF you received an x-ray today, you will receive an invoice from [facility]. Please contact [facility] at [phone] with questions or concerns regarding your invoice.   IF you received labwork today, you will receive an invoice from [facility]. Please contact [facility] at [phone] with questions or concerns regarding your invoice.   Our billing staff will not be able to assist you with questions regarding bills from these companies.  You will be contacted with the lab results as soon as they are available. The fastest way to get your results is to activate your My Chart account. Instructions are located on the last page of this paperwork. If you have not heard from [place] regarding the results in 2 weeks, please contact this office.      Health Maintenance, Male Adopting a healthy lifestyle and getting preventive care are important in promoting health and wellness. Ask your health care provider about:  The right schedule for you to have regular tests and exams.  Things you can do on your own to prevent diseases and keep yourself healthy. What should I know about diet, weight, and exercise? Eat a healthy diet   Eat a diet that includes plenty of vegetables, fruits, low-fat dairy products, and lean protein.  Do not eat a lot of foods that are high in solid fats, added sugars, or sodium. Maintain a healthy weight Body mass index (BMI) is a measurement that can be used to identify possible weight problems. It estimates body fat based on height and weight. Your health care provider can help determine your BMI and help you achieve or maintain a healthy weight. Get regular exercise Get regular exercise. This is one of the most important things you can do for your health. Most adults should:  Exercise for at least 150 minutes each week.  The exercise should increase your heart rate and make you sweat (moderate-intensity exercise).  Do strengthening exercises at least twice a week. This is in addition to the moderate-intensity exercise.  Spend less  time sitting. Even light physical activity can be beneficial. Watch cholesterol and blood lipids Have your blood tested for lipids and cholesterol at 73 years of age, then have this test every 5 years. You may need to have your cholesterol levels checked more often if:  Your lipid or cholesterol levels are high.  You are older than 71 years of age.  You are at high risk for heart disease. What should I know about cancer screening? Many types of cancers can be detected early and may often be prevented. Depending on your health history and family history, you may need to have cancer screening at various ages. This may include screening for:  Colorectal cancer.  Prostate cancer.  Skin cancer.  Lung cancer. What should I know about heart disease, diabetes, and high blood pressure? Blood pressure and heart disease  High blood pressure causes heart disease and increases the risk of stroke. This is more likely to develop in people who have high blood pressure readings, are of African descent, or are overweight.  Talk with your health care provider about your target blood pressure readings.  Have your blood pressure checked: ? Every 3-5 years if you are 81-23 years of age. ? Every year if you are 32 years old or older.  If you are between the ages of 68 and 32 and are a current or former smoker, ask your health care provider if you should have a one-time screening for abdominal aortic aneurysm (AAA). Diabetes Have regular diabetes screenings. This checks your fasting blood sugar level. Have the screening done:  Once every three years after age 70 if you are at a normal weight and have a low risk for diabetes.  More often and at a younger age if you are overweight or have a high risk for diabetes. What should I know about preventing infection? Hepatitis B If you have a higher risk for hepatitis B, you should be screened for this virus. Talk with your health care provider to find out if  you are at risk for hepatitis B infection. Hepatitis C Blood testing is recommended for:  Everyone born from [birth year] through 1963.  Anyone with known risk factors for hepatitis C. Sexually transmitted infections (STIs)  You should be screened each year for STIs, including gonorrhea and chlamydia, if: ? You are sexually active and are younger than 71 years of age. ? You are older than 71 years of age and your health care provider tells you that you are at risk for this type of infection. ? Your sexual activity has changed since you were last screened, and you are at increased risk for chlamydia or gonorrhea. Ask your health care provider if you are at risk.  Ask your health care provider about whether you are at high risk for HIV. Your health care provider may recommend a prescription medicine to help prevent HIV infection. If you choose to take medicine to prevent HIV, you should first get tested for HIV. You should then be tested every 3 months for as long as you are taking the medicine. Follow these instructions at home: Lifestyle  Do not use any products that contain nicotine or tobacco, such as cigarettes, e-cigarettes, and chewing tobacco. If you need help quitting, ask your health care provider.  Do not use  street drugs.  Do not share needles.  Ask your health care provider for help if you need support or information about quitting drugs. Alcohol use  Do not drink alcohol if your health care provider tells you not to drink.  If you drink alcohol: ? Limit how much you have to 0-2 drinks a day. ? Be aware of how much alcohol is in your drink. In the [place] one drink equals one 12 oz bottle of beer (355 mL), one 5 oz glass of wine (148 mL), or one 1 oz glass of hard liquor (44 mL). General instructions  Schedule regular health, dental, and eye exams.  Stay current with your vaccines.  Tell your health care provider if: ? You often feel depressed. ? You have ever been abused or  do not feel safe at home. Summary  Adopting a healthy lifestyle and getting preventive care are important in promoting health and wellness.  Follow your health care provider's instructions about healthy diet, exercising, and getting tested or screened for diseases.  Follow your health care provider's instructions on monitoring your cholesterol and blood pressure. This information is not intended to replace advice given to you by your health care provider. Make sure you discuss any questions you have with your health care provider. Document Released: 08/29/2007 Document Revised: 02/23/2018 Document Reviewed: 02/23/2018 Elsevier Patient Education  2020 Elsevier Inc.      Agustina Caroli, MD [facility]

## 2018-12-28 NOTE — Patient Instructions (Addendum)
   If you have lab work done today you will be contacted with your lab results within the next 2 weeks.  If you have not heard from us then please contact us. The fastest way to get your results is to register for My Chart.   IF you received an x-ray today, you will receive an invoice from [facility]. Please contact [facility] at [phone] with questions or concerns regarding your invoice.   IF you received labwork today, you will receive an invoice from [facility]. Please contact [facility] at [phone] with questions or concerns regarding your invoice.   Our billing staff will not be able to assist you with questions regarding bills from these companies.  You will be contacted with the lab results as soon as they are available. The fastest way to get your results is to activate your My Chart account. Instructions are located on the last page of this paperwork. If you have not heard from us regarding the results in 2 weeks, please contact this office.     Health Maintenance, Male Adopting a healthy lifestyle and getting preventive care are important in promoting health and wellness. Ask your health care provider about:  The right schedule for you to have regular tests and exams.  Things you can do on your own to prevent diseases and keep yourself healthy. What should I know about diet, weight, and exercise? Eat a healthy diet   Eat a diet that includes plenty of vegetables, fruits, low-fat dairy products, and lean protein.  Do not eat a lot of foods that are high in solid fats, added sugars, or sodium. Maintain a healthy weight Body mass index (BMI) is a measurement that can be used to identify possible weight problems. It estimates body fat based on height and weight. Your health care provider can help determine your BMI and help you achieve or maintain a healthy weight. Get regular exercise Get regular exercise. This is one of the most important things you  can do for your health. Most adults should:  Exercise for at least 150 minutes each week. The exercise should increase your heart rate and make you sweat (moderate-intensity exercise).  Do strengthening exercises at least twice a week. This is in addition to the moderate-intensity exercise.  Spend less time sitting. Even light physical activity can be beneficial. Watch cholesterol and blood lipids Have your blood tested for lipids and cholesterol at 73 years of age, then have this test every 5 years. You may need to have your cholesterol levels checked more often if:  Your lipid or cholesterol levels are high.  You are older than 71 years of age.  You are at high risk for heart disease. What should I know about cancer screening? Many types of cancers can be detected early and may often be prevented. Depending on your health history and family history, you may need to have cancer screening at various ages. This may include screening for:  Colorectal cancer.  Prostate cancer.  Skin cancer.  Lung cancer. What should I know about heart disease, diabetes, and high blood pressure? Blood pressure and heart disease  High blood pressure causes heart disease and increases the risk of stroke. This is more likely to develop in people who have high blood pressure readings, are of African descent, or are overweight.  Talk with your health care provider about your target blood pressure readings.  Have your blood pressure checked: ? Every 3-5 years if you are 18-39 years   of age. ? Every year if you are 40 years old or older.  If you are between the ages of 65 and 75 and are a current or former smoker, ask your health care provider if you should have a one-time screening for abdominal aortic aneurysm (AAA). Diabetes Have regular diabetes screenings. This checks your fasting blood sugar level. Have the screening done:  Once every three years after age 45 if you are at a normal weight and have  a low risk for diabetes.  More often and at a younger age if you are overweight or have a high risk for diabetes. What should I know about preventing infection? Hepatitis B If you have a higher risk for hepatitis B, you should be screened for this virus. Talk with your health care provider to find out if you are at risk for hepatitis B infection. Hepatitis C Blood testing is recommended for:  Everyone born from 1945 through 1965.  Anyone with known risk factors for hepatitis C. Sexually transmitted infections (STIs)  You should be screened each year for STIs, including gonorrhea and chlamydia, if: ? You are sexually active and are younger than 71 years of age. ? You are older than 71 years of age and your health care provider tells you that you are at risk for this type of infection. ? Your sexual activity has changed since you were last screened, and you are at increased risk for chlamydia or gonorrhea. Ask your health care provider if you are at risk.  Ask your health care provider about whether you are at high risk for HIV. Your health care provider may recommend a prescription medicine to help prevent HIV infection. If you choose to take medicine to prevent HIV, you should first get tested for HIV. You should then be tested every 3 months for as long as you are taking the medicine. Follow these instructions at home: Lifestyle  Do not use any products that contain nicotine or tobacco, such as cigarettes, e-cigarettes, and chewing tobacco. If you need help quitting, ask your health care provider.  Do not use street drugs.  Do not share needles.  Ask your health care provider for help if you need support or information about quitting drugs. Alcohol use  Do not drink alcohol if your health care provider tells you not to drink.  If you drink alcohol: ? Limit how much you have to 0-2 drinks a day. ? Be aware of how much alcohol is in your drink. In the [place] one drink equals one 12  oz bottle of beer (355 mL), one 5 oz glass of wine (148 mL), or one 1 oz glass of hard liquor (44 mL). General instructions  Schedule regular health, dental, and eye exams.  Stay current with your vaccines.  Tell your health care provider if: ? You often feel depressed. ? You have ever been abused or do not feel safe at home. Summary  Adopting a healthy lifestyle and getting preventive care are important in promoting health and wellness.  Follow your health care provider's instructions about healthy diet, exercising, and getting tested or screened for diseases.  Follow your health care provider's instructions on monitoring your cholesterol and blood pressure. This information is not intended to replace advice given to you by your health care provider. Make sure you discuss any questions you have with your health care provider. Document Released: 08/29/2007 Document Revised: 02/23/2018 Document Reviewed: 02/23/2018 Elsevier Patient Education  2020 [employer] Inc.  

## 2018-12-29 ENCOUNTER — Encounter: Payer: Self-pay | Admitting: Emergency Medicine

## 2018-12-29 LAB — COMPREHENSIVE METABOLIC PANEL
ALT: 26 IU/L (ref 0–44)
AST: 38 IU/L (ref 0–40)
Albumin/Globulin Ratio: 1.6 (ref 1.2–2.2)
Albumin: 4.6 g/dL (ref 3.8–4.8)
Alkaline Phosphatase: 86 IU/L (ref 39–117)
BUN/Creatinine Ratio: 15 (ref 10–24)
BUN: 16 mg/dL (ref 8–27)
Bilirubin Total: 0.5 mg/dL (ref 0.0–1.2)
CO2: 23 mmol/L (ref 20–29)
Calcium: 9.9 mg/dL (ref 8.6–10.2)
Chloride: 99 mmol/L (ref 96–106)
Creatinine, Ser: 1.06 mg/dL (ref 0.76–1.27)
GFR calc Af Amer: 82 mL/min/{1.73_m2} (ref >59)
GFR calc non Af Amer: 71 mL/min/{1.73_m2} (ref >59)
Globulin, Total: 2.9 g/dL (ref 1.5–4.5)
Glucose: 106 mg/dL — ABNORMAL HIGH (ref 65–99)
Potassium: 4.7 mmol/L (ref 3.5–5.2)
Sodium: 138 mmol/L (ref 134–144)
Total Protein: 7.5 g/dL (ref 6.0–8.5)

## 2018-12-29 LAB — LIPID PANEL
Chol/HDL Ratio: 3.1 ratio (ref 0.0–5.0)
Cholesterol, Total: 167 mg/dL (ref 100–199)
HDL: 54 mg/dL (ref >39)
LDL Chol Calc (NIH): 100 mg/dL — ABNORMAL HIGH (ref 0–99)
Triglycerides: 69 mg/dL (ref 0–149)
VLDL Cholesterol Cal: 13 mg/dL (ref 5–40)

## 2018-12-29 LAB — TSH: TSH: 5.47 u[IU]/mL — ABNORMAL HIGH (ref 0.450–4.500)

## 2019-01-04 ENCOUNTER — Encounter: Payer: Self-pay | Admitting: Cardiology

## 2019-01-04 ENCOUNTER — Ambulatory Visit: Payer: Federal, State, Local not specified - PPO | Admitting: Cardiology

## 2019-01-04 ENCOUNTER — Other Ambulatory Visit: Payer: Self-pay

## 2019-01-04 VITALS — BP 135/87 | HR 80 | Temp 95.1°F | Ht 70.0 in | Wt 171.0 lb

## 2019-01-04 DIAGNOSIS — I2510 Atherosclerotic heart disease of native coronary artery without angina pectoris: Secondary | ICD-10-CM

## 2019-01-04 DIAGNOSIS — R739 Hyperglycemia, unspecified: Secondary | ICD-10-CM | POA: Diagnosis not present

## 2019-01-04 DIAGNOSIS — I10 Essential (primary) hypertension: Secondary | ICD-10-CM

## 2019-01-04 DIAGNOSIS — E7800 Pure hypercholesterolemia, unspecified: Secondary | ICD-10-CM

## 2019-01-04 MED ORDER — NITROGLYCERIN 0.4 MG SL SUBL: 0.4000 mg | SUBLINGUAL_TABLET | SUBLINGUAL | 1 refills | Status: DC | PRN

## 2019-01-04 MED ORDER — HYDROCHLOROTHIAZIDE 12.5 MG PO CAPS: 12.5000 mg | ORAL_CAPSULE | Freq: Every morning | ORAL | 3 refills | Status: DC

## 2019-01-04 MED ORDER — ATORVASTATIN CALCIUM 40 MG PO TABS: 20.0000 mg | ORAL_TABLET | Freq: Every day | ORAL | 3 refills | Status: DC

## 2019-01-04 MED ORDER — EPLERENONE 25 MG PO TABS: 25.0000 mg | ORAL_TABLET | Freq: Every day | ORAL | 3 refills | Status: DC

## 2019-01-04 MED ORDER — VALSARTAN 320 MG PO TABS: 320.0000 mg | ORAL_TABLET | Freq: Every day | ORAL | 3 refills | Status: DC

## 2019-01-04 NOTE — Progress Notes (Signed)
Primary Physician/Referring:  Horald Pollen, MD  Patient ID: Edwin Sevin., male    DOB: 1945-07-23, 73 y.o.   MRN: [id number]  Chief Complaint  Patient presents with  . Hypertension  . Hyperlipidemia  . Follow-up    1 year c/o dizziness  . Coronary Artery Disease   HPI:    Edwin Faatz.  is a 73 y.o. Caucasian male with known coronary artery disease with history of non-STEMI and PTCA 04/25/2014 S/P stenting of the proximal and mid LAD with implantation of a 3.0 x 75mm resolute DES and balloon angioplasty of the D1 with 2.5 x 15 mm balloon. Otherwise diffuse mild disease in other vessels, LVEF 50-55%. He also has hypertension and orthostatic hypotension and mild hyperlipidemia.   He presents for one year office visit.  BP is mostly well controlled at home and has had occasional low BP and dizziness. He has occasional chest tightness left sided lasts a few seconds. Continues to be active.   Past Medical History:  Diagnosis Date  . Arthritis   . Depression   . Hypertension   . NSTEMI (non-ST elevated myocardial infarction) ([place]) 04/25/2014  . Substance abuse [facility])    Past Surgical History:  Procedure Laterality Date  . HERNIA REPAIR    . LEFT HEART CATHETERIZATION WITH CORONARY ANGIOGRAM N/A 04/25/2014   Procedure: LEFT HEART CATHETERIZATION WITH CORONARY ANGIOGRAM;  Surgeon: Laverda Page, MD;  Location: [facility] CATH LAB;  Service: Cardiovascular;  Laterality: N/A;  . VASECTOMY     Social History   Socioeconomic History  . Marital status: Married    Spouse name: Not on file  . Number of children: 2  . Years of education: Not on file  . Highest education level: Not on file  Occupational History  . Not on file  Social Needs  . Financial resource strain: Not on file  . Food insecurity    Worry: Not on file    Inability: Not on file  . Transportation needs    Medical: Not on file    Non-medical: Not on file  Tobacco Use  . Smoking status: Former  Smoker    Packs/day: 0.50    Years: 10.00    Pack years: 5.00    Quit date: 04/12/1982    Years since quitting: 36.7  . Smokeless tobacco: Never Used  Substance and Sexual Activity  . Alcohol use: No    Alcohol/week: 0.0 standard drinks  . Drug use: No  . Sexual activity: Yes  Lifestyle  . Physical activity    Days per week: Not on file    Minutes per session: Not on file  . Stress: Not on file  Relationships  . Social Herbalist on phone: Not on file    Gets together: Not on file    Attends religious service: Not on file    Active member of club or organization: Not on file    Attends meetings of clubs or organizations: Not on file    Relationship status: Not on file  . Intimate partner violence    Fear of current or ex partner: Not on file    Emotionally abused: Not on file    Physically abused: Not on file    Forced sexual activity: Not on file  Other Topics Concern  . Not on file  Social History Narrative  . Not on file   ROS  Review of Systems  Constitution: Negative for chills, decreased  appetite, malaise/fatigue and weight gain.  HENT: Positive for hearing loss.   Cardiovascular: Negative for dyspnea on exertion, leg swelling and syncope.  Endocrine: Negative for cold intolerance.  Hematologic/Lymphatic: Does not bruise/bleed easily.  Musculoskeletal: Positive for joint pain. Negative for joint swelling.  Gastrointestinal: Negative for abdominal pain, anorexia, change in bowel habit, hematochezia and melena.  Neurological: Positive for dizziness. Negative for headaches and light-headedness.  Psychiatric/Behavioral: Negative for depression and substance abuse.  All other systems reviewed and are negative.  Objective   Vitals with BMI 01/04/2019 01/04/2019 01/04/2019  Height 5\' 10"  5\' 10"  5\' 10"   Weight 171 lbs 171 lbs 171 lbs  BMI 24.54 123XX123 [id number]  Systolic [id number]  Diastolic 87 85 79  Pulse 80 67 65     Physical Exam  HENT:  Head:  Atraumatic.  Eyes: Conjunctivae are normal.  Neck: Neck supple. No JVD present. No thyromegaly present.  Cardiovascular: Normal rate, regular rhythm, normal heart sounds and intact distal pulses. Exam reveals no gallop.  No murmur heard. No leg edema, no JVD.  Pulmonary/Chest: Effort normal and breath sounds normal.  Mild pectus excavatum noted  Abdominal: Soft. Bowel sounds are normal.  Musculoskeletal: Normal range of motion.  Neurological: He is alert.  Skin: Skin is warm and dry.  Psychiatric: He has a normal mood and affect.   Radiology: No results found.  Laboratory examination:   Recent Labs    04/05/18 1122 12/28/18 1023  NA 141 138  K 4.5 4.7  CL 101 99  CO2 24 23  GLUCOSE 99 106*  BUN 13 16  CREATININE 1.01 1.06  CALCIUM 9.7 9.9  GFRNONAA 75 71  GFRAA 87 82   CMP Latest Ref Rng & Units 12/28/2018 04/05/2018 12/22/2017  Glucose 65 - 99 mg/dL 106(H) 99 104(H)  BUN 8 - 27 mg/dL 16 13 14   Creatinine 0.76 - 1.27 mg/dL 1.06 1.01 0.99  Sodium 134 - 144 mmol/L 138 141 142  Potassium 3.5 - 5.2 mmol/L 4.7 4.5 4.8  Chloride 96 - 106 mmol/L 99 101 103  CO2 20 - 29 mmol/L 23 24 22   Calcium 8.6 - 10.2 mg/dL 9.9 9.7 9.4  Total Protein 6.0 - 8.5 g/dL 7.5 6.6 6.5  Total Bilirubin 0.0 - 1.2 mg/dL 0.5 0.3 0.3  Alkaline Phos 39 - 117 IU/L 86 54 65  AST 0 - 40 IU/L 38 28 20  ALT 0 - 44 IU/L 26 27 16    CBC Latest Ref Rng & Units 04/05/2018 12/16/2016 04/26/2014  WBC 3.4 - 10.8 x10E3/uL 7.2 6.3 6.0  Hemoglobin 13.0 - 17.7 g/dL 13.5 13.5 11.7(L)  Hematocrit 37.5 - 51.0 % 39.8 42.1 34.8(L)  Platelets 150 - 450 x10E3/uL 230 282 179   Lipid Panel     Component Value Date/Time   CHOL 167 12/28/2018 1023   TRIG 69 12/28/2018 1023   HDL 54 12/28/2018 1023   CHOLHDL 3.1 12/28/2018 1023   CHOLHDL 5.2 05/10/2013 1148   VLDL 27 05/10/2013 1148   LDLCALC 100 (H) 12/28/2018 1023   HEMOGLOBIN A1C Lab Results  Component Value Date   HGBA1C 5.6 12/22/2017   TSH Recent Labs     12/28/18 1023  TSH 5.470*   Medications and allergies  No Known Allergies   Prior to Admission medications   Medication Sig Start Date End Date Taking? Authorizing Provider  aspirin 81 MG tablet Take 81 mg by mouth daily.   Yes [provider]  atorvastatin (LIPITOR) 20 MG  tablet TAKE 1 TABLET BY MOUTH EVERY DAY 11/14/18  Yes Adrian Prows, MD  diphenhydrAMINE (BENADRYL) 25 MG tablet Take 25 mg by mouth at bedtime as needed.   Yes [provider]  eplerenone (INSPRA) 25 MG tablet Take 25 mg by mouth daily.   Yes [provider]  FLUoxetine (PROZAC) 40 MG capsule Take 1 capsule (40 mg total) by mouth daily. 12/28/18 03/28/19 Yes Sagardia, Ines Bloomer, MD  hydrochlorothiazide (MICROZIDE) 12.5 MG capsule Take 12.5 mg by mouth every morning. 06/14/18  Yes [provider]  valsartan (DIOVAN) 320 MG tablet Take 1 tablet (320 mg total) by mouth daily. 11/16/18  Yes Adrian Prows, MD     Current Outpatient Medications  Medication Instructions  . aspirin 81 mg, Daily  . atorvastatin (LIPITOR) 20 mg, Oral, Daily  . diphenhydrAMINE (BENADRYL) 25 mg, Oral, At bedtime PRN  . eplerenone (INSPRA) 25 mg, Oral, Daily  . FLUoxetine (PROZAC) 40 mg, Oral, Daily  . hydrochlorothiazide (MICROZIDE) 12.5 mg, Oral,  Every morning - 10a  . nitroGLYCERIN (NITROSTAT) 0.4 mg, Sublingual, Every 5 min PRN  . valsartan (DIOVAN) 320 mg, Oral, Daily    Cardiac Studies:   Coronary Angiography 04/25/2014: PTCA and stenting of the proximal and mid LAD with implantation of a 3.0 x 38 mm resolute DES and balloon angioplasty of the D1 with 2.5 x 15 mm balloon. Mild disease otherwise. LVEF 50-55%  Exercise sestamibi stress test 07/20/2016: 1. The resting electrocardiogram demonstrated normal sinus rhythm, RBBB and no resting arrhythmias.  The stress electrocardiogram was normal.  Occasional PVC. Patient exercised on Bruce protocol for 7:30 minutes and achieved 9.34 METS. Stress test terminated due  to fatigue and 95% MPHR achieved (Target HR >85%).  2. The LV is dilated both at rest and stress images. The LV end diastolic volume was  [id number]. REST and STRESS images demonstrate decreased tracer uptake in the mid inferoseptal and apical septal segments of the left ventricle.  These defects are related to diaphragmatic attenuation.  The left ventricular ejection fraction was calculated to be 48% bu visually appears to be normal.  This is a low risk study.  Echocardiogram 07/28/2016: Left ventricle cavity is normal in size. Mild concentric hypertrophy of the left ventricle. Normal global wall motion. Normal diastolic filling pattern. Calculated EF 54%. Left atrial cavity is mildly dilated at 4.0 cm. Mild tricuspid regurgitation. Compared to 05/10/2014, no significant change.  Assessment     ICD-10-CM   1. Essential hypertension  I10 EKG 12-Lead    eplerenone (INSPRA) 25 MG tablet    hydrochlorothiazide (MICROZIDE) 12.5 MG capsule    valsartan (DIOVAN) 320 MG tablet  2. Coronary artery disease involving native coronary artery of native heart without angina pectoris  I25.10 nitroGLYCERIN (NITROSTAT) 0.4 MG SL tablet  3. Hyperglycemia  R73.9   4. Hypercholesteremia  E78.00 atorvastatin (LIPITOR) 40 MG tablet    Lipid Panel With LDL/HDL Ratio    LDL cholesterol, direct    EKG 01/04/2019: Normal sinus rhythm with rate of 64 bpm, normal axis, right bundle branch block.  No evidence of ischemia.  No significant change from 12/20/2017.  Recommendations:   Edwin Mckee is here on annual visit and follow-up of coronary artery disease, he remains asymptomatic without any angina pectoris, blood pressure is also well controlled.  I reviewed his lipids, LDL is not at goal, I will prefer to have his LDL to less than or equal to 70 mg.  We will increase atorvastatin to 40 mg  one 20 mg.  All the prescriptions were refilled.  Previously he was orthostatic, today he is not orthostatic.  He has had occasional  episodes or dizziness.  Last year he was complaining of frequent dizziness when he stood up, this is resolved.  He does have hyperglycemia, TSH is mildly elevated, this needs to be kept in mind.  Otherwise he is stable from cardiac standpoint, I'll see him back in one year or sooner if problems.  If LDL does not get controlled, he will need to be added Zetia to the present medical regimen.  Adrian Prows, MD, [facility] 01/04/2019, 10:26 AM [facility] [place] Pager: [phone] Office: [phone] If no answer Cell [phone]

## 2019-01-04 NOTE — Patient Instructions (Signed)
You will need to do labs in 6 weeks.

## 2019-02-16 LAB — LIPID PANEL WITH LDL/HDL RATIO
Cholesterol, Total: 131 mg/dL (ref 100–199)
HDL: 53 mg/dL (ref >39)
LDL Chol Calc (NIH): 66 mg/dL (ref 0–99)
LDL/HDL Ratio: 1.2 ratio (ref 0.0–3.6)
Triglycerides: 52 mg/dL (ref 0–149)
VLDL Cholesterol Cal: 12 mg/dL (ref 5–40)

## 2019-02-16 LAB — LDL CHOLESTEROL, DIRECT: LDL Direct: 69 mg/dL (ref 0–99)

## 2019-02-20 ENCOUNTER — Telehealth: Payer: Self-pay

## 2019-02-20 NOTE — Telephone Encounter (Signed)
Called pt to inform him to continue with the 40 on his atorvastatin

## 2019-02-20 NOTE — Telephone Encounter (Signed)
Continue 40

## 2019-02-20 NOTE — Telephone Encounter (Signed)
Called pt due to a VM about his medication. Gave pt lab results and pt would like to know if he should continue with the 40 or start the 20 again please advise. Thank you

## 2019-02-27 ENCOUNTER — Other Ambulatory Visit: Payer: Self-pay

## 2019-02-27 DIAGNOSIS — E7800 Pure hypercholesterolemia, unspecified: Secondary | ICD-10-CM

## 2019-02-27 MED ORDER — ATORVASTATIN CALCIUM 40 MG PO TABS: 40.0000 mg | ORAL_TABLET | Freq: Every day | ORAL | 3 refills | Status: DC

## 2019-04-12 ENCOUNTER — Ambulatory Visit: Payer: Federal, State, Local not specified - PPO

## 2019-04-21 ENCOUNTER — Ambulatory Visit: Payer: Federal, State, Local not specified - PPO | Attending: Internal Medicine

## 2019-04-21 DIAGNOSIS — Z23 Encounter for immunization: Secondary | ICD-10-CM | POA: Insufficient documentation

## 2019-04-21 NOTE — Progress Notes (Signed)
   [id number]  Name:  Edwin Mckee.    MRN: [id number] DOB: [birth year]  04/21/2019  Edwin Mckee was observed post Covid-19 immunization for 15 minutes without incidence. He was provided with Vaccine Information Sheet and instruction to access the V-Safe system.   Edwin Mckee was instructed to call 911 with any severe reactions post vaccine: Marland Kitchen Difficulty breathing  . Swelling of your face and throat  . A fast heartbeat  . A bad rash all over your body  . Dizziness and weakness    Immunizations Administered    Name Date Dose VIS Date Route   Pfizer COVID-19 Vaccine 04/21/2019 10:55 AM 0.3 mL 02/24/2019 Intramuscular   Manufacturer: [place]   Lot: [id number]   [place]: [id number]

## 2019-05-03 ENCOUNTER — Ambulatory Visit: Payer: Federal, State, Local not specified - PPO

## 2019-05-13 ENCOUNTER — Ambulatory Visit: Payer: Federal, State, Local not specified - PPO

## 2019-05-17 ENCOUNTER — Ambulatory Visit: Payer: Federal, State, Local not specified - PPO | Attending: Internal Medicine

## 2019-05-17 DIAGNOSIS — Z23 Encounter for immunization: Secondary | ICD-10-CM | POA: Insufficient documentation

## 2019-05-17 NOTE — Progress Notes (Signed)
   [id number] Vaccination [facility]  Name:  Ormond Scherer.    MRN: [id number] DOB: [birth year]  05/17/2019  Mr. Ratzlaff was observed post Covid-19 immunization for 15 minutes without incident. He was provided with Vaccine Information Sheet and instruction to access the V-Safe system.   Mr. Harbottle was instructed to call 911 with any severe reactions post vaccine: Marland Kitchen Difficulty breathing  . Swelling of face and throat  . A fast heartbeat  . A bad rash all over body  . Dizziness and weakness   Immunizations Administered    Name Date Dose VIS Date Route   Pfizer COVID-19 Vaccine 05/17/2019  1:28 PM 0.3 mL 02/24/2019 Intramuscular   Manufacturer: [place]   Lot: [id number]   [place]: [id number]

## 2019-12-18 ENCOUNTER — Other Ambulatory Visit: Payer: Self-pay | Admitting: Emergency Medicine

## 2019-12-18 DIAGNOSIS — Z8659 Personal history of other mental and behavioral disorders: Secondary | ICD-10-CM

## 2019-12-18 NOTE — Telephone Encounter (Signed)
Requested medication (s) are due for refill today: yes  Requested medication (s) are on the active medication list: yes  Last refill:  09/21/19  Future visit scheduled: no  Notes to clinic:  no valid encounter within last 6 months    Requested Prescriptions  Pending Prescriptions Disp Refills   FLUoxetine (PROZAC) 40 MG capsule [Pharmacy Med Name: FLUOXETINE HCL 40 MG CAPSULE] 90 capsule 3    Sig: TAKE 1 [place]      Psychiatry:  Antidepressants - SSRI Failed - 12/18/2019  1:20 AM      Failed - Valid encounter within last 6 months    Recent Outpatient Visits           11 months ago Routine general medical examination at a health care facility   [facility] [place], Ali Chukson, MD   1 year ago Non-recurrent unilateral inguinal hernia without obstruction or gangrene   [facility], Arlie Solomons, MD   1 year ago Routine general medical examination at a health care facility   [facility] [place], Ines Bloomer, MD   3 years ago Routine general medical examination at a health care facility   [place] [facility], [place], MD   3 years ago Community acquired pneumonia of right lower lobe of lung [facility])   [facility], Arlie Solomons, MD       Future Appointments             In 3 weeks Adrian Prows, MD [facility]            Passed - Completed PHQ-2 or PHQ-9 in the last 360 days.

## 2019-12-18 NOTE — Telephone Encounter (Signed)
No further refills without office visit 

## 2019-12-18 NOTE — Telephone Encounter (Signed)
Called pt his said his will call us back

## 2019-12-18 NOTE — Telephone Encounter (Signed)
Please schedule patient a f/u appt and med refill. 30 day has been sent

## 2020-01-04 ENCOUNTER — Other Ambulatory Visit: Payer: Self-pay

## 2020-01-04 ENCOUNTER — Encounter: Payer: Self-pay | Admitting: Family Medicine

## 2020-01-04 ENCOUNTER — Ambulatory Visit (INDEPENDENT_AMBULATORY_CARE_PROVIDER_SITE_OTHER): Payer: Federal, State, Local not specified - PPO | Admitting: Family Medicine

## 2020-01-04 VITALS — BP 135/83 | HR 66 | Temp 98.3°F | Ht 70.0 in | Wt 170.6 lb

## 2020-01-04 DIAGNOSIS — Z8659 Personal history of other mental and behavioral disorders: Secondary | ICD-10-CM

## 2020-01-04 DIAGNOSIS — Z1211 Encounter for screening for malignant neoplasm of colon: Secondary | ICD-10-CM | POA: Diagnosis not present

## 2020-01-04 DIAGNOSIS — Z23 Encounter for immunization: Secondary | ICD-10-CM

## 2020-01-04 DIAGNOSIS — Z0001 Encounter for general adult medical examination with abnormal findings: Secondary | ICD-10-CM | POA: Diagnosis not present

## 2020-01-04 DIAGNOSIS — M26609 Unspecified temporomandibular joint disorder, unspecified side: Secondary | ICD-10-CM | POA: Diagnosis not present

## 2020-01-04 DIAGNOSIS — Z0000 Encounter for general adult medical examination without abnormal findings: Secondary | ICD-10-CM

## 2020-01-04 DIAGNOSIS — Z8679 Personal history of other diseases of the circulatory system: Secondary | ICD-10-CM

## 2020-01-04 DIAGNOSIS — Z8639 Personal history of other endocrine, nutritional and metabolic disease: Secondary | ICD-10-CM

## 2020-01-04 MED ORDER — FLUOXETINE HCL 40 MG PO CAPS: 40.0000 mg | ORAL_CAPSULE | Freq: Every day | ORAL | 3 refills | Status: DC

## 2020-01-04 NOTE — Progress Notes (Signed)
Patient ID: Edwin Mckee., male    DOB: [birth year]  Age: 74 y.o. MRN: [id number]  Chief Complaint  Patient presents with  . Annual Exam    Subjective:   74 year old gentleman who is here for an annual physical examination.  He is generally healthy.  He has a history of coronary artery disease and bypass and has been seeing a cardiologist regularly.  He is scheduled to see the cardiologist next week.  He does complain of his left TM joint bothering him.  It locks on him in a fashion he cannot open it well.  We discussed options on that.  Past medical history: Surgeries: Vasectomy, herniorrhaphy, hemorrhoidectomy, and CABG Medical illnesses: Coronary artery disease Medications: See list Allergies: None  Family history: Father died at 65 from Alzheimer's.  Mother died at 77 from pulmonary complications from chemotherapy.  Brother and sister living well.  Social history: Retired from the [employer].  Golfs 3 days a week.  Stays very active.  Attends music [employer].  He is proud of his daughter who is a Geophysicist/field seismologist in [place], and his son who works for [employer] and [place].  Patient is married.  Stays busy around the house.  Review of systems: Constitutional: Unremarkable HEENT: TMJ problems on the left as noted above Cardiovascular: He has been having some left chest pain in the axillary waiting area.  He will discuss this with his cardiologist next week. Respiratory: Unremarkable GI: Unremarkable.  Is due for colonoscopy GU: Unremarkable.  Not sexually active with his wife any longer.  Some hesitancy of urination. Musculoskeletal: Aches and pains but does a lot of stretching and exercising and does okay. Dermatologic: Unremarkable Neurologic: Unremarkable Psychiatric: Chronic history of depression well-controlled on medication.  Over the years he has tried himself off the medicine periodically and has not done well.  At this point he is concluded he just needs  to stay on it for life.   Current allergies, medications, problem list, past/family and social histories reviewed.  Objective:  BP 135/83 (BP Location: Right Arm, Patient Position: Sitting, Cuff Size: Normal)   Pulse 66   Temp 98.3 F (36.8 C) (Temporal)   Ht 5' 10"  (1.778 m)   Wt 170 lb 9.6 oz (77.4 kg)   SpO2 97%   BMI 24.48 kg/m   Well-developed well-nourished lean man in no acute distress.  Wears hearing aids bilaterally.  Throat clear.  Is able to open his mouth but not the full with.  Eyes PERRL.  No carotid bruits.  Chest is clear to auscultation.  Heart regular without murmur.  No chest wall tenderness.  I am soft that mass or tenderness.  Normal male external genitalia with testes descended.  No hernias.  Extremities unremarkable.  No edema.  Skin warm and dry.  Pulses good.  Assessment & Plan:   Assessment: 1. Annual physical exam   2. Screening for colon cancer   3. History of depression   4. History of high cholesterol   5. History of hypertension   6. Need for prophylactic vaccination and inoculation against influenza   7. TMJ dysfunction   8. Need for immunization against influenza       Plan: See instructions.  Flu shot today.  Orders Placed This Encounter  Procedures  . Flu Vaccine QUAD High Dose(Fluad)  . CBC  . CMP14+EGFR  . Lipid panel  . Thyroid Panel With TSH  . PSA  . Ambulatory referral to Gastroenterology  Referral Priority:   Routine    Referral Type:   Consultation    Referral Reason:   Specialty Services Required    Number of Visits Requested:   1    Meds ordered this encounter  Medications  . FLUoxetine (PROZAC) 40 MG capsule    Sig: Take 1 capsule (40 mg total) by mouth daily.    Dispense:  90 capsule    Refill:  3         Patient Instructions    Referral is being made to gastroenterology for colon evaluation  Suggest seeing Dr. Hali Marry for evaluation of your TMJ.  You can tell his perception is, Pi, that I was  the one who suggested you see him and I do not think there would be any problem getting in.  Continue current medications.  Refill was submitted for your antidepressant.  Discuss the chest wall pain with your cardiologist.  Flu shot today.  Return as needed   If you have lab work done today you will be contacted with your lab results within the next 2 weeks.  If you have not heard from [place] then please contact us. The fastest way to get your results is to register for My Chart.   IF you received an x-ray today, you will receive an invoice from [facility]. Please contact [facility] at [phone] with questions or concerns regarding your invoice.   IF you received labwork today, you will receive an invoice from [place]. Please contact [facility] at [phone] with questions or concerns regarding your invoice.   Our billing staff will not be able to assist you with questions regarding bills from these companies.  You will be contacted with the lab results as soon as they are available. The fastest way to get your results is to activate your My Chart account. Instructions are located on the last page of this paperwork. If you have not heard from [place] regarding the results in 2 weeks, please contact this office.        Return in about 1 year (around 01/03/2021), or if symptoms worsen or fail to improve.   Ruben Reason, MD 01/04/2020

## 2020-01-04 NOTE — Patient Instructions (Addendum)
  Referral is being made to gastroenterology for colon evaluation  Suggest seeing Dr. Hali Marry for evaluation of your TMJ.  You can tell his perception is, Pi, that I was the one who suggested you see him and I do not think there would be any problem getting in.  Continue current medications.  Refill was submitted for your antidepressant.  Discuss the chest wall pain with your cardiologist.  Flu shot today.  Return as needed   If you have lab work done today you will be contacted with your lab results within the next 2 weeks.  If you have not heard from [place] then please contact us. The fastest way to get your results is to register for My Chart.   IF you received an x-ray today, you will receive an invoice from [facility]. Please contact [facility] at [phone] with questions or concerns regarding your invoice.   IF you received labwork today, you will receive an invoice from [place]. Please contact [facility] at [phone] with questions or concerns regarding your invoice.   Our billing staff will not be able to assist you with questions regarding bills from these companies.  You will be contacted with the lab results as soon as they are available. The fastest way to get your results is to activate your My Chart account. Instructions are located on the last page of this paperwork. If you have not heard from [place] regarding the results in 2 weeks, please contact this office.

## 2020-01-05 LAB — CMP14+EGFR
ALT: 24 IU/L (ref 0–44)
AST: 29 IU/L (ref 0–40)
Albumin/Globulin Ratio: 2.3 — ABNORMAL HIGH (ref 1.2–2.2)
Albumin: 4.5 g/dL (ref 3.7–4.7)
Alkaline Phosphatase: 65 IU/L (ref 44–121)
BUN/Creatinine Ratio: 12 (ref 10–24)
BUN: 13 mg/dL (ref 8–27)
Bilirubin Total: 0.5 mg/dL (ref 0.0–1.2)
CO2: 23 mmol/L (ref 20–29)
Calcium: 9.4 mg/dL (ref 8.6–10.2)
Chloride: 106 mmol/L (ref 96–106)
Creatinine, Ser: 1.1 mg/dL (ref 0.76–1.27)
GFR calc Af Amer: 78 mL/min/{1.73_m2} (ref >59)
GFR calc non Af Amer: 67 mL/min/{1.73_m2} (ref >59)
Globulin, Total: 2 g/dL (ref 1.5–4.5)
Glucose: 88 mg/dL (ref 65–99)
Potassium: 4.1 mmol/L (ref 3.5–5.2)
Sodium: 143 mmol/L (ref 134–144)
Total Protein: 6.5 g/dL (ref 6.0–8.5)

## 2020-01-05 LAB — CBC
Hematocrit: 39.3 % (ref 37.5–51.0)
Hemoglobin: 13.2 g/dL (ref 13.0–17.7)
MCH: 29.7 pg (ref 26.6–33.0)
MCHC: 33.6 g/dL (ref 31.5–35.7)
MCV: 89 fL (ref 79–97)
Platelets: 236 10*3/uL (ref 150–450)
RBC: 4.44 x10E6/uL (ref 4.14–5.80)
RDW: 13.1 % (ref 11.6–15.4)
WBC: 6.2 10*3/uL (ref 3.4–10.8)

## 2020-01-05 LAB — THYROID PANEL WITH TSH
Free Thyroxine Index: 1.4 (ref 1.2–4.9)
T3 Uptake Ratio: 21 % — ABNORMAL LOW (ref 24–39)
T4, Total: 6.6 ug/dL (ref 4.5–12.0)
TSH: 3.13 u[IU]/mL (ref 0.450–4.500)

## 2020-01-05 LAB — PSA: Prostate Specific Ag, Serum: 0.7 ng/mL (ref 0.0–4.0)

## 2020-01-05 LAB — LIPID PANEL
Chol/HDL Ratio: 2.4 ratio (ref 0.0–5.0)
Cholesterol, Total: 131 mg/dL (ref 100–199)
HDL: 54 mg/dL (ref >39)
LDL Chol Calc (NIH): 64 mg/dL (ref 0–99)
Triglycerides: 61 mg/dL (ref 0–149)
VLDL Cholesterol Cal: 13 mg/dL (ref 5–40)

## 2020-01-08 ENCOUNTER — Ambulatory Visit: Payer: Federal, State, Local not specified - PPO | Admitting: Cardiology

## 2020-01-12 ENCOUNTER — Ambulatory Visit: Payer: Federal, State, Local not specified - PPO | Admitting: Cardiology

## 2020-01-12 ENCOUNTER — Encounter: Payer: Self-pay | Admitting: Cardiology

## 2020-01-12 ENCOUNTER — Other Ambulatory Visit: Payer: Self-pay

## 2020-01-12 VITALS — BP 133/78 | HR 63 | Resp 16 | Ht 70.0 in | Wt 172.0 lb

## 2020-01-12 DIAGNOSIS — I10 Essential (primary) hypertension: Secondary | ICD-10-CM

## 2020-01-12 DIAGNOSIS — I951 Orthostatic hypotension: Secondary | ICD-10-CM

## 2020-01-12 DIAGNOSIS — E7800 Pure hypercholesterolemia, unspecified: Secondary | ICD-10-CM

## 2020-01-12 DIAGNOSIS — I2510 Atherosclerotic heart disease of native coronary artery without angina pectoris: Secondary | ICD-10-CM

## 2020-01-12 MED ORDER — EPLERENONE 25 MG PO TABS: 25.0000 mg | ORAL_TABLET | Freq: Every day | ORAL | 3 refills | Status: DC

## 2020-01-12 MED ORDER — VALSARTAN 320 MG PO TABS: 320.0000 mg | ORAL_TABLET | Freq: Every day | ORAL | 3 refills | Status: DC

## 2020-01-12 MED ORDER — HYDROCHLOROTHIAZIDE 12.5 MG PO CAPS: 12.5000 mg | ORAL_CAPSULE | Freq: Every morning | ORAL | 3 refills | Status: DC

## 2020-01-12 MED ORDER — ATORVASTATIN CALCIUM 40 MG PO TABS: 40.0000 mg | ORAL_TABLET | Freq: Every day | ORAL | 3 refills | Status: DC

## 2020-01-12 NOTE — Progress Notes (Signed)
Primary Physician/Referring:  Horald Pollen, MD  Patient ID: Edwin Sevin., male    DOB: 1945/06/07, 74 y.o.   MRN: [id number]  No chief complaint on file.  HPI:    Edwin Mckee.  is a 74 y.o. Caucasian male with coronary artery disease with history of non-STEMI 04/25/2014 S/P stenting of the proximal and mid LAD with implantation of a 3.0 x 17mm resolute DES and balloon angioplasty of the D1 with 2.5 x 15 mm balloon. Otherwise diffuse mild disease in other vessels, LVEF 50-55%. He also has hypertension and orthostatic hypotension and mild hyperlipidemia.   He presents for one year office visit.  He is presently doing well without recurrence of dizziness.  Blood pressure is well controlled.  He continues to be active playing golf and walking the course 2-3 times weekly.  He does report occasional left-sided chest tightness at rest that lasts a few seconds and is relieved spontaneously.  Denies shortness of breath, PND, orthopnea, leg swelling.  Past Medical History:  Diagnosis Date  . Arthritis   . Depression   . Depression    Phreesia 01/03/2020  . Hypertension   . NSTEMI (non-ST elevated myocardial infarction) ([place]) 04/25/2014  . Substance abuse [facility])    Past Surgical History:  Procedure Laterality Date  . HERNIA REPAIR    . LEFT HEART CATHETERIZATION WITH CORONARY ANGIOGRAM N/A 04/25/2014   Procedure: LEFT HEART CATHETERIZATION WITH CORONARY ANGIOGRAM;  Surgeon: Laverda Page, MD;  Location: [facility] CATH LAB;  Service: Cardiovascular;  Laterality: N/A;  . VASECTOMY     Social History   Tobacco Use  . Smoking status: Former Smoker    Packs/day: 0.50    Years: 10.00    Pack years: 5.00    Quit date: 04/12/1982    Years since quitting: 37.7  . Smokeless tobacco: Never Used  Substance Use Topics  . Alcohol use: No    Alcohol/week: 0.0 standard drinks  Marital Status: Married   ROS  Review of Systems  Constitutional: Negative for chills, decreased  appetite, malaise/fatigue and weight gain.  HENT: Positive for hearing loss.   Cardiovascular: Positive for chest pain. Negative for claudication, dyspnea on exertion, leg swelling, near-syncope, orthopnea, palpitations, paroxysmal nocturnal dyspnea and syncope.  Respiratory: Negative for shortness of breath.   Endocrine: Negative for cold intolerance.  Hematologic/Lymphatic: Does not bruise/bleed easily.  Musculoskeletal: Positive for joint pain. Negative for joint swelling.  Gastrointestinal: Negative for abdominal pain, anorexia, change in bowel habit, hematochezia and melena.  Neurological: Negative for dizziness, headaches, light-headedness and weakness.  Psychiatric/Behavioral: Negative for depression and substance abuse.  All other systems reviewed and are negative.  Objective   Vitals with BMI 01/04/2020 01/04/2019 01/04/2019  Height 5\' 10"  5\' 10"  5\' 10"   Weight 170 lbs 10 oz 171 lbs 171 lbs  BMI 24.48 18.56 31.49  Systolic 702 637 858  Diastolic 83 87 85  Pulse 66 80 67     Physical Exam Vitals reviewed.  HENT:     Head: Normocephalic and atraumatic.  Eyes:     Conjunctiva/sclera: Conjunctivae normal.  Neck:     Thyroid: No thyromegaly.     Vascular: No JVD.  Cardiovascular:     Rate and Rhythm: Normal rate and regular rhythm.     Pulses: Intact distal pulses.          Carotid pulses are 2+ on the right side and 2+ on the left side.      Radial  pulses are 2+ on the right side and 2+ on the left side.       Popliteal pulses are 2+ on the right side and 2+ on the left side.       Dorsalis pedis pulses are 1+ on the right side and 1+ on the left side.       Posterior tibial pulses are 1+ on the right side and 1+ on the left side.     Heart sounds: Normal heart sounds, S1 normal and S2 normal. No murmur heard.  No gallop.      Comments: No leg edema, no JVD. Pulmonary:     Effort: Pulmonary effort is normal. No respiratory distress.     Breath sounds: Normal breath  sounds. No wheezing, rhonchi or rales.  Abdominal:     General: Bowel sounds are normal.     Palpations: Abdomen is soft.  Musculoskeletal:        General: Normal range of motion.     Cervical back: Neck supple.     Right lower leg: No edema.     Left lower leg: No edema.  Skin:    General: Skin is warm and dry.  Neurological:     Mental Status: He is alert.    Laboratory examination:   Recent Labs    01/04/20 1641  NA 143  K 4.1  CL 106  CO2 23  GLUCOSE 88  BUN 13  CREATININE 1.10  CALCIUM 9.4  GFRNONAA 67  GFRAA 78   CMP Latest Ref Rng & Units 01/04/2020 12/28/2018 04/05/2018  Glucose 65 - 99 mg/dL 88 106(H) 99  BUN 8 - 27 mg/dL 13 16 13   Creatinine 0.76 - 1.27 mg/dL 1.10 1.06 1.01  Sodium 134 - 144 mmol/L 143 138 141  Potassium 3.5 - 5.2 mmol/L 4.1 4.7 4.5  Chloride 96 - 106 mmol/L 106 99 101  CO2 20 - 29 mmol/L 23 23 24   Calcium 8.6 - 10.2 mg/dL 9.4 9.9 9.7  Total Protein 6.0 - 8.5 g/dL 6.5 7.5 6.6  Total Bilirubin 0.0 - 1.2 mg/dL 0.5 0.5 0.3  Alkaline Phos 44 - 121 IU/L 65 86 54  AST 0 - 40 IU/L 29 38 28  ALT 0 - 44 IU/L 24 26 27    CBC Latest Ref Rng & Units 01/04/2020 04/05/2018 12/16/2016  WBC 3.4 - 10.8 x10E3/uL 6.2 7.2 6.3  Hemoglobin 13.0 - 17.7 g/dL 13.2 13.5 13.5  Hematocrit 37.5 - 51.0 % 39.3 39.8 42.1  Platelets 150 - 450 x10E3/uL 236 230 282   Lipid Panel Recent Labs    02/15/19 1042 01/04/20 1641  CHOL 131 131  TRIG 52 61  LDLCALC 66 64  HDL 53 54  CHOLHDL  --  2.4  LDLDIRECT 69  --     HEMOGLOBIN A1C Lab Results  Component Value Date   HGBA1C 5.6 12/22/2017   TSH Recent Labs    01/04/20 1641  TSH 3.130   Medications and allergies  No Known Allergies  Current Outpatient Medications on File Prior to Visit  Medication Sig Dispense Refill  . aspirin 81 MG tablet Take 81 mg by mouth daily.    Marland Kitchen atorvastatin (LIPITOR) 40 MG tablet Take 1 tablet (40 mg total) by mouth daily. 90 tablet 3  . diphenhydrAMINE (BENADRYL) 25 MG tablet  Take 25 mg by mouth at bedtime as needed.    Marland Kitchen eplerenone (INSPRA) 25 MG tablet Take 1 tablet (25 mg total) by mouth daily. 90 tablet  3  . FLUoxetine (PROZAC) 40 MG capsule Take 1 capsule (40 mg total) by mouth daily. 90 capsule 3  . hydrochlorothiazide (MICROZIDE) 12.5 MG capsule Take 1 capsule (12.5 mg total) by mouth every morning. 90 capsule 3  . nitroGLYCERIN (NITROSTAT) 0.4 MG SL tablet Place 1 tablet (0.4 mg total) under the tongue every 5 (five) minutes as needed for chest pain. 25 tablet 1  . valsartan (DIOVAN) 320 MG tablet Take 1 tablet (320 mg total) by mouth daily. 90 tablet 3   No current facility-administered medications on file prior to visit.     Radiology:  No results found.  Cardiac Studies:   Coronary Angiography 04/25/2014: PTCA and stenting of the proximal and mid LAD with implantation of a 3.0 x 38 mm resolute DES and balloon angioplasty of the D1 with 2.5 x 15 mm balloon. Mild disease otherwise. LVEF 50-55%  Exercise sestamibi stress test 07/20/2016: 1. The resting electrocardiogram demonstrated normal sinus rhythm, RBBB and no resting arrhythmias.  The stress electrocardiogram was normal.  Occasional PVC. Patient exercised on Bruce protocol for 7:30 minutes and achieved 9.34 METS. Stress test terminated due to fatigue and 95% MPHR achieved (Target HR >85%).  2. The LV is dilated both at rest and stress images. The LV end diastolic volume was  833AS. REST and STRESS images demonstrate decreased tracer uptake in the mid inferoseptal and apical septal segments of the left ventricle.  These defects are related to diaphragmatic attenuation.  The left ventricular ejection fraction was calculated to be 48% bu visually appears to be normal.  This is a low risk study.  Echocardiogram 07/28/2016: Left ventricle cavity is normal in size. Mild concentric hypertrophy of the left ventricle. Normal global wall motion. Normal diastolic filling pattern. Calculated EF 54%. Left atrial  cavity is mildly dilated at 4.0 cm. Mild tricuspid regurgitation. Compared to 05/10/2014, no significant change.  EKG:    EKG 01/12/2020: Sinus rhythm at a rate of 64 bpm, With borderline first-degree AV block, left atrial abnormality. Normal axis. Right bundle branch block. Compared to EKG 01/04/2019, no significant change.  Assessment     ICD-10-CM   1. Coronary artery disease involving native coronary artery of native heart without angina pectoris  I25.10   2. Essential hypertension  I10   3. Hypercholesteremia  E78.00   4. Orthostatic hypotension  I95.1   No orders of the defined types were placed in this encounter. There are no discontinued medications.  Recommendations:   Edwin Mckee.  is a 74 y.o. Caucasian male with coronary artery disease with history of non-STEMI 04/25/2014 S/P stenting of the proximal and mid LAD with implantation of a 3.0 x 64mm resolute DES and balloon angioplasty of the D1 with 2.5 x 15 mm balloon. Otherwise diffuse mild disease in other vessels, LVEF 50-55%. He also has hypertension and orthostatic hypotension and mild hyperlipidemia.   Patient presents for annual visit and follow-up of coronary artery disease, hypertension, hypercholesterolemia.  He remains asymptomatic without angina pectoris.  Blood pressure is well controlled and he is no longer having episodes of dizziness suggestive of orthostasis.  Will continue aspirin, eplerenone, hydrochlorothiazide, valsartan. S/L NTG was prescribed and explained how to and when to use it and to notify us if there is change in frequency of use. Interaction with cialis-like agents was discussed.  Personally reviewed external labs, lipids are under good control.  Will continue atorvastatin 40 mg.  His TSH has improved and is now within normal range.  Also his glucose is now well controlled.  Encourage patient to continue healthy diet and lifestyle.  Refilled all cardiac medication prescriptions.  Follow-up in  1 year for coronary artery disease, hypertension, hyperlipidemia.    Alethia Berthold, PA-C 01/12/2020, 9:29 AM Office: [phone]

## 2020-01-15 NOTE — Addendum Note (Signed)
Addended by: Carrine Kroboth H on: 01/15/2020 10:19 AM   Modules accepted: Level of Service

## 2020-01-22 ENCOUNTER — Ambulatory Visit: Payer: Federal, State, Local not specified - PPO | Admitting: Cardiology

## 2020-01-31 ENCOUNTER — Other Ambulatory Visit: Payer: Self-pay

## 2020-01-31 ENCOUNTER — Ambulatory Visit (INDEPENDENT_AMBULATORY_CARE_PROVIDER_SITE_OTHER): Payer: Federal, State, Local not specified - PPO | Admitting: Podiatry

## 2020-01-31 ENCOUNTER — Other Ambulatory Visit: Payer: Self-pay | Admitting: Podiatry

## 2020-01-31 ENCOUNTER — Encounter: Payer: Self-pay | Admitting: Podiatry

## 2020-01-31 ENCOUNTER — Ambulatory Visit (INDEPENDENT_AMBULATORY_CARE_PROVIDER_SITE_OTHER): Payer: Federal, State, Local not specified - PPO

## 2020-01-31 ENCOUNTER — Ambulatory Visit: Payer: Federal, State, Local not specified - PPO | Admitting: Cardiology

## 2020-01-31 DIAGNOSIS — D3613 Benign neoplasm of peripheral nerves and autonomic nervous system of lower limb, including hip: Secondary | ICD-10-CM | POA: Diagnosis not present

## 2020-01-31 DIAGNOSIS — M79671 Pain in right foot: Secondary | ICD-10-CM

## 2020-01-31 DIAGNOSIS — D3610 Benign neoplasm of peripheral nerves and autonomic nervous system, unspecified: Secondary | ICD-10-CM | POA: Diagnosis not present

## 2020-01-31 NOTE — Patient Instructions (Signed)

## 2020-01-31 NOTE — Progress Notes (Signed)
Subjective:   Patient ID: Edwin Sevin., male   DOB: 74 y.o.   MRN: [id number]   HPI Patient presents stating with golf that he gets shooting pains between the third and fourth toes and second and third toes.  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  At this point I did do a sterile prep and I injected the third interspace 3 mg Kenalog 5 mg Xylocaine and I discussed possible excision of the nerve in the future.  I reviewed what would be required and explained this to him and he will think about this and we will see the response to injection first before we consider anything else  X-rays were negative for signs of fracture or bony pathology

## 2020-02-13 ENCOUNTER — Encounter: Payer: Self-pay | Admitting: Internal Medicine

## 2020-02-29 ENCOUNTER — Other Ambulatory Visit: Payer: Self-pay | Admitting: Cardiology

## 2020-02-29 DIAGNOSIS — I2510 Atherosclerotic heart disease of native coronary artery without angina pectoris: Secondary | ICD-10-CM

## 2020-02-29 DIAGNOSIS — E7800 Pure hypercholesterolemia, unspecified: Secondary | ICD-10-CM

## 2020-03-21 ENCOUNTER — Other Ambulatory Visit: Payer: Self-pay

## 2020-03-21 ENCOUNTER — Ambulatory Visit (AMBULATORY_SURGERY_CENTER): Payer: Self-pay | Admitting: *Deleted

## 2020-03-21 VITALS — Ht 70.0 in | Wt 170.0 lb

## 2020-03-21 DIAGNOSIS — Z86010 Personal history of colonic polyps: Secondary | ICD-10-CM

## 2020-03-21 MED ORDER — SUPREP BOWEL PREP KIT 17.5-3.13-1.6 GM/177ML PO SOLN: 1.0000 | Freq: Once | ORAL | 0 refills | Status: AC

## 2020-03-21 NOTE — Progress Notes (Signed)
No egg or soy allergy known to patient  °No issues with past sedation with any surgeries or procedures °No intubation problems in the past  °No FH of Malignant Hyperthermia °No diet pills per patient °No home 02 use per patient  °No blood thinners per patient  °Pt denies issues with constipation  °No A fib or A flutter  °EMMI video to pt or via MyChart  °COVID 19 guidelines implemented in PV today with Pt and RN  °Pt is fully vaccinated  for Covid  ° °Suprep Coupon given to pt in PV today , Code to Pharmacy  ° °Due to the COVID-19 pandemic we are asking patients to follow certain guidelines.  °Pt aware of COVID protocols and [facility] guidelines  ° °

## 2020-04-04 ENCOUNTER — Encounter: Payer: Federal, State, Local not specified - PPO | Admitting: Internal Medicine

## 2020-04-26 ENCOUNTER — Other Ambulatory Visit (HOSPITAL_BASED_OUTPATIENT_CLINIC_OR_DEPARTMENT_OTHER): Payer: Self-pay

## 2020-04-26 DIAGNOSIS — R0683 Snoring: Secondary | ICD-10-CM

## 2020-04-26 DIAGNOSIS — G4710 Hypersomnia, unspecified: Secondary | ICD-10-CM

## 2020-04-26 DIAGNOSIS — R5383 Other fatigue: Secondary | ICD-10-CM

## 2020-04-26 DIAGNOSIS — R0681 Apnea, not elsewhere classified: Secondary | ICD-10-CM

## 2020-05-22 ENCOUNTER — Encounter: Payer: Self-pay | Admitting: Internal Medicine

## 2020-05-22 ENCOUNTER — Other Ambulatory Visit: Payer: Self-pay

## 2020-05-22 ENCOUNTER — Ambulatory Visit (AMBULATORY_SURGERY_CENTER): Payer: Federal, State, Local not specified - PPO | Admitting: Internal Medicine

## 2020-05-22 VITALS — BP 100/61 | HR 50 | Temp 97.4°F | Resp 14 | Ht 70.0 in | Wt 170.0 lb

## 2020-05-22 DIAGNOSIS — Z86010 Personal history of colonic polyps: Secondary | ICD-10-CM | POA: Diagnosis present

## 2020-05-22 DIAGNOSIS — D120 Benign neoplasm of cecum: Secondary | ICD-10-CM

## 2020-05-22 MED ORDER — SODIUM CHLORIDE 0.9 % IV SOLN: 500.0000 mL | Freq: Once | INTRAVENOUS | Status: DC

## 2020-05-22 NOTE — Progress Notes (Signed)
Pt's states no medical or surgical changes since previsit or office visit.   VS taken by CW 

## 2020-05-22 NOTE — Progress Notes (Signed)
PT taken to PACU. Monitors in place. VSS. Report given to RN. 

## 2020-05-22 NOTE — Op Note (Signed)
[place] Patient Name: Edwin Mckee Procedure Date: 05/22/2020 7:31 AM MRN: [id number] Endoscopist: Docia Chuck. Henrene Pastor , MD Age: 76 Referring MD:  Date of Birth: [birth year] Gender: Male Account #: [id number] Procedure:                Colonoscopy with cold snare polypectomy x 1 Indications:              High risk colon cancer surveillance: Personal                            history of non-advanced adenoma (2009) Medicines:                Monitored Anesthesia Care Procedure:                Pre-Anesthesia Assessment:                           - Prior to the procedure, a History and Physical                            was performed, and patient medications and                            allergies were reviewed. The patient's tolerance of                            previous anesthesia was also reviewed. The risks                            and benefits of the procedure and the sedation                            options and risks were discussed with the patient.                            All questions were answered, and informed consent                            was obtained. Prior Anticoagulants: The patient has                            taken no previous anticoagulant or antiplatelet                            agents. ASA Grade Assessment: II - A patient with                            mild systemic disease. After reviewing the risks                            and benefits, the patient was deemed in                            satisfactory condition to undergo the procedure.  After obtaining informed consent, the colonoscope                            was passed under direct vision. Throughout the                            procedure, the patient's blood pressure, pulse, and                            oxygen saturations were monitored continuously. The                            Olympus [id number] was introduced                             through the anus and advanced to the the cecum,                            identified by appendiceal orifice and ileocecal                            valve. The ileocecal valve, appendiceal orifice,                            and rectum were photographed. The quality of the                            bowel preparation was excellent. The colonoscopy                            was performed without difficulty. The patient                            tolerated the procedure well. The bowel preparation                            used was SUPREP via split dose instruction. Scope In: 1:88:41 AM Scope Out: 8:52:25 AM Scope Withdrawal Time: 0 hours 12 minutes 23 seconds  Total Procedure Duration: 0 hours 16 minutes 7 seconds  Findings:                 A 1 mm polyp was found in the cecum. The polyp was                            removed with a cold snare. Resection and retrieval                            were complete.                           Many small and large-mouthed diverticula were found                            in the entire colon.  Internal hemorrhoids were found during retroflexion.                           The exam was otherwise without abnormality on                            direct and retroflexion views. Complications:            No immediate complications. Estimated blood loss:                            None. Estimated Blood Loss:     Estimated blood loss: none. Impression:               - One 1 mm polyp in the cecum, removed with a cold                            snare. Resected and retrieved.                           - Diverticulosis in the entire examined colon.                           - Internal hemorrhoids.                           - The examination was otherwise normal on direct                            and retroflexion views. Recommendation:           - Repeat colonoscopy in 7-10 years for surveillance.                           -  Patient has a contact number available for                            emergencies. The signs and symptoms of potential                            delayed complications were discussed with the                            patient. Return to normal activities tomorrow.                            Written discharge instructions were provided to the                            patient.                           - Resume previous diet.                           - Continue present medications.                           -  Await pathology results. Docia Chuck. Henrene Pastor, MD 05/22/2020 9:03:29 AM This report has been signed electronically.

## 2020-05-22 NOTE — Progress Notes (Signed)
Called to room to assist during endoscopic procedure.  Patient ID and intended procedure confirmed with present staff. Received instructions for my participation in the procedure from the performing physician.  

## 2020-05-22 NOTE — Patient Instructions (Signed)
Handouts on polyps and diverticulosis given to you today  °Await pathology results  ° °YOU HAD AN ENDOSCOPIC PROCEDURE TODAY AT THE [facility]:   Refer to the procedure report that was given to you for any specific questions about what was found during the examination.  If the procedure report does not answer your questions, please call your gastroenterologist to clarify.  If you requested that your care partner not be given the details of your procedure findings, then the procedure report has been included in a sealed envelope for you to review at your convenience later. ° °YOU SHOULD EXPECT: Some feelings of bloating in the abdomen. Passage of more gas than usual.  Walking can help get rid of the air that was put into your GI tract during the procedure and reduce the bloating. If you had a lower endoscopy (such as a colonoscopy or flexible sigmoidoscopy) you may notice spotting of blood in your stool or on the toilet paper. If you underwent a bowel prep for your procedure, you may not have a normal bowel movement for a few days. ° °Please Note:  You might notice some irritation and congestion in your nose or some drainage.  This is from the oxygen used during your procedure.  There is no need for concern and it should clear up in a day or so. ° °SYMPTOMS TO REPORT IMMEDIATELY: ° °Following lower endoscopy (colonoscopy or flexible sigmoidoscopy): ° Excessive amounts of blood in the stool ° Significant tenderness or worsening of abdominal pains ° Swelling of the abdomen that is new, acute ° Fever of 100°F or higher ° °For urgent or emergent issues, a gastroenterologist can be reached at any hour by calling [phone]. °Do not use MyChart messaging for urgent concerns.  ° ° °DIET:  We do recommend a small meal at first, but then you may proceed to your regular diet.  Drink plenty of fluids but you should avoid alcoholic beverages for 24 hours. ° °ACTIVITY:  You should plan to take it easy for the  rest of today and you should NOT DRIVE or use heavy machinery until tomorrow (because of the sedation medicines used during the test).   ° °FOLLOW UP: °Our staff will call the number listed on your records 48-72 hours following your procedure to check on you and address any questions or concerns that you may have regarding the information given to you following your procedure. If we do not reach you, we will leave a message.  We will attempt to reach you two times.  During this call, we will ask if you have developed any symptoms of COVID 19. If you develop any symptoms (ie: fever, flu-like symptoms, shortness of breath, cough etc.) before then, please call [phone].  If you test positive for Covid 19 in the 2 weeks post procedure, please call and report this information to us.   ° °If any biopsies were taken you will be contacted by phone or by letter within the next 1-3 weeks.  Please call us at [phone] if you have not heard about the biopsies in 3 weeks.  ° ° °SIGNATURES/CONFIDENTIALITY: °You and/or your care partner have signed paperwork which will be entered into your electronic medical record.  These signatures attest to the fact that that the information above on your After Visit Summary has been reviewed and is understood.  Full responsibility of the confidentiality of this discharge information lies with you and/or your care-partner.  °

## 2020-05-24 ENCOUNTER — Telehealth: Payer: Self-pay

## 2020-05-24 NOTE — Telephone Encounter (Signed)
First attempt left VM.

## 2020-05-24 NOTE — Telephone Encounter (Signed)
No answer, left message to call if having any issues or concerns, B.Ardith Test RN 

## 2020-05-27 ENCOUNTER — Ambulatory Visit (HOSPITAL_BASED_OUTPATIENT_CLINIC_OR_DEPARTMENT_OTHER): Payer: Federal, State, Local not specified - PPO | Attending: Internal Medicine | Admitting: Internal Medicine

## 2020-05-27 ENCOUNTER — Other Ambulatory Visit: Payer: Self-pay

## 2020-05-27 DIAGNOSIS — R0681 Apnea, not elsewhere classified: Secondary | ICD-10-CM | POA: Insufficient documentation

## 2020-05-27 DIAGNOSIS — R0683 Snoring: Secondary | ICD-10-CM | POA: Diagnosis not present

## 2020-05-27 DIAGNOSIS — G4710 Hypersomnia, unspecified: Secondary | ICD-10-CM

## 2020-05-27 DIAGNOSIS — R5383 Other fatigue: Secondary | ICD-10-CM

## 2020-05-27 DIAGNOSIS — G4752 REM sleep behavior disorder: Secondary | ICD-10-CM | POA: Diagnosis present

## 2020-05-28 ENCOUNTER — Encounter: Payer: Self-pay | Admitting: Internal Medicine

## 2020-06-01 NOTE — Procedures (Signed)
   NAME: Edwin Mckee. DATE OF BIRTH:  14-Jun-1945 MEDICAL RECORD NUMBER [id number]  LOCATION: [facility]  PHYSICIAN: Marius Ditch  DATE OF STUDY: 05/27/2020  SLEEP STUDY TYPE: Nocturnal Polysomnogram               REFERRING PHYSICIAN: Marius Ditch, MD/Jennifer Percell Miller, PA-C  EPWORTH SLEEPINESS SCORE:  3 HEIGHT: 5\' 10"  (177.8 cm)  WEIGHT: 165 lb (74.8 kg)    Body mass index is 23.68 kg/m.  NECK SIZE: 16 in.  CLINICAL INFORMATION The patient was referred to the sleep center for evaluation possible dream enactment.   MEDICATIONS Sleep medicine administered: Benadryl  SLEEP STUDY TECHNIQUE A multi-channel overnight Polysomnography study was performed. The channels recorded and monitored were central and occipital EEG, electrooculogram (EOG), submentalis EMG (chin), nasal and oral airflow, thoracic and abdominal wall motion, anterior tibialis EMG, snore microphone, electrocardiogram, and a pulse oximetry.  TECHNICAL COMMENTS Comments added by Technician: None Comments added by Scorer: N/A  SLEEP ARCHITECTURE The study was initiated at 10:10:22 PM and terminated at 4:31:39 AM. The total recorded time was 381.3 minutes. EEG confirmed total sleep time was 226.8 minutes yielding a sleep efficiency of 59.5%%. Sleep onset after lights out was 99.5 minutes with a REM latency of 188.5 minutes. The patient spent 8.2%% of the night in stage N1 sleep, 60.8%% in stage N2 sleep, 0.0%% in stage N3 and 31.1% in REM. Wake after sleep onset (WASO) was 55.0 minutes. The Arousal Index was 15.6/hour. Normal chin atonia was noted in REM sleep.   RESPIRATORY PARAMETERS There were a total of 16 respiratory disturbances out of which 0 were apneas (0 obstructive, 0 mixed, 0 central) and 16 hypopneas. The apnea/hypopnea index (AHI) was 4.2 events/hour. The central sleep apnea index was 0 events/hour. The REM AHI was 13.6 events/hour and NREM AHI was 0.0 events/hour. The supine  AHI was 13.0 events/hour and the non supine AHI was 0 events/hour. Respiratory disturbances were associated with oxygen desaturation down to a nadir of 89.0% during sleep. The mean oxygen saturation during the study was 95.8%. The cumulative time under 88% oxygen saturation was 5.5 minutes.  LEG MOVEMENT DATA The total leg movements were 313 with a resulting leg movement index of 82.8/hr . Associated arousal with leg movement index was 10.1/hr.  CARDIAC DATA The underlying cardiac rhythm was most consistent with sinus rhythm. Mean heart rate during sleep was 50.5 bpm. Additional rhythm abnormalities include None.  IMPRESSIONS - No Significant Obstructive Sleep Apnea (OSA) - Normal chin atonia - Significant periodic leg movements(PLMs) during sleep. Arousal index associated with leg movements of 10.1 /hour.  DIAGNOSIS - Periodic Limb Movement During Sleep (G47.61) - This study is not supportive of REM Sleep Behavior Disorder. Other causes of dream enactment, including meds, should be considered  RECOMMENDATIONS - PLMs could be treated if clinically necessary.    Marius Ditch Sleep specialist, [place] [employer]  ELECTRONICALLY SIGNED ON:  06/01/2020, 9:53 PM [place] PH: [phone]   FX: [phone] [place]

## 2020-08-26 NOTE — Progress Notes (Signed)
Primary Physician/Referring:  Horald Pollen, MD  Patient ID: Edwin Mckee., male    DOB: [birth year], 75 y.o.   MRN: [id number]  Chief Complaint  Patient presents with   Loss of Consciousness   Follow-up   Shortness of Breath   HPI:    Edwin Mckee.  is a 75 y.o. Caucasian male with coronary artery disease with history of non-STEMI  04/25/2014 S/P stenting of the proximal and mid LAD with implantation of a 3.0 x 79mm resolute DES and balloon angioplasty of the D1 with 2.5 x 15 mm balloon. Otherwise diffuse mild disease in other vessels, LVEF 50-55%.  He also has hypertension and orthostatic hypotension and mild hyperlipidemia.  Patient was last seen in our office 01/12/2020 for annual follow-up, at this time patient was stable and recommended for follow-up in our office in 1 year.  However he now presents for urgent visit with concerns of syncope. Patient reports that 2 weeks ago he got up from his recliner and walked to the bathroom. When he got to the bathroom he felt dizzy and was "seeing spots", he therefore leaned against the bathroom counter and reportedly lost consciousness. Patient's wife and daughter where home and there to help him up when he came to. Patient denies post-ictal state. Denies loss of bowel or bladder continence.   Upon further questioning patient reports he has also had occasional episodes of dizziness with positional changes lasting <1 minute. Patient otherwise remains active. Denies chest pain, palpitations, dyspnea, orthopnea, PND, leg swelling.   Past Medical History:  Diagnosis Date   Arthritis    Depression    Depression    [employer]reesia 01/03/2020   Hyperlipidemia    controlled with meds    Hypertension    NSTEMI (non-ST elevated myocardial infarction) ([place]) 04/25/2014   Substance abuse ([place])    Past Surgical History:  Procedure Laterality Date   COLONOSCOPY     HERNIA REPAIR     LEFT HEART CATHETERIZATION WITH CORONARY ANGIOGRAM N/A  04/25/2014   Procedure: LEFT HEART CATHETERIZATION WITH CORONARY ANGIOGRAM;  Surgeon: Laverda Page, MD;  Location: [facility] CATH LAB;  Service: Cardiovascular;  Laterality: N/A; with stents placement    POLYPECTOMY     VASECTOMY     Family History  Problem Relation Age of Onset   Cancer Mother        lung   Hypertension Father    Alzheimer's disease Father    Heart disease Sister    Heart disease Brother    Stroke Maternal Grandmother    Alcohol abuse Maternal Grandfather    Cancer Maternal Grandfather    Stroke Paternal Grandmother    Cancer Paternal Grandfather    Colon cancer Neg Hx    Colon polyps Neg Hx    Esophageal cancer Neg Hx    Rectal cancer Neg Hx    Stomach cancer Neg Hx     Social History   Tobacco Use   Smoking status: Former    Packs/day: 0.50    Years: 10.00    Pack years: 5.00    Types: Cigarettes    Quit date: 04/12/1982    Years since quitting: 38.4   Smokeless tobacco: Never  Substance Use Topics   Alcohol use: No    Alcohol/week: 0.0 standard drinks  Marital Status: Married   ROS  Review of Systems  Constitutional: Negative for malaise/fatigue and weight gain.  HENT:  Positive for hearing loss.   Cardiovascular:  Positive  for syncope. Negative for chest pain, claudication, leg swelling, near-syncope, orthopnea, palpitations and paroxysmal nocturnal dyspnea.  Respiratory:  Negative for shortness of breath.   Musculoskeletal:  Positive for joint pain.  Neurological:  Positive for dizziness.  Objective   Vitals with BMI 08/27/2020 05/27/2020 05/22/2020  Height 5\' 10"  5\' 10"  -  Weight 164 lbs 165 lbs -  BMI 96.28 36.62 -  Systolic - - 947  Diastolic - - 61  Pulse - - 50     Orthostatic VS for the past 72 hrs (Last 3 readings):  Orthostatic BP Patient Position BP Location Cuff Size Orthostatic Pulse  08/27/20 1542 (!) 148/98 Standing Left Arm Normal 80  08/27/20 1541 158/89 Sitting Left Arm Normal 63  08/27/20 1536 141/82 Supine Left Arm Normal  64   Physical Exam Vitals reviewed.  Eyes:     Conjunctiva/sclera: Conjunctivae normal.  Neck:     Thyroid: No thyromegaly.     Vascular: No JVD.  Cardiovascular:     Rate and Rhythm: Normal rate and regular rhythm.     Pulses: Intact distal pulses.          Carotid pulses are 2+ on the right side and 2+ on the left side.      Radial pulses are 2+ on the right side and 2+ on the left side.       Popliteal pulses are 2+ on the right side and 2+ on the left side.       Dorsalis pedis pulses are 1+ on the right side and 1+ on the left side.       Posterior tibial pulses are 1+ on the right side and 1+ on the left side.     Heart sounds: Normal heart sounds, S1 normal and S2 normal. No murmur heard.   No gallop.     Comments: No leg edema, no JVD. Pulmonary:     Effort: Pulmonary effort is normal. No respiratory distress.     Breath sounds: Normal breath sounds. No wheezing, rhonchi or rales.  Abdominal:     General: Bowel sounds are normal.     Palpations: Abdomen is soft.  Musculoskeletal:        General: Normal range of motion.     Right lower leg: No edema.     Left lower leg: No edema.  Skin:    General: Skin is warm and dry.  Neurological:     Mental Status: He is alert.   Laboratory examination:   Recent Labs    01/04/20 1641  NA 143  K 4.1  CL 106  CO2 23  GLUCOSE 88  BUN 13  CREATININE 1.10  CALCIUM 9.4  GFRNONAA 67  GFRAA 78   CMP Latest Ref Rng & Units 01/04/2020 12/28/2018 04/05/2018  Glucose 65 - 99 mg/dL 88 106(H) 99  BUN 8 - 27 mg/dL 13 16 13   Creatinine 0.76 - 1.27 mg/dL 1.10 1.06 1.01  Sodium 134 - 144 mmol/L 143 138 141  Potassium 3.5 - 5.2 mmol/L 4.1 4.7 4.5  Chloride 96 - 106 mmol/L 106 99 101  CO2 20 - 29 mmol/L 23 23 24   Calcium 8.6 - 10.2 mg/dL 9.4 9.9 9.7  Total Protein 6.0 - 8.5 g/dL 6.5 7.5 6.6  Total Bilirubin 0.0 - 1.2 mg/dL 0.5 0.5 0.3  Alkaline Phos 44 - 121 IU/L 65 86 54  AST 0 - 40 IU/L 29 38 28  ALT 0 - 44 IU/L 24 26 27  CBC Latest Ref Rng & Units 01/04/2020 04/05/2018 12/16/2016  WBC 3.4 - 10.8 x10E3/uL 6.2 7.2 6.3  Hemoglobin 13.0 - 17.7 g/dL 13.2 13.5 13.5  Hematocrit 37.5 - 51.0 % 39.3 39.8 42.1  Platelets 150 - 450 x10E3/uL 236 230 282   Lipid Panel Recent Labs    01/04/20 1641  CHOL 131  TRIG 61  LDLCALC 64  HDL 54  CHOLHDL 2.4    HEMOGLOBIN A1C Lab Results  Component Value Date   HGBA1C 5.6 12/22/2017   TSH Recent Labs    01/04/20 1641  TSH 3.130   Allergies  No Known Allergies    Medications Prior to Visit:   Outpatient Medications Prior to Visit  Medication Sig Dispense Refill   aspirin 81 MG tablet Take 81 mg by mouth daily.     atorvastatin (LIPITOR) 40 MG tablet TAKE 1/2 TABLET BY MOUTH DAILY 45 tablet 7   diphenhydrAMINE (BENADRYL) 25 MG tablet Take 25 mg by mouth at bedtime as needed.     eplerenone (INSPRA) 25 MG tablet Take 1 tablet (25 mg total) by mouth daily. 90 tablet 3   FLUoxetine (PROZAC) 40 MG capsule Take 1 capsule (40 mg total) by mouth daily. 90 capsule 3   ibuprofen (ADVIL) 200 MG tablet Take 400 mg by mouth in the morning and at bedtime.     nitroGLYCERIN (NITROSTAT) 0.4 MG SL tablet Place 1 tablet (0.4 mg total) under the tongue every 5 (five) minutes as needed for chest pain. 25 tablet 1   valsartan (DIOVAN) 320 MG tablet Take 1 tablet (320 mg total) by mouth daily. 90 tablet 3   hydrochlorothiazide (MICROZIDE) 12.5 MG capsule Take 1 capsule (12.5 mg total) by mouth every morning. 90 capsule 3   No facility-administered medications prior to visit.     Final Medications at End of Visit    Current Meds  Medication Sig   aspirin 81 MG tablet Take 81 mg by mouth daily.   atorvastatin (LIPITOR) 40 MG tablet TAKE 1/2 TABLET BY MOUTH DAILY   chlorthalidone (HYGROTON) 25 MG tablet Take 1 tablet (25 mg total) by mouth daily.   diphenhydrAMINE (BENADRYL) 25 MG tablet Take 25 mg by mouth at bedtime as needed.   eplerenone (INSPRA) 25 MG tablet Take 1  tablet (25 mg total) by mouth daily.   FLUoxetine (PROZAC) 40 MG capsule Take 1 capsule (40 mg total) by mouth daily.   ibuprofen (ADVIL) 200 MG tablet Take 400 mg by mouth in the morning and at bedtime.   nitroGLYCERIN (NITROSTAT) 0.4 MG SL tablet Place 1 tablet (0.4 mg total) under the tongue every 5 (five) minutes as needed for chest pain.   valsartan (DIOVAN) 320 MG tablet Take 1 tablet (320 mg total) by mouth daily.   [DISCONTINUED] hydrochlorothiazide (MICROZIDE) 12.5 MG capsule Take 1 capsule (12.5 mg total) by mouth every morning.    Radiology:  No results found.  Cardiac Studies:   Coronary Angiography 04/25/2014: PTCA and stenting of the proximal and mid LAD with implantation of a 3.0 x 38 mm resolute DES and balloon angioplasty of the D1 with 2.5 x 15 mm balloon. Mild disease otherwise. LVEF 50-55%  Exercise sestamibi stress test 07/20/2016: 1. The resting electrocardiogram demonstrated normal sinus rhythm, RBBB and no resting arrhythmias.  The stress electrocardiogram was normal.  Occasional PVC. Patient exercised on Bruce protocol for 7:30 minutes and achieved 9.34 METS. Stress test terminated due to fatigue and 95% MPHR achieved (Target HR >85%).  2. The LV is dilated both at rest and stress images. The LV end diastolic volume was  440HK. REST and STRESS images demonstrate decreased tracer uptake in the mid inferoseptal and apical septal segments of the left ventricle.  These defects are related to diaphragmatic attenuation.  The left ventricular ejection fraction was calculated to be 48% bu visually appears to be normal.  This is a low risk study.  Echocardiogram 07/28/2016: Left ventricle cavity is normal in size. Mild concentric hypertrophy of the left ventricle. Normal global wall motion. Normal diastolic filling pattern. Calculated EF 54%. Left atrial cavity is mildly dilated at 4.0 cm. Mild tricuspid regurgitation. Compared to 05/10/2014, no significant change.  EKG:    08/27/2020: sinus rhythm at rate of 64 bpm with borderline first-degree AV block. Left atrial enlargment. Normal axis. Right bundle branch block   01/12/2020: Sinus rhythm at a rate of 64 bpm, With borderline first-degree AV block, left atrial abnormality. Normal axis. Right bundle branch block. Compared to EKG 01/04/2019, no significant change.  Assessment     ICD-10-CM   1. Coronary artery disease involving native coronary artery of native heart without angina pectoris  I25.10 EKG 12-Lead    2. Essential hypertension  V42 Basic metabolic panel    3. Hypercholesteremia  E78.00     4. Syncope and collapse  R55 EKG 12-Lead    PCV ECHOCARDIOGRAM COMPLETE    LONG TERM MONITOR-LIVE TELEMETRY (3-14 DAYS)     Meds ordered this encounter  Medications   chlorthalidone (HYGROTON) 25 MG tablet    Sig: Take 1 tablet (25 mg total) by mouth daily.    Dispense:  90 tablet    Refill:  3   Medications Discontinued During This Encounter  Medication Reason   hydrochlorothiazide (MICROZIDE) 12.5 MG capsule Change in therapy    Recommendations:   Daxen Lanum.  is a 75 y.o. Caucasian male with coronary artery disease with history of non-STEMI  04/25/2014 S/P stenting of the proximal and mid LAD with implantation of a 3.0 x 65mm resolute DES and balloon angioplasty of the D1 with 2.5 x 15 mm balloon. Otherwise diffuse mild disease in other vessels, LVEF 50-55%.  He also has hypertension and orthostatic hypotension and mild hyperlipidemia.    Patient was last seen in our office 01/12/2020 for annual follow-up, at this time patient was stable and recommended for follow-up in our office in 1 year.  However he now presents for urgent visit with concerns of syncope. Given episode of syncope will obtain ambulatory cardiac monitor as well as echocardiogram to evaluate for underlying cardiac etiology. However, my suspicious is low for cardiogenic syncope, rather suspect vasovagal etiology. Given patient's  underlying conduction disease must consider high degree AV block vs pause.   In regard to hypertension, patient's blood pressure is uncontrolled and he is not orthostatic today in the office. Will therefore discontinue hydrochlorothiazide and switch him to chlorthalidone 25 mg daily with repeat BMP in 1 week.   Follow up in 6 weeks, sooner if needed, for results of cardiac testing, hypertension, and syncope.    Alethia Berthold, PA-C 08/31/2020, 9:20 PM Office: [phone]

## 2020-08-27 ENCOUNTER — Encounter: Payer: Self-pay | Admitting: Student

## 2020-08-27 ENCOUNTER — Other Ambulatory Visit: Payer: Self-pay

## 2020-08-27 ENCOUNTER — Ambulatory Visit: Payer: Federal, State, Local not specified - PPO | Admitting: Student

## 2020-08-27 ENCOUNTER — Inpatient Hospital Stay: Payer: Federal, State, Local not specified - PPO

## 2020-08-27 VITALS — Ht 70.0 in | Wt 164.0 lb

## 2020-08-27 DIAGNOSIS — I2510 Atherosclerotic heart disease of native coronary artery without angina pectoris: Secondary | ICD-10-CM

## 2020-08-27 DIAGNOSIS — I10 Essential (primary) hypertension: Secondary | ICD-10-CM

## 2020-08-27 DIAGNOSIS — R55 Syncope and collapse: Secondary | ICD-10-CM

## 2020-08-27 DIAGNOSIS — E7800 Pure hypercholesterolemia, unspecified: Secondary | ICD-10-CM

## 2020-08-27 MED ORDER — CHLORTHALIDONE 25 MG PO TABS: 25.0000 mg | ORAL_TABLET | Freq: Every day | ORAL | 3 refills | Status: DC

## 2020-08-27 NOTE — Patient Instructions (Signed)
Labs 1 week after starting chlorthalidone.  Stop hydrochlorothiazide.

## 2020-09-05 LAB — BASIC METABOLIC PANEL
BUN/Creatinine Ratio: 19 (ref 10–24)
BUN: 24 mg/dL (ref 8–27)
CO2: 22 mmol/L (ref 20–29)
Calcium: 9.7 mg/dL (ref 8.6–10.2)
Chloride: 102 mmol/L (ref 96–106)
Creatinine, Ser: 1.25 mg/dL (ref 0.76–1.27)
Glucose: 109 mg/dL — ABNORMAL HIGH (ref 65–99)
Potassium: 4.7 mmol/L (ref 3.5–5.2)
Sodium: 137 mmol/L (ref 134–144)
eGFR: 61 mL/min/{1.73_m2} (ref >59)

## 2020-09-18 ENCOUNTER — Other Ambulatory Visit: Payer: Self-pay

## 2020-09-18 ENCOUNTER — Ambulatory Visit: Payer: Federal, State, Local not specified - PPO

## 2020-09-18 DIAGNOSIS — R55 Syncope and collapse: Secondary | ICD-10-CM

## 2020-10-08 NOTE — Progress Notes (Signed)
Primary Physician/Referring:  Horald Pollen, MD  Patient ID: Edwin Sevin., male    DOB: September 15, 1945, 75 y.o.   MRN: [id number]  Chief Complaint  Patient presents with   Coronary artery disease involving native coronary artery of   Syncope and collapse   Follow-up    6 week   Results    Monitor and echo   HPI:    Edwin Cedotal.  is a 75 y.o. Caucasian male with coronary artery disease with history of non-STEMI  04/25/2014 S/P stenting of the proximal and mid LAD with implantation of a 3.0 x 110m resolute DES and balloon angioplasty of the D1 with 2.5 x 15 mm balloon. Otherwise diffuse mild disease in other vessels, LVEF 50-55%.  He also has hypertension and orthostatic hypotension and mild hyperlipidemia.  Patient was last seen in our office 08/27/2020 for follow-up of syncopal event, suggestive of vasovagal etiology.  At last visit switch patient from hydrochlorothiazide to chlorthalidone 25 mg daily, repeat BMP remained stable.  Also last visit ordered echocardiogram and Zio patch monitor to evaluate for underlying cardiac etiology of syncopal episode.  Patient now presents for follow-up.  Echocardiogram revealed normal LVEF with grade 1 diastolic dysfunction and no other abnormalities.  Ambulatory cardiac telemetry revealed no significant cardiac arrhythmias.  Patient continues to experience brief episodes of lightheadedness upon standing, he has had no recurrence of syncope.  He also expresses frustration regarding continued dyspnea on exertion and fatigue, which is unchanged compared to previous visit.  Reports home blood pressure readings are very well controlled averaging 115-120/65 mmHg, however he does have readings as low as 1123456mmHg systolic.  He is also now complaining of left-sided chest pain which he describes as an ache primarily at rest and lasting several minutes. Denies palpitations, dyspnea, orthopnea, PND, leg swelling.   Past Medical History:  Diagnosis Date    Arthritis    Depression    Depression    Phreesia 01/03/2020   Hyperlipidemia    controlled with meds    Hypertension    NSTEMI (non-ST elevated myocardial infarction) ([facility] 04/25/2014   Substance abuse ([facility]    Past Surgical History:  Procedure Laterality Date   COLONOSCOPY     HERNIA REPAIR     LEFT HEART CATHETERIZATION WITH CORONARY ANGIOGRAM N/A 04/25/2014   Procedure: LEFT HEART CATHETERIZATION WITH CORONARY ANGIOGRAM;  Surgeon: JLaverda Page MD;  [facility];  Service: Cardiovascular;  Laterality: N/A; with stents placement    POLYPECTOMY     VASECTOMY     Family History  Problem Relation Age of Onset   Cancer Mother        lung   Hypertension Father    Alzheimer's disease Father    Heart disease Sister    Heart disease Brother    Stroke Maternal Grandmother    Alcohol abuse Maternal Grandfather    Cancer Maternal Grandfather    Stroke Paternal Grandmother    Cancer Paternal Grandfather    Colon cancer Neg Hx    Colon polyps Neg Hx    Esophageal cancer Neg Hx    Rectal cancer Neg Hx    Stomach cancer Neg Hx     Social History   Tobacco Use   Smoking status: Former    Packs/day: 0.50    Years: 10.00    Pack years: 5.00    Types: Cigarettes    Quit date: 04/12/1982    Years since quitting: 3365  Smokeless tobacco: Never  Substance Use Topics   Alcohol use: No    Alcohol/week: 0.0 standard drinks  Marital Status: Married   ROS  Review of Systems  Constitutional: Negative for malaise/fatigue and weight gain.  HENT:  Positive for hearing loss.   Cardiovascular:  Positive for chest pain. Negative for claudication, leg swelling, near-syncope, orthopnea, palpitations, paroxysmal nocturnal dyspnea and syncope.  Respiratory:  Negative for shortness of breath.   Musculoskeletal:  Positive for joint pain.  Neurological:  Positive for light-headedness (upon standing).  Objective   Vitals with BMI 10/09/2020 08/27/2020 05/27/2020  Height '5\' 10"'$   '5\' 10"'$  '5\' 10"'$   Weight 165 lbs 164 lbs 165 lbs  BMI 23.68 0000000 [id number]  Systolic 123456 - -  Diastolic 70 - -  Pulse 70 - -    Orthostatic VS for the past 72 hrs (Last 3 readings):  Orthostatic BP Patient Position BP Location Cuff Size Orthostatic Pulse  10/09/20 1537 121/85 Standing Left Arm Normal 80  10/09/20 1536 124/79 Sitting Left Arm Normal 56  10/09/20 1535 126/71 Supine Left Arm Normal 55   Physical Exam Vitals reviewed.  Neck:     Thyroid: No thyromegaly.     Vascular: No JVD.  Cardiovascular:     Rate and Rhythm: Normal rate and regular rhythm.     Pulses: Intact distal pulses.          Carotid pulses are 2+ on the right side and 2+ on the left side.      Radial pulses are 2+ on the right side and 2+ on the left side.       Popliteal pulses are 2+ on the right side and 2+ on the left side.       Dorsalis pedis pulses are 1+ on the right side and 1+ on the left side.       Posterior tibial pulses are 1+ on the right side and 1+ on the left side.     Heart sounds: Normal heart sounds, S1 normal and S2 normal. No murmur heard.   No gallop.     Comments: No leg edema, no JVD. Pulmonary:     Effort: Pulmonary effort is normal. No respiratory distress.     Breath sounds: Normal breath sounds.  Musculoskeletal:        General: Normal range of motion.     Right lower leg: No edema.     Left lower leg: No edema.  Neurological:     Mental Status: He is alert.   Laboratory examination:   Recent Labs    01/04/20 1641 09/04/20 0821  NA 143 137  K 4.1 4.7  CL 106 102  CO2 23 22  GLUCOSE 88 109*  BUN 13 24  CREATININE 1.10 1.25  CALCIUM 9.4 9.7  GFRNONAA 67  --   GFRAA 78  --    CMP Latest Ref Rng & Units 09/04/2020 01/04/2020 12/28/2018  Glucose 65 - 99 mg/dL 109(H) 88 106(H)  BUN 8 - 27 mg/dL '24 13 16  '$ Creatinine 0.76 - 1.27 mg/dL 1.25 1.10 1.06  Sodium 134 - 144 mmol/L 137 143 138  Potassium 3.5 - 5.2 mmol/L 4.7 4.1 4.7  Chloride 96 - 106 mmol/L 102 106 99  CO2  20 - 29 mmol/L '22 23 23  '$ Calcium 8.6 - 10.2 mg/dL 9.7 9.4 9.9  Total Protein 6.0 - 8.5 g/dL - 6.5 7.5  Total Bilirubin 0.0 - 1.2 mg/dL - 0.5 0.5  Alkaline Phos 44 -  121 IU/L - 65 86  AST 0 - 40 IU/L - 29 38  ALT 0 - 44 IU/L - 24 26   CBC Latest Ref Rng & Units 01/04/2020 04/05/2018 12/16/2016  WBC 3.4 - 10.8 x10E3/uL 6.2 7.2 6.3  Hemoglobin 13.0 - 17.7 g/dL 13.2 13.5 13.5  Hematocrit 37.5 - 51.0 % 39.3 39.8 42.1  Platelets 150 - 450 x10E3/uL 236 230 282   Lipid Panel Recent Labs    01/04/20 1641  CHOL 131  TRIG 61  LDLCALC 64  HDL 54  CHOLHDL 2.4    HEMOGLOBIN A1C Lab Results  Component Value Date   HGBA1C 5.6 12/22/2017   TSH Recent Labs    01/04/20 1641  TSH 3.130   Allergies  No Known Allergies   Medications Prior to Visit:   Outpatient Medications Prior to Visit  Medication Sig Dispense Refill   aspirin 81 MG tablet Take 81 mg by mouth daily.     atorvastatin (LIPITOR) 40 MG tablet TAKE 1/2 TABLET BY MOUTH DAILY 45 tablet 7   chlorthalidone (HYGROTON) 25 MG tablet Take 1 tablet (25 mg total) by mouth daily. 90 tablet 3   diphenhydrAMINE (BENADRYL) 25 MG tablet Take 25 mg by mouth at bedtime as needed.     eplerenone (INSPRA) 25 MG tablet Take 1 tablet (25 mg total) by mouth daily. 90 tablet 3   FLUoxetine (PROZAC) 40 MG capsule Take 1 capsule (40 mg total) by mouth daily. 90 capsule 3   ibuprofen (ADVIL) 200 MG tablet Take 400 mg by mouth in the morning and at bedtime.     nitroGLYCERIN (NITROSTAT) 0.4 MG SL tablet Place 1 tablet (0.4 mg total) under the tongue every 5 (five) minutes as needed for chest pain. 25 tablet 1   valsartan (DIOVAN) 320 MG tablet Take 1 tablet (320 mg total) by mouth daily. 90 tablet 3   No facility-administered medications prior to visit.   Final Medications at End of Visit    Current Meds  Medication Sig   aspirin 81 MG tablet Take 81 mg by mouth daily.   atorvastatin (LIPITOR) 40 MG tablet TAKE 1/2 TABLET BY MOUTH DAILY    chlorthalidone (HYGROTON) 25 MG tablet Take 1 tablet (25 mg total) by mouth daily.   diphenhydrAMINE (BENADRYL) 25 MG tablet Take 25 mg by mouth at bedtime as needed.   eplerenone (INSPRA) 25 MG tablet Take 1 tablet (25 mg total) by mouth daily.   FLUoxetine (PROZAC) 40 MG capsule Take 1 capsule (40 mg total) by mouth daily.   ibuprofen (ADVIL) 200 MG tablet Take 400 mg by mouth in the morning and at bedtime.   nitroGLYCERIN (NITROSTAT) 0.4 MG SL tablet Place 1 tablet (0.4 mg total) under the tongue every 5 (five) minutes as needed for chest pain.   valsartan (DIOVAN) 320 MG tablet Take 1 tablet (320 mg total) by mouth daily.   Radiology:  No results found.  Cardiac Studies:   Coronary Angiography 04/25/2014: PTCA and stenting of the proximal and mid LAD with implantation of a 3.0 x 38 mm resolute DES and balloon angioplasty of the D1 with 2.5 x 15 mm balloon. Mild disease otherwise. LVEF 50-55%  Exercise sestamibi stress test 07/20/2016: 1. The resting electrocardiogram demonstrated normal sinus rhythm, RBBB and no resting arrhythmias.  The stress electrocardiogram was normal.  Occasional PVC. Patient exercised on Bruce protocol for 7:30 minutes and achieved 9.34 METS. Stress test terminated due to fatigue and 95% MPHR achieved (Target HR >  85%).  2. The LV is dilated both at rest and stress images. The LV end diastolic volume was  [id number]. REST and STRESS images demonstrate decreased tracer uptake in the mid inferoseptal and apical septal segments of the left ventricle.  These defects are related to diaphragmatic attenuation.  The left ventricular ejection fraction was calculated to be 48% bu visually appears to be normal.  This is a low risk study.  PCV ECHOCARDIOGRAM COMPLETE 09/18/2020 Left ventricle cavity is normal in size and wall thickness. Normal global wall motion. Normal LV systolic function with EF 66%. Doppler evidence of grade I (impaired) diastolic dysfunction, normal LAP. No  significant valvular abnormality. Previous study in 2018 had noted normal diastolic function.   Ambulatory cardiac telemetry 08/27/2020 - 09/10/2020: Predominant underlying rhythm was sinus with first-degree AV block and bundle branch block.  Heart rate 43-175 bpm, average heart rate 75 bpm.  7 episodes of supraventricular tachycardia longest lasting 19 beats.  PACs and PVCs were rare.  Patient's symptoms correlated with sinus rhythm and PACs.  No evidence of A. fib, high degree AV block, VT, or pauses >3 seconds.  EKG:    08/27/2020: sinus rhythm at rate of 64 bpm with borderline first-degree AV block. Left atrial enlargment. Normal axis. Right bundle branch block   01/12/2020: Sinus rhythm at a rate of 64 bpm, With borderline first-degree AV block, left atrial abnormality. Normal axis. Right bundle branch block. Compared to EKG 01/04/2019, no significant change.  Assessment     ICD-10-CM   1. Syncope and collapse  R55     2. Essential hypertension  I10     3. Coronary artery disease involving native coronary artery of native heart without angina pectoris  I25.10 PCV MYOCARDIAL PERFUSION WO LEXISCAN    4. Precordial pain  R07.2 PCV MYOCARDIAL PERFUSION WO LEXISCAN    5. DOE (dyspnea on exertion)  R06.00 PCV MYOCARDIAL PERFUSION WO LEXISCAN     No orders of the defined types were placed in this encounter.  There are no discontinued medications.   Recommendations:   Edwin Mcnelly.  is a 75 y.o. Caucasian male with coronary artery disease with history of non-STEMI  04/25/2014 S/P stenting of the proximal and mid LAD with implantation of a 3.0 x 81m resolute DES and balloon angioplasty of the D1 with 2.5 x 15 mm balloon. Otherwise diffuse mild disease in other vessels, LVEF 50-55%.  He also has hypertension and orthostatic hypotension and mild hyperlipidemia.    Patient was last seen in our office 08/27/2020 for follow-up of syncopal event, suggestive of vasovagal etiology.  At last  visit switch patient from hydrochlorothiazide to chlorthalidone 25 mg daily, repeat BMP remained stable.  Also last visit ordered echocardiogram and Zio patch monitor to evaluate for underlying cardiac etiology of syncopal episode.  Patient now presents for follow-up.  Echocardiogram revealed normal LVEF with grade 1 diastolic dysfunction and no other abnormalities.  Ambulatory cardiac telemetry revealed no significant cardiac arrhythmias.  Patient continues to have symptoms of dyspnea on exertion and decreased exercise tolerance as well as atypical left-sided chest pain.  Given patient's history of CAD and present symptoms we will proceed with exercise nuclear stress test.   Patient is not orthostatic in the office today, blood pressures in fact well controlled.  Advised patient regarding conservative measures to improve positional lightheadedness including standing up slowly and sitting on the edge of bed prior to standing up after lying down.  Patient has had no recurrence  of syncope.  Patient's blood pressure is well controlled, will not make changes to medications at this time.  Follow-up in 3 months, sooner if needed, for dizziness, CAD, hypertension and orthostatic hypotension.   Alethia Berthold, PA-C 10/10/2020, 10:56 AM Office: [phone]

## 2020-10-09 ENCOUNTER — Encounter: Payer: Self-pay | Admitting: Student

## 2020-10-09 ENCOUNTER — Other Ambulatory Visit: Payer: Self-pay

## 2020-10-09 ENCOUNTER — Ambulatory Visit: Payer: Federal, State, Local not specified - PPO | Admitting: Student

## 2020-10-09 VITALS — BP 113/70 | HR 70 | Temp 98.7°F | Resp 16 | Ht 70.0 in | Wt 165.0 lb

## 2020-10-09 DIAGNOSIS — R072 Precordial pain: Secondary | ICD-10-CM

## 2020-10-09 DIAGNOSIS — I10 Essential (primary) hypertension: Secondary | ICD-10-CM

## 2020-10-09 DIAGNOSIS — R0600 Dyspnea, unspecified: Secondary | ICD-10-CM

## 2020-10-09 DIAGNOSIS — R55 Syncope and collapse: Secondary | ICD-10-CM

## 2020-10-09 DIAGNOSIS — I2510 Atherosclerotic heart disease of native coronary artery without angina pectoris: Secondary | ICD-10-CM

## 2020-10-09 DIAGNOSIS — R0609 Other forms of dyspnea: Secondary | ICD-10-CM

## 2020-10-21 ENCOUNTER — Ambulatory Visit: Payer: Federal, State, Local not specified - PPO

## 2020-10-21 ENCOUNTER — Other Ambulatory Visit: Payer: Self-pay

## 2020-10-21 DIAGNOSIS — I2510 Atherosclerotic heart disease of native coronary artery without angina pectoris: Secondary | ICD-10-CM

## 2020-10-21 DIAGNOSIS — R0609 Other forms of dyspnea: Secondary | ICD-10-CM

## 2020-10-21 DIAGNOSIS — R0600 Dyspnea, unspecified: Secondary | ICD-10-CM

## 2020-10-21 DIAGNOSIS — R072 Precordial pain: Secondary | ICD-10-CM

## 2020-10-22 NOTE — Progress Notes (Signed)
Low risk stress test

## 2020-10-22 NOTE — Progress Notes (Signed)
Called and spoke to pt, pt is aware.

## 2020-12-19 ENCOUNTER — Telehealth: Payer: Self-pay

## 2020-12-19 ENCOUNTER — Emergency Department (HOSPITAL_COMMUNITY): Payer: Federal, State, Local not specified - PPO

## 2020-12-19 ENCOUNTER — Emergency Department (HOSPITAL_COMMUNITY)
Admission: EM | Admit: 2020-12-19 | Discharge: 2020-12-19 | Disposition: A | Discharge status: Home / Self Care | Payer: Federal, State, Local not specified - PPO | Attending: Emergency Medicine | Admitting: Emergency Medicine

## 2020-12-19 ENCOUNTER — Other Ambulatory Visit: Payer: Self-pay

## 2020-12-19 DIAGNOSIS — Z7901 Long term (current) use of anticoagulants: Secondary | ICD-10-CM | POA: Diagnosis not present

## 2020-12-19 DIAGNOSIS — R0602 Shortness of breath: Secondary | ICD-10-CM | POA: Diagnosis present

## 2020-12-19 DIAGNOSIS — Z87891 Personal history of nicotine dependence: Secondary | ICD-10-CM | POA: Diagnosis not present

## 2020-12-19 DIAGNOSIS — Z20822 Contact with and (suspected) exposure to covid-19: Secondary | ICD-10-CM | POA: Insufficient documentation

## 2020-12-19 DIAGNOSIS — I2510 Atherosclerotic heart disease of native coronary artery without angina pectoris: Secondary | ICD-10-CM | POA: Insufficient documentation

## 2020-12-19 DIAGNOSIS — Z79899 Other long term (current) drug therapy: Secondary | ICD-10-CM | POA: Diagnosis not present

## 2020-12-19 DIAGNOSIS — I10 Essential (primary) hypertension: Secondary | ICD-10-CM | POA: Insufficient documentation

## 2020-12-19 DIAGNOSIS — R52 Pain, unspecified: Secondary | ICD-10-CM

## 2020-12-19 DIAGNOSIS — E782 Mixed hyperlipidemia: Secondary | ICD-10-CM | POA: Insufficient documentation

## 2020-12-19 DIAGNOSIS — I483 Typical atrial flutter: Secondary | ICD-10-CM | POA: Insufficient documentation

## 2020-12-19 DIAGNOSIS — Z955 Presence of coronary angioplasty implant and graft: Secondary | ICD-10-CM | POA: Insufficient documentation

## 2020-12-19 DIAGNOSIS — I4892 Unspecified atrial flutter: Secondary | ICD-10-CM | POA: Insufficient documentation

## 2020-12-19 LAB — CBC WITH DIFFERENTIAL/PLATELET
Abs Immature Granulocytes: 0.02 10*3/uL (ref 0.00–0.07)
Basophils Absolute: 0.1 10*3/uL (ref 0.0–0.1)
Basophils Relative: 1 %
Eosinophils Absolute: 0.2 10*3/uL (ref 0.0–0.5)
Eosinophils Relative: 2 %
HCT: 40.5 % (ref 39.0–52.0)
Hemoglobin: 13.1 g/dL (ref 13.0–17.0)
Immature Granulocytes: 0 %
Lymphocytes Relative: 36 %
Lymphs Abs: 2.8 10*3/uL (ref 0.7–4.0)
MCH: 29.1 pg (ref 26.0–34.0)
MCHC: 32.3 g/dL (ref 30.0–36.0)
MCV: 90 fL (ref 80.0–100.0)
Monocytes Absolute: 0.6 10*3/uL (ref 0.1–1.0)
Monocytes Relative: 8 %
Neutro Abs: 4.1 10*3/uL (ref 1.7–7.7)
Neutrophils Relative %: 53 %
Platelets: 182 10*3/uL (ref 150–400)
RBC: 4.5 MIL/uL (ref 4.22–5.81)
RDW: 13.6 % (ref 11.5–15.5)
WBC: 7.8 10*3/uL (ref 4.0–10.5)
nRBC: 0 % (ref 0.0–0.2)

## 2020-12-19 LAB — BASIC METABOLIC PANEL
Anion gap: 9 (ref 5–15)
BUN: 21 mg/dL (ref 8–23)
CO2: 22 mmol/L (ref 22–32)
Calcium: 8.8 mg/dL — ABNORMAL LOW (ref 8.9–10.3)
Chloride: 107 mmol/L (ref 98–111)
Creatinine, Ser: 1.31 mg/dL — ABNORMAL HIGH (ref 0.61–1.24)
GFR, Estimated: 58 mL/min — ABNORMAL LOW (ref >60)
Glucose, Bld: 91 mg/dL (ref 70–99)
Potassium: 4.7 mmol/L (ref 3.5–5.1)
Sodium: 138 mmol/L (ref 135–145)

## 2020-12-19 LAB — RESP PANEL BY RT-PCR (FLU A&B, COVID) ARPGX2
Influenza A by PCR: NEGATIVE
Influenza B by PCR: NEGATIVE
SARS Coronavirus 2 by RT PCR: NEGATIVE

## 2020-12-19 LAB — MAGNESIUM: Magnesium: 1.9 mg/dL (ref 1.7–2.4)

## 2020-12-19 LAB — TROPONIN I (HIGH SENSITIVITY)
Troponin I (High Sensitivity): 12 ng/L (ref <18)
Troponin I (High Sensitivity): 14 ng/L (ref <18)

## 2020-12-19 LAB — BRAIN NATRIURETIC PEPTIDE: B Natriuretic Peptide: 135.2 pg/mL — ABNORMAL HIGH (ref 0.0–100.0)

## 2020-12-19 MED ORDER — APIXABAN 5 MG PO TABS
5.0000 mg | ORAL_TABLET | Freq: Two times a day (BID) | ORAL | Status: DC
  Administered 2020-12-19: 5 mg via ORAL
  Filled 2020-12-19: qty 1

## 2020-12-19 MED ORDER — APIXABAN 5 MG PO TABS: 5.0000 mg | ORAL_TABLET | Freq: Two times a day (BID) | ORAL | 3 refills | Status: DC

## 2020-12-19 MED ORDER — DILTIAZEM HCL 30 MG PO TABS: 30.0000 mg | ORAL_TABLET | Freq: Three times a day (TID) | ORAL | 1 refills | Status: DC

## 2020-12-19 MED ORDER — DILTIAZEM HCL 25 MG/5ML IV SOLN
15.0000 mg | Freq: Once | INTRAVENOUS | Status: AC
  Administered 2020-12-19: 15 mg via INTRAVENOUS
  Filled 2020-12-19: qty 5

## 2020-12-19 NOTE — Consult Note (Signed)
CARDIOLOGY CONSULT NOTE  Patient ID: Edwin Mckee. MRN: [id number] DOB/AGE: 1945-08-17 75 y.o.  Admit date: 12/19/2020 Attending physician: Wyvonnia Dusky, MD Primary Physician:  Horald Pollen, MD Outpatient Cardiologist: Dr. Celesta Gentile PA-C Inpatient Cardiologist: Rex Kras, DO, [facility]  Reason of consultation: New onset of atrial flutter  Referring physician: Wyvonnia Dusky, MD  Chief complaint: Rapid heart rate  HPI:  Edwin Mckee. is a 75 y.o. Caucasian male who presents with a chief complaint of " rapid heart rate." His past medical history and cardiovascular risk factors include: History of non-STEMI status post PCI proximal/mid LAD and angioplasty of the first diagonal branch, hypertension, hyperlipidemia, former smoker, advanced age.  Patient is accompanied by his wife [name] at bedside.  Patient has been experiencing rapid heart rate and salvos for the last several months and the most recent episode started 3 days ago on 12/17/2020.  Patient states that with a rapid heart rate he feels tired, fatigued, lightheaded and dizziness with changing positions, short of breath with effort related activities.  Patient played golf this morning and was noted to have a heart rate of 130-140 bpm and with questionable vision changes was referred to the hospital for further evaluation and management.  Cardiology is consulted for new onset of atrial flutter.  At the time of the evaluation patient is resting in ER bed 32 comfortably, is hemodynamically stable, accompanied by his wife.  He denies any chest pain or anginal discomfort.  Clinically appears euvolemic and not in congestive heart failure.  Diagnostic work-up notes mildly elevated BNP, high sensitive troponin negative x1.   ALLERGIES: No Known Allergies  PAST MEDICAL HISTORY: Past Medical History:  Diagnosis Date   Arthritis    Depression    Depression    Phreesia 01/03/2020   Hyperlipidemia     controlled with meds    Hypertension    NSTEMI (non-ST elevated myocardial infarction) ([place]) 04/25/2014   Substance abuse ([place])     PAST SURGICAL HISTORY: Past Surgical History:  Procedure Laterality Date   COLONOSCOPY     HERNIA REPAIR     LEFT HEART CATHETERIZATION WITH CORONARY ANGIOGRAM N/A 04/25/2014   Procedure: LEFT HEART CATHETERIZATION WITH CORONARY ANGIOGRAM;  Surgeon: Laverda Page, MD;  Location: [facility] CATH LAB;  Service: Cardiovascular;  Laterality: N/A; with stents placement    POLYPECTOMY     VASECTOMY      FAMILY HISTORY: The patient family history includes Alcohol abuse in his maternal grandfather; Alzheimer's disease in his father; Cancer in his maternal grandfather, mother, and paternal grandfather; Heart disease in his brother and sister; Hypertension in his father; Stroke in his maternal grandmother and paternal grandmother.   SOCIAL HISTORY:  The patient  reports that he quit smoking about 38 years ago. His smoking use included cigarettes. He has a 5.00 pack-year smoking history. He has never used smokeless tobacco. He reports that he does not drink alcohol and does not use drugs.  MEDICATIONS: Current Outpatient Medications  Medication Instructions   aspirin 81 mg, Oral, Daily at bedtime   atorvastatin (LIPITOR) 40 MG tablet TAKE 1/2 TABLET BY MOUTH DAILY   chlorthalidone (HYGROTON) 25 mg, Oral, Daily   diphenhydrAMINE (BENADRYL) 25 mg, Oral, Daily at bedtime   eplerenone (INSPRA) 25 mg, Oral, Daily   FLUoxetine (PROZAC) 40 mg, Oral, Daily   ibuprofen (ADVIL) 400 mg, Oral, Daily at bedtime   nitroGLYCERIN (NITROSTAT) 0.4 mg, Sublingual, Every 5 min PRN   valsartan (  DIOVAN) 320 mg, Oral, Daily    REVIEW OF SYSTEMS: Review of Systems  Constitutional: Positive for malaise/fatigue. Negative for chills and fever.  HENT:  Negative for hoarse voice and nosebleeds.        Hearing aids  Eyes:  Negative for discharge, double vision and pain.  Cardiovascular:   Positive for dyspnea on exertion and palpitations. Negative for chest pain, claudication, leg swelling, near-syncope, orthopnea, paroxysmal nocturnal dyspnea and syncope.  Respiratory:  Positive for shortness of breath. Negative for hemoptysis.   Musculoskeletal:  Negative for muscle cramps and myalgias.  Gastrointestinal:  Negative for abdominal pain, constipation, diarrhea, hematemesis, hematochezia, melena, nausea and vomiting.  Neurological:  Negative for dizziness and light-headedness.  All other systems reviewed and are negative.  PHYSICAL EXAM: Vitals with BMI 12/19/2020 12/19/2020 12/19/2020  Height - - -  Weight - - -  BMI - - -  Systolic 976 734 193  Diastolic 76 96 80  Pulse 81 102 109    No intake or output data in the 24 hours ending 12/19/20 1956  Net IO Since Admission: No IO data has been entered for this period [12/19/20 1956]   CONSTITUTIONAL: Well-developed and well-nourished. No acute distress.  SKIN: Skin is warm and dry. No rash noted. No cyanosis. No pallor. No jaundice HEAD: Normocephalic and atraumatic.  EYES: No scleral icterus MOUTH/THROAT: Moist oral membranes.  NECK: No JVD present. No thyromegaly noted. No carotid bruits  LYMPHATIC: No visible cervical adenopathy.  CHEST Normal respiratory effort. No intercostal retractions  LUNGS: Clear to auscultation bilaterally.  No stridor. No wheezes. No rales.  CARDIOVASCULAR: Irregularly, irregular, variable S1-S2, no murmurs rubs or gallops appreciated. ABDOMINAL: Soft, nontender, nondistended, positive bowel sounds in all 4 quadrants, no apparent ascites.  EXTREMITIES: No peripheral edema  HEMATOLOGIC: No significant bruising NEUROLOGIC: Oriented to person, place, and time. Nonfocal. Normal muscle tone.  PSYCHIATRIC: Normal mood and affect. Normal behavior. Cooperative  RADIOLOGY: DG Eye Foreign Body  Result Date: 12/19/2020 CLINICAL DATA:  Metal working/exposure; clearance prior to MRI EXAM: ORBITS FOR  FOREIGN BODY - 2 VIEW COMPARISON:  None. FINDINGS: There is no evidence of metallic foreign body within the orbits. No significant bone abnormality identified. IMPRESSION: No evidence of metallic foreign body within the orbits. Electronically Signed   By: Inez Catalina M.D.   On: 12/19/2020 19:53   DG Chest Portable 1 View  Result Date: 12/19/2020 CLINICAL DATA:  Shortness of breath. EXAM: PORTABLE CHEST 1 VIEW COMPARISON:  Chest radiograph 07/30/2008 FINDINGS: The heart size and mediastinal contours are within normal limits. Coronary artery stent. Both lungs are clear. No pleural effusion or pneumothorax. The visualized skeletal structures are unremarkable. IMPRESSION: No acute cardiopulmonary abnormality. Electronically Signed   By: Ileana Roup M.D.   On: 12/19/2020 16:38    LABORATORY DATA: Lab Results  Component Value Date   WBC 7.8 12/19/2020   HGB 13.1 12/19/2020   HCT 40.5 12/19/2020   MCV 90.0 12/19/2020   PLT 182 12/19/2020    Recent Labs  Lab 12/19/20 1541  NA 138  K 4.7  CL 107  CO2 22  BUN 21  CREATININE 1.31*  CALCIUM 8.8*  GLUCOSE 91    Lipid Panel  Lab Results  Component Value Date   CHOL 131 01/04/2020   HDL 54 01/04/2020   LDLCALC 64 01/04/2020   LDLDIRECT 69 02/15/2019   TRIG 61 01/04/2020   CHOLHDL 2.4 01/04/2020    BNP (last 3 results) Recent Labs  12/19/20 1633  BNP 135.2*    HEMOGLOBIN A1C Lab Results  Component Value Date   HGBA1C 5.6 12/22/2017    Cardiac Panel (last 3 results) Recent Labs    12/19/20 1541  TROPONINIHS 12     TSH Recent Labs    01/04/20 1641  TSH 3.130     CARDIAC DATABASE: EKG: 12/19/2020: Atrial flutter 4:1 conduction, right bundle branch block, without underlying injury pattern.  Echocardiogram: 09/18/2020 Left ventricle cavity is normal in size and wall thickness. Normal global wall motion. Normal LV systolic function with EF 66%. Doppler evidence of grade I (impaired) diastolic dysfunction, normal  LAP. No significant valvular abnormality. Previous study in 2018 had noted normal diastolic function.  Stress Testing:  Exercise Myoview stress test 10/21/2020: 1 Day Rest/Stress Protocol. Exercise time 6 minutes 42 seconds on Bruce protocol, achieved 8.0 METS, and 92% of age-predicted maximum heart rate.  Stress ECG nondiagnostic for ischemia due to baseline ST-T changes from RBBB. Without convincing evidence of reversible myocardial ischemia or prior infarct. LVEF per gated SPECT 57%, wall thickness preserved, no regional wall motion abnormalities. Low risk study.  Heart Catheterization: Coronary Angiography 04/25/2014: PTCA and stenting of the proximal and mid LAD with implantation of a 3.0 x 38 mm resolute DES and balloon angioplasty of the D1 with 2.5 x 15 mm balloon. Mild disease otherwise. LVEF 50-55%.  Ambulatory cardiac telemetry 08/27/2020 - 09/10/2020: Predominant underlying rhythm was sinus with first-degree AV block and bundle branch block.  Heart rate 43-175 bpm, average heart rate 75 bpm.  7 episodes of supraventricular tachycardia longest lasting 19 beats.  PACs and PVCs were rare.  Patient's symptoms correlated with sinus rhythm and PACs.  No evidence of A. fib, high degree AV block, VT, or pauses >3 seconds.  IMPRESSION & RECOMMENDATIONS: Donnelle Olmeda. is a 75 y.o. Caucasian male whose past medical history and cardiovascular risk factors include: History of non-STEMI status post PCI proximal/mid LAD and angioplasty of the first diagonal branch, hypertension, hyperlipidemia, former smoker, advanced age.  Impression: Atrial flutter with rapid ventricular rate: Newly discovered Rate control: Cardizem Rhythm control: N/A Thromboembolic prophylaxis: Patient to decide Coumadin versus DOAC. CHA2DS2-VASc SCORE is 3 which correlates to 3.22% risk of stroke per year (HTN, age, history of non-STEMI). I had a long and detailed discussion with the patient regarding the incidence,  etiology, pathophysiology, prognosis, and therapeutic options for atrial flutter.  Specifically we discussed oral anticoagulation for stroke prevention. I reviewed with patient the benefits and risks of initiating/continuing anticoagulation rx, and based on treatment guidelines, I recommended long-term anticoagulation for stroke prevention.  Patient agreed. Patient and wife to discuss whether they would like to proceed with Coumadin versus a DOAC and would let the attending/ER physician know. Recently had an echocardiogram and stress test couple months ago.  Results reviewed. Would focus on rate control strategy for now.  If the patient does not convert to normal sinus rhythm in the next 4 weeks consider either direct-current cardioversion or EP consult for atrial flutter ablation.  Patient and wife are agreeable with the plan of care.  Long-term oral anticoagulation: Indication: Atrial flutter. Discussed the risks, benefits, and alternatives to oral anticoagulation.   Patient and wife are agreeable with proceeding with oral anticoagulation will let the ED physician know with regards to what the choose to proceed with Coumadin versus DOAC.  Benign essential hypertension: Blood pressures are well controlled. Patient prefers not to be on chlorthalidone -agreed.   ED provider we  will transition him to a different antihypertensive medication  Established CAD with prior coronary interventions without angina pectoris: Denies angina pectoris. No use of sublingual nitroglycerin tablet. EKG nonischemic. Continue current medical therapy. Educated importance of secondary prevention.  Hyperlipidemia: Currently on atorvastatin.   He denies myalgia or other side effects. Most recent lipids dated 12/2019 reviewed as noted above. Will monitor for now  Total encounter time 89 minutes. *Total Encounter Time as defined by the Centers for Medicare and Medicaid Services includes, in addition to the  face-to-face time of a patient visit (documented in the note above) non-face-to-face time: obtaining and reviewing outside history, ordering and reviewing medications, tests or procedures, care coordination (communications with other health care professionals or caregivers) and documentation in the medical record.  Patient's questions and concerns were addressed to his satisfaction. He voices understanding of the instructions provided during this encounter.   This note was created using a voice recognition software as a result there may be grammatical errors inadvertently enclosed that do not reflect the nature of this encounter. Every attempt is made to correct such errors.  Mechele Claude [facility]  Pager: [phone] Office: [phone] 12/19/2020, 7:56 PM

## 2020-12-19 NOTE — ED Triage Notes (Signed)
Pt BIB EMS from home after Hr was in the 140's and pt began feeling dizzy, pt reports new diuretic med and has been having increased urination, pt was playing golf earlier today and felt dizzy, when EMS arrived their EKG showed A-fib w/ right bundle branch block. Pt has no hx of afibb and denies chest pain, and nausea but endorses headache

## 2020-12-19 NOTE — Discharge Instructions (Addendum)
Please call your cardiologist office tomorrow to talk about your diagnosis and treatment plan.  We started you on a blood thinner called Eliquis.  Your next dose will be tomorrow.  You need to take this twice a day, once in the morning and once at night (12 hours apart).  If you miss a dose, do not double up on the dose.  Simply take your next scheduled dose.  While taking this medication you should avoid any NSAIDs, including naproxen, aspirin, ibuprofen, Aleve.  These can raise your risk of bleeding.  To keep your heart rate under better control we started you on a medication called diltiazem.  You will take 30 mg tablet three times per day (every 8 hours or so).    Please buy a blood pressure cuff or heart rate monitor to keep at home.  If you ever feel yourself getting dizzy check your heart rate.  If your heart rate is above 130 beats per minute, take an extra 30 mg diltiazem tablet, or call your cardiologist.  You can STOP your chlorthiadone medication at this time, since we are starting you on a different blood pressure medication.  Check your blood pressure every morning as well.  Diltiazem can lower your blood pressure.    Follow up with Dr Einar Gip by calling his office tomorrow.    Information on my medicine - ELIQUIS (apixaban)  This medication education was reviewed with me or my healthcare representative as part of my discharge preparation.   Why was Eliquis prescribed for you? Eliquis was prescribed for you to reduce the risk of forming blood clots that can cause a stroke if you have a medical condition called atrial fibrillation (a type of irregular heartbeat) OR to reduce the risk of a blood clots forming after orthopedic surgery.  What do You need to know about Eliquis ? Take your Eliquis TWICE DAILY - one tablet in the morning and one tablet in the evening with or without food.  It would be best to take the doses about the same time each day.  If you have difficulty  swallowing the tablet whole please discuss with your pharmacist how to take the medication safely.  Take Eliquis exactly as prescribed by your doctor and DO NOT stop taking Eliquis without talking to the doctor who prescribed the medication.  Stopping may increase your risk of developing a new clot or stroke.  Refill your prescription before you run out.  After discharge, you should have regular check-up appointments with your healthcare provider that is prescribing your Eliquis.  In the future your dose may need to be changed if your kidney function or weight changes by a significant amount or as you get older.  What do you do if you miss a dose? If you miss a dose, take it as soon as you remember on the same day and resume taking twice daily.  Do not take more than one dose of ELIQUIS at the same time.  Important Safety Information A possible side effect of Eliquis is bleeding. You should call your healthcare provider right away if you experience any of the following: Bleeding from an injury or your nose that does not stop. Unusual colored urine (red or dark brown) or unusual colored stools (red or black). Unusual bruising for unknown reasons. A serious fall or if you hit your head (even if there is no bleeding).  Some medicines may interact with Eliquis and might increase your risk of bleeding or clotting while  on Eliquis. To help avoid this, consult your healthcare provider or pharmacist prior to using any new prescription or non-prescription medications, including herbals, vitamins, non-steroidal anti-inflammatory drugs (NSAIDs) and supplements.  This website has more information on Eliquis (apixaban): [email or web address].no.

## 2020-12-19 NOTE — Telephone Encounter (Signed)
Patient called our visit with urgent concerns of dizziness lightheadedness and visual changes which he describes as loss of vision in his peripheral visual fields.  Patient has also noted his heart rate to be as high as 140 bpm in the last 3 days.  He denies recurrence of syncope, however does report near syncopal episodes.  Discussed with patient option of being seen in our office on an urgent basis with EKG to evaluate for atrial fibrillation, versus further evaluation in the emergency department, particularly given loss of peripheral vision.  Shared decision was for patient to be evaluated emergently in the ED.  Advised patient that we will happily see him in the ED if needed.    Alethia Berthold, PA-C 12/19/2020, 1:03 PM Office: [phone]

## 2020-12-19 NOTE — ED Provider Notes (Signed)
[facility] EMERGENCY DEPARTMENT Provider Note   CSN: [id number] Arrival date & time: 12/19/20  1426     History Chief Complaint  Patient presents with   Dizziness    Edwin Mckee. is a 75 y.o. male history of hypertension, NSTEMI, intermittent arrhythmia, presenting to ED with dizziness.  Patient reports that he has noticed heart rates been high as the 140s the past 3 days.  He has been feeling dizzy, lightheaded, short of breath.  He reports burning in both of his legs as well.  He feels that his symptoms really initiated when he was started on chlorthalidone for his blood pressure approximately 4 months ago.  He follows with Dr Einar Gip from cardiology.  For BP takes Diovan 320 mg daily, chlorthialidone 25 mg daily, inspra 25 mg daily.  Not on A/C or beta blockers.  No known hx of A Fib.  Medical record review :  Study Highlights  Ambulatory cardiac telemetry 08/27/2020 - 09/10/2020: Predominant underlying rhythm was sinus with first-degree AV block as well as bundle branch block.  Minimum heart rate 43 bpm, maximum heart rate 175 bpm, average heart rate 75 bpm.  Patient had 7 episodes of supraventricular tachycardia longest lasting 19 beats.  PACs and PVCs were rare.  Patient's symptoms correlated with sinus rhythm and PACs. No evidence of atrial fibrillation, high degree AV block, ventricular tachycardia, or pause >3 seconds.    Last perfusion study 10/21/20 reported "low risk" per note Last echo 09/18/20 with EF 65%, grade 1 diastolic dysfunction  HPI     Past Medical History:  Diagnosis Date   Arthritis    Depression    Depression    [employer]rees[employer] 01/03/2020   Hyperlipidemia    controlled with meds    Hypertension    NSTEMI (non-ST elevated myocardial infarction) ([place]) 04/25/2014   Substance abuse [facility])     Patient Active Problem List   Diagnosis Date Noted   Atrial flutter with rapid ventricular response ([place])    Long term (current) use of anticoagulants     Benign hypertension    Mixed hyperlipidemia    Atherosclerosis of native coronary artery of native heart without angina pectoris    History of coronary artery stent placement    IHD (ischemic heart disease) 12/16/2016   History of MI (myocardial infarction) 12/16/2016   History of depression 12/16/2016   Hearing loss 05/04/2012   HYPERLIPIDEMIA 01/26/2008   Essential hypertension 01/26/2008   INGUINAL HERNIA, HX OF 01/26/2008    Past Surgical History:  Procedure Laterality Date   COLONOSCOPY     HERNIA REPAIR     LEFT HEART CATHETERIZATION WITH CORONARY ANGIOGRAM N/A 04/25/2014   Procedure: LEFT HEART CATHETERIZATION WITH CORONARY ANGIOGRAM;  Surgeon: Laverda Page, MD;  Location: [facility] CATH LAB;  Service: Cardiovascular;  Laterality: N/A; with stents placement    POLYPECTOMY     VASECTOMY         Family History  Problem Relation Age of Onset   Cancer Mother        lung   Hypertension Father    Alzheimer's disease Father    Heart disease Sister    Heart disease Brother    Stroke Maternal Grandmother    Alcohol abuse Maternal Grandfather    Cancer Maternal Grandfather    Stroke Paternal Grandmother    Cancer Paternal Grandfather    Colon cancer Neg Hx    Colon polyps Neg Hx    Esophageal cancer Neg Hx  Rectal cancer Neg Hx    Stomach cancer Neg Hx     Social History   Tobacco Use   Smoking status: Former    Packs/day: 0.50    Years: 10.00    Pack years: 5.00    Types: Cigarettes    Quit date: 04/12/1982    Years since quitting: 38.7   Smokeless tobacco: Never  Vaping Use   Vaping Use: Never used  Substance Use Topics   Alcohol use: No    Alcohol/week: 0.0 standard drinks   Drug use: No    Home Medications Prior to Admission medications   Medication Sig Start Date End Date Taking? Authorizing Provider  apixaban (ELIQUIS) 5 MG TABS tablet Take 1 tablet (5 mg total) by mouth 2 (two) times daily. 12/20/20 01/19/21 Yes Tisha Cline, Carola Rhine, MD   atorvastatin (LIPITOR) 40 MG tablet TAKE 1/2 TABLET BY MOUTH DAILY Patient taking differently: Take 20 mg by mouth at bedtime. 02/29/20  Yes Adrian Prows, MD  diltiazem (CARDIZEM) 30 MG tablet Take 1 tablet (30 mg total) by mouth 3 (three) times daily. 12/20/20 01/19/21 Yes Zaylee Cornia, Carola Rhine, MD  diphenhydrAMINE (BENADRYL) 25 MG tablet Take 25 mg by mouth at bedtime.   Yes [provider]  eplerenone (INSPRA) 25 MG tablet Take 1 tablet (25 mg total) by mouth daily. Patient taking differently: Take 25 mg by mouth at bedtime. 01/12/20  Yes Cantwell, Celeste C, PA-C  FLUoxetine (PROZAC) 40 MG capsule Take 1 capsule (40 mg total) by mouth daily. Patient taking differently: Take 40 mg by mouth in the morning. 01/04/20  Yes Posey Boyer, MD  valsartan (DIOVAN) 320 MG tablet Take 1 tablet (320 mg total) by mouth daily. Patient taking differently: Take 320 mg by mouth every evening. 01/12/20  Yes Cantwell, Celeste C, PA-C  nitroGLYCERIN (NITROSTAT) 0.4 MG SL tablet Place 1 tablet (0.4 mg total) under the tongue every 5 (five) minutes as needed for chest pain. Patient not taking: No sig reported 01/04/19   Adrian Prows, MD    Allergies    Patient has no known allergies.  Review of Systems   Review of Systems  Constitutional:  Negative for chills and fever.  HENT:  Negative for ear pain and sore throat.   Eyes:  Negative for pain and visual disturbance.  Respiratory:  Negative for cough and shortness of breath.   Cardiovascular:  Negative for chest pain and palpitations.  Gastrointestinal:  Negative for abdominal pain and vomiting.  Musculoskeletal:  Negative for arthralgias and back pain.  Skin:  Negative for color change and rash.  Neurological:  Positive for dizziness and headaches. Negative for syncope.  All other systems reviewed and are negative.  Physical Exam Updated Vital Signs BP (!) 153/101   Pulse 69   Temp 98.8 F (37.1 C) (Oral)   Resp (!) 23   Ht 5\' 10"  (1.778 m)    Wt 74.8 kg   SpO2 98%   BMI 23.68 kg/m   Physical Exam Constitutional:      General: He is not in acute distress. HENT:     Head: Normocephalic and atraumatic.  Eyes:     Conjunctiva/sclera: Conjunctivae normal.     Pupils: Pupils are equal, round, and reactive to light.  Cardiovascular:     Rate and Rhythm: Normal rate. Rhythm irregular.  Pulmonary:     Effort: Pulmonary effort is normal. No respiratory distress.  Abdominal:     General: There is no distension.  Tenderness: There is no abdominal tenderness.  Musculoskeletal:     Right lower leg: No edema.     Left lower leg: No edema.  Skin:    General: Skin is warm and dry.  Neurological:     General: No focal deficit present.     Mental Status: He is alert. Mental status is at baseline.  Psychiatric:        Mood and Affect: Mood normal.        Behavior: Behavior normal.    ED Results / Procedures / Treatments   Labs (all labs ordered are listed, but only abnormal results are displayed) Labs Reviewed  BASIC METABOLIC PANEL - Abnormal; Notable for the following components:      Result Value   Creatinine, Ser 1.31 (*)    Calcium 8.8 (*)    GFR, Estimated 58 (*)    All other components within normal limits  BRAIN NATRIURETIC PEPTIDE - Abnormal; Notable for the following components:   B Natriuretic Peptide 135.2 (*)    All other components within normal limits  RESP PANEL BY RT-PCR (FLU A&B, COVID) ARPGX2  CBC WITH DIFFERENTIAL/PLATELET  MAGNESIUM  TROPONIN I (HIGH SENSITIVITY)  TROPONIN I (HIGH SENSITIVITY)    EKG EKG Interpretation  Date/Time:  Thursday December 19 2020 15:57:40 EDT Ventricular Rate:  72 PR Interval:    QRS Duration: 139 QT Interval:  415 QTC Calculation: 455 R Axis:   69 Text Interpretation: Atrial flutter with predominant 4:1 AV block Right bundle branch block Minimal ST elevation, inferior leads Confirmed by Octaviano Glow [id number]) on 12/19/2020 4:46:05 PM  Radiology DG Eye  Foreign Body  Result Date: 12/19/2020 CLINICAL DATA:  Metal working/exposure; clearance prior to MRI EXAM: ORBITS FOR FOREIGN BODY - 2 VIEW COMPARISON:  None. FINDINGS: There is no evidence of metallic foreign body within the orbits. No significant bone abnormality identified. IMPRESSION: No evidence of metallic foreign body within the orbits. Electronically Signed   By: Inez Catalina M.D.   On: 12/19/2020 19:53   MR BRAIN WO CONTRAST  Result Date: 12/19/2020 CLINICAL DATA:  Initial evaluation for acute TIA, visual loss./ EXAM: MRI HEAD WITHOUT CONTRAST TECHNIQUE: Multiplanar, multiecho pulse sequences of the brain and surrounding structures were obtained without intravenous contrast. COMPARISON:  None. FINDINGS: Brain: Cerebral volume within normal limits for patient age. No focal parenchymal signal abnormality identified. No abnormal foci of restricted diffusion to suggest acute or subacute ischemia. Gray-white matter differentiation well maintained. No encephalomalacia to suggest chronic infarction. No foci of susceptibility artifact to suggest acute or chronic intracranial hemorrhage. No mass lesion, midline shift or mass effect. No hydrocephalus. No extra-axial fluid collection. Pituitary gland and suprasellar region are normal. Midline structures intact and normal. Vascular: Major intracranial vascular flow voids well maintained and normal in appearance. Skull and upper cervical spine: Craniocervical junction normal. Visualized upper cervical spine within normal limits. Bone marrow signal intensity normal. No scalp soft tissue abnormality. Sinuses/Orbits: Globes and orbital soft tissues within normal limits. Mild mucosal thickening noted within the ethmoidal air cells. Paranasal sinuses are otherwise clear. No mastoid effusion. Other: None. IMPRESSION: Normal brain MRI.  No acute intracranial abnormality identified. Electronically Signed   By: Jeannine Boga M.D.   On: 12/19/2020 21:02   DG Chest  Portable 1 View  Result Date: 12/19/2020 CLINICAL DATA:  Shortness of breath. EXAM: PORTABLE CHEST 1 VIEW COMPARISON:  Chest radiograph 07/30/2008 FINDINGS: The heart size and mediastinal contours are within normal limits. Coronary artery stent. Both  lungs are clear. No pleural effusion or pneumothorax. The visualized skeletal structures are unremarkable. IMPRESSION: No acute cardiopulmonary abnormality. Electronically Signed   By: Ileana Roup M.D.   On: 12/19/2020 16:38    Procedures Procedures   Medications Ordered in ED Medications  diltiazem (CARDIZEM) injection 15 mg (15 mg Intravenous Given 12/19/20 1829)    ED Course  I have reviewed the triage vital signs and the nursing notes.  Pertinent labs & imaging results that were available during my care of the patient were reviewed by me and considered in my medical decision making (see chart for details).  Lightheadedness, palpitations, fatigue  Ddx includes arrhythmia vs anemia vs infection vs atypical ACS vs other  I personally viewed the patient's prior medical records as noted above including his recent cardiac studies.  I personally reviewed and interpreted the patient's labs, chest x-ray, EKG, showing A flutter with no acute ischemic findings, xray unremarkable, trop low, covid negative, BMP and CBC unremarkable, BNP mildly elevated 135.  Doubt significant CHF - suspect his symptoms will improve with HR control  Given his new onset arrhythmia and report of visual deficits earlier today, an MRI brain was ordered to rule out occipital stroke - no evidence of CVA on scan.  Patient started on eliquis here in ED.  Cardiology consulted (see Dr Brennan Bailey note) and agreed with outpatient management and close follow up in the office.  Dr Terri Skains recommended diltiazem 30 mg short-acting Q8H, with titration parameters which were provided to the patient (he can take extra dose if necessary), and agreed with discontinuing chlorthiodione.    Pharmacy staff at bedside educating patient about Eliquis    Clinical Course as of 12/20/20 1042  Thu Dec 19, 2020  2118 No acute stroke - delta trop flat, HR has been controlled, per cardiology consulted okay for discharge on DOAC (eliquis started here) and diltiazem for HR control, pt will f/u with Dr Einar Gip tomorrow.  Pt okay with plan and ready to go home. [MT]  2204 Reassessment the patient's heart rate remains controlled.  He is willing to go home.  Okay for discharge. [MT]    Clinical Course User Index [MT] Marieme Mcmackin, Carola Rhine, MD   MDM Rules/Calculators/A&P  Final Clinical Impression(s) / ED Diagnoses Final diagnoses:  Typical atrial flutter [facility])    Rx / DC Orders ED Discharge Orders          Ordered    apixaban (ELIQUIS) 5 MG TABS tablet  2 times daily        12/19/20 2212    diltiazem (CARDIZEM) 30 MG tablet  3 times daily        12/19/20 2212             Wyvonnia Dusky, MD 12/20/20 1043

## 2020-12-19 NOTE — ED Notes (Signed)
Got patient undressed on the monitor did ekg shown to er provider patient is resting with call bell in reach 

## 2021-01-01 NOTE — Telephone Encounter (Signed)
From pt

## 2021-01-06 NOTE — Telephone Encounter (Signed)
From pt

## 2021-01-15 ENCOUNTER — Ambulatory Visit: Payer: Federal, State, Local not specified - PPO | Admitting: Student

## 2021-01-15 ENCOUNTER — Other Ambulatory Visit: Payer: Self-pay

## 2021-01-15 ENCOUNTER — Encounter: Payer: Self-pay | Admitting: Student

## 2021-01-15 VITALS — BP 146/88 | HR 76 | Resp 16 | Ht 70.0 in | Wt 171.0 lb

## 2021-01-15 DIAGNOSIS — E7800 Pure hypercholesterolemia, unspecified: Secondary | ICD-10-CM

## 2021-01-15 DIAGNOSIS — I10 Essential (primary) hypertension: Secondary | ICD-10-CM

## 2021-01-15 DIAGNOSIS — I2510 Atherosclerotic heart disease of native coronary artery without angina pectoris: Secondary | ICD-10-CM

## 2021-01-15 DIAGNOSIS — I483 Typical atrial flutter: Secondary | ICD-10-CM

## 2021-01-15 MED ORDER — ATORVASTATIN CALCIUM 40 MG PO TABS: 20.0000 mg | ORAL_TABLET | Freq: Every day | ORAL | 3 refills | Status: DC

## 2021-01-15 MED ORDER — DILTIAZEM HCL 30 MG PO TABS: 30.0000 mg | ORAL_TABLET | Freq: Three times a day (TID) | ORAL | 3 refills | Status: DC

## 2021-01-15 MED ORDER — VALSARTAN 320 MG PO TABS: 320.0000 mg | ORAL_TABLET | Freq: Every evening | ORAL | 3 refills | Status: DC

## 2021-01-15 MED ORDER — APIXABAN 5 MG PO TABS: 5.0000 mg | ORAL_TABLET | Freq: Two times a day (BID) | ORAL | 3 refills | Status: DC

## 2021-01-15 NOTE — Progress Notes (Signed)
Primary Physician/Referring:  Horald Pollen, MD  Patient ID: Edwin Sevin., male    DOB: Nov 18, 1945, 75 y.o.   MRN: [id number]  Chief Complaint  Patient presents with   Coronary Artery Disease   Atrial Fibrillation   Follow-up    Repeat EKG   HPI:    Edwin Pasha.  is a 75 y.o. Caucasian male with coronary artery disease with history of non-STEMI  04/25/2014 S/P stenting of the proximal and mid LAD with implantation of a 3.0 x 15mm resolute DES and balloon angioplasty of the D1 with 2.5 x 15 mm balloon. Otherwise diffuse mild disease in other vessels, LVEF 50-55%.  He also has hypertension and orthostatic hypotension and mild hyperlipidemia.  Patient seen in our office 10/09/2020 at which time echocardiogram revealed preserved ejection fraction and cardiac monitor showed no significant arrhythmias.  He later called the office 12/19/2020 with concerns of dizziness and elevated heart rate, as well as visual changes.  He was therefore advised to go to the emergency department.  At the time of evaluation in the emergency department patient presented atrial flutter, he was therefore started on oral anticoagulation as well as rate control with Eliquis and diltiazem.  Patient now presents for follow-up.   Patient continues to have mild intermittent dizziness and fatigue.  States that when he is active his heart rate increases as high as 150 bpm.  He is tolerating anticoagulation without bleeding diathesis.  Patient remains in atrial flutter. Denies palpitations, dyspnea, orthopnea, PND, leg swelling.   Past Medical History:  Diagnosis Date   Arthritis    Depression    Depression    Phreesia 01/03/2020   Hyperlipidemia    controlled with meds    Hypertension    NSTEMI (non-ST elevated myocardial infarction) ([place]) 04/25/2014   Substance abuse ([place])    Past Surgical History:  Procedure Laterality Date   COLONOSCOPY     HERNIA REPAIR     LEFT HEART CATHETERIZATION WITH  CORONARY ANGIOGRAM N/A 04/25/2014   Procedure: LEFT HEART CATHETERIZATION WITH CORONARY ANGIOGRAM;  Surgeon: Laverda Page, MD;  Location: [facility] CATH LAB;  Service: Cardiovascular;  Laterality: N/A; with stents placement    POLYPECTOMY     VASECTOMY     Family History  Problem Relation Age of Onset   Cancer Mother        lung   Hypertension Father    Alzheimer's disease Father    Heart disease Sister    Heart disease Brother    Stroke Maternal Grandmother    Alcohol abuse Maternal Grandfather    Cancer Maternal Grandfather    Stroke Paternal Grandmother    Cancer Paternal Grandfather    Colon cancer Neg Hx    Colon polyps Neg Hx    Esophageal cancer Neg Hx    Rectal cancer Neg Hx    Stomach cancer Neg Hx     Social History   Tobacco Use   Smoking status: Former    Packs/day: 0.50    Years: 10.00    Pack years: 5.00    Types: Cigarettes    Quit date: 04/12/1982    Years since quitting: 38.7   Smokeless tobacco: Never  Substance Use Topics   Alcohol use: No    Alcohol/week: 0.0 standard drinks  Marital Status: Married   ROS  Review of Systems  Constitutional: Positive for malaise/fatigue. Negative for weight gain.  HENT:  Positive for hearing loss.   Cardiovascular:  Negative  for chest pain, claudication, leg swelling, near-syncope, orthopnea, palpitations, paroxysmal nocturnal dyspnea and syncope.  Respiratory:  Negative for shortness of breath.   Musculoskeletal:  Positive for joint pain.  Neurological:  Positive for light-headedness (upon standing).  Objective   Vitals with BMI 01/15/2021 12/19/2020 12/19/2020  Height 5\' 10"  - -  Weight 171 lbs - -  BMI 78.58 - -  Systolic 850 277 412  Diastolic 88 878 676  Pulse 76 69 57    Physical Exam Vitals reviewed.  Neck:     Thyroid: No thyromegaly.     Vascular: No JVD.  Cardiovascular:     Rate and Rhythm: Normal rate and regular rhythm.     Pulses: Intact distal pulses.          Carotid pulses are 2+ on the  right side and 2+ on the left side.      Radial pulses are 2+ on the right side and 2+ on the left side.       Popliteal pulses are 2+ on the right side and 2+ on the left side.       Dorsalis pedis pulses are 1+ on the right side and 1+ on the left side.       Posterior tibial pulses are 1+ on the right side and 1+ on the left side.     Heart sounds: Normal heart sounds, S1 normal and S2 normal. No murmur heard.   No gallop.     Comments: No leg edema, no JVD. Pulmonary:     Effort: Pulmonary effort is normal. No respiratory distress.     Breath sounds: Normal breath sounds.  Musculoskeletal:        General: Normal range of motion.     Right lower leg: No edema.     Left lower leg: No edema.  Neurological:     Mental Status: He is alert.  Physical exam remains unchanged compared to last office visit.  Laboratory examination:   Recent Labs    09/04/20 0821 12/19/20 1541  NA 137 138  K 4.7 4.7  CL 102 107  CO2 22 22  GLUCOSE 109* 91  BUN 24 21  CREATININE 1.25 1.31*  CALCIUM 9.7 8.8*  GFRNONAA  --  58*   CMP Latest Ref Rng & Units 12/19/2020 09/04/2020 01/04/2020  Glucose 70 - 99 mg/dL 91 109(H) 88  BUN 8 - 23 mg/dL 21 24 13   Creatinine 0.61 - 1.24 mg/dL 1.31(H) 1.25 1.10  Sodium 135 - 145 mmol/L 138 137 143  Potassium 3.5 - 5.1 mmol/L 4.7 4.7 4.1  Chloride 98 - 111 mmol/L 107 102 106  CO2 22 - 32 mmol/L 22 22 23   Calcium 8.9 - 10.3 mg/dL 8.8(L) 9.7 9.4  Total Protein 6.0 - 8.5 g/dL - - 6.5  Total Bilirubin 0.0 - 1.2 mg/dL - - 0.5  Alkaline Phos 44 - 121 IU/L - - 65  AST 0 - 40 IU/L - - 29  ALT 0 - 44 IU/L - - 24   CBC Latest Ref Rng & Units 12/19/2020 01/04/2020 04/05/2018  WBC 4.0 - 10.5 K/uL 7.8 6.2 7.2  Hemoglobin 13.0 - 17.0 g/dL 13.1 13.2 13.5  Hematocrit 39.0 - 52.0 % 40.5 39.3 39.8  Platelets 150 - 400 K/uL 182 236 230   Lipid Panel No results for input(s): CHOL, TRIG, LDLCALC, VLDL, HDL, CHOLHDL, LDLDIRECT in the last 8760 hours.   HEMOGLOBIN A1C Lab  Results  Component Value Date   HGBA1C  5.6 12/22/2017   TSH No results for input(s): TSH in the last 8760 hours.  Allergies  No Known Allergies   Medications Prior to Visit:   Outpatient Medications Prior to Visit  Medication Sig Dispense Refill   eplerenone (INSPRA) 25 MG tablet Take 1 tablet (25 mg total) by mouth daily. (Patient taking differently: Take 25 mg by mouth at bedtime.) 90 tablet 3   FLUoxetine (PROZAC) 40 MG capsule Take 1 capsule (40 mg total) by mouth daily. (Patient taking differently: Take 40 mg by mouth in the morning.) 90 capsule 3   nitroGLYCERIN (NITROSTAT) 0.4 MG SL tablet Place 1 tablet (0.4 mg total) under the tongue every 5 (five) minutes as needed for chest pain. 25 tablet 1   apixaban (ELIQUIS) 5 MG TABS tablet Take 1 tablet (5 mg total) by mouth 2 (two) times daily. 60 tablet 3   atorvastatin (LIPITOR) 40 MG tablet TAKE 1/2 TABLET BY MOUTH DAILY (Patient taking differently: Take 20 mg by mouth at bedtime.) 45 tablet 7   diltiazem (CARDIZEM) 30 MG tablet Take 1 tablet (30 mg total) by mouth 3 (three) times daily. 90 tablet 1   valsartan (DIOVAN) 320 MG tablet Take 1 tablet (320 mg total) by mouth daily. (Patient taking differently: Take 320 mg by mouth every evening.) 90 tablet 3   diphenhydrAMINE (BENADRYL) 25 MG tablet Take 25 mg by mouth at bedtime.     No facility-administered medications prior to visit.   Final Medications at End of Visit    Current Meds  Medication Sig   eplerenone (INSPRA) 25 MG tablet Take 1 tablet (25 mg total) by mouth daily. (Patient taking differently: Take 25 mg by mouth at bedtime.)   FLUoxetine (PROZAC) 40 MG capsule Take 1 capsule (40 mg total) by mouth daily. (Patient taking differently: Take 40 mg by mouth in the morning.)   nitroGLYCERIN (NITROSTAT) 0.4 MG SL tablet Place 1 tablet (0.4 mg total) under the tongue every 5 (five) minutes as needed for chest pain.   [DISCONTINUED] apixaban (ELIQUIS) 5 MG TABS tablet Take 1  tablet (5 mg total) by mouth 2 (two) times daily.   [DISCONTINUED] atorvastatin (LIPITOR) 40 MG tablet TAKE 1/2 TABLET BY MOUTH DAILY (Patient taking differently: Take 20 mg by mouth at bedtime.)   [DISCONTINUED] diltiazem (CARDIZEM) 30 MG tablet Take 1 tablet (30 mg total) by mouth 3 (three) times daily.   [DISCONTINUED] valsartan (DIOVAN) 320 MG tablet Take 1 tablet (320 mg total) by mouth daily. (Patient taking differently: Take 320 mg by mouth every evening.)   Radiology:  No results found.  Cardiac Studies:   Coronary Angiography 04/25/2014: PTCA and stenting of the proximal and mid LAD with implantation of a 3.0 x 38 mm resolute DES and balloon angioplasty of the D1 with 2.5 x 15 mm balloon. Mild disease otherwise. LVEF 50-55%  Exercise sestamibi stress test 07/20/2016: 1. The resting electrocardiogram demonstrated normal sinus rhythm, RBBB and no resting arrhythmias.  The stress electrocardiogram was normal.  Occasional PVC. Patient exercised on Bruce protocol for 7:30 minutes and achieved 9.34 METS. Stress test terminated due to fatigue and 95% MPHR achieved (Target HR >85%).  2. The LV is dilated both at rest and stress images. The LV end diastolic volume was  562ZH. REST and STRESS images demonstrate decreased tracer uptake in the mid inferoseptal and apical septal segments of the left ventricle.  These defects are related to diaphragmatic attenuation.  The left ventricular ejection fraction was calculated to  be 48% bu visually appears to be normal.  This is a low risk study.  PCV ECHOCARDIOGRAM COMPLETE 09/18/2020 Left ventricle cavity is normal in size and wall thickness. Normal global wall motion. Normal LV systolic function with EF 66%. Doppler evidence of grade I (impaired) diastolic dysfunction, normal LAP. No significant valvular abnormality. Previous study in 2018 had noted normal diastolic function.   Ambulatory cardiac telemetry 08/27/2020 - 09/10/2020: Predominant underlying  rhythm was sinus with first-degree AV block and bundle branch block.  Heart rate 43-175 bpm, average heart rate 75 bpm.  7 episodes of supraventricular tachycardia longest lasting 19 beats.  PACs and PVCs were rare.  Patient's symptoms correlated with sinus rhythm and PACs.  No evidence of A. fib, high degree AV block, VT, or pauses >3 seconds.  EKG:   01/15/2021: Atrial flutter with variable conduction at a rate of 77 bpm.  Right bundle branch block.  12/19/2020: Atrial flutter 4:1 conduction, right bundle branch block, without underlying injury pattern.  08/27/2020: sinus rhythm at rate of 64 bpm with borderline first-degree AV block. Left atrial enlargment. Normal axis. Right bundle branch block   01/12/2020: Sinus rhythm at a rate of 64 bpm, With borderline first-degree AV block, left atrial abnormality. Normal axis. Right bundle branch block. Compared to EKG 01/04/2019, no significant change.  Assessment     ICD-10-CM   1. Typical atrial flutter (HCC)  I48.3 Ambulatory referral to Cardiac Electrophysiology    2. Coronary artery disease involving native coronary artery of native heart without angina pectoris  I25.10 EKG 12-Lead    atorvastatin (LIPITOR) 40 MG tablet    3. Essential hypertension  I10 valsartan (DIOVAN) 320 MG tablet    4. Hypercholesteremia  E78.00 atorvastatin (LIPITOR) 40 MG tablet     Meds ordered this encounter  Medications   valsartan (DIOVAN) 320 MG tablet    Sig: Take 1 tablet (320 mg total) by mouth every evening.    Dispense:  90 tablet    Refill:  3   atorvastatin (LIPITOR) 40 MG tablet    Sig: Take 0.5 tablets (20 mg total) by mouth at bedtime.    Dispense:  45 tablet    Refill:  3   apixaban (ELIQUIS) 5 MG TABS tablet    Sig: Take 1 tablet (5 mg total) by mouth 2 (two) times daily.    Dispense:  180 tablet    Refill:  3   diltiazem (CARDIZEM) 30 MG tablet    Sig: Take 1 tablet (30 mg total) by mouth 3 (three) times daily.    Dispense:  90 tablet     Refill:  3    Medications Discontinued During This Encounter  Medication Reason   diphenhydrAMINE (BENADRYL) 25 MG tablet Error   valsartan (DIOVAN) 320 MG tablet Reorder   atorvastatin (LIPITOR) 40 MG tablet Reorder   apixaban (ELIQUIS) 5 MG TABS tablet Reorder   diltiazem (CARDIZEM) 30 MG tablet Reorder     Recommendations:   Edwin Mckee.  is a 75 y.o. Caucasian male with coronary artery disease with history of non-STEMI  04/25/2014 S/P stenting of the proximal and mid LAD with implantation of a 3.0 x 61mm resolute DES and balloon angioplasty of the D1 with 2.5 x 15 mm balloon. Otherwise diffuse mild disease in other vessels, LVEF 50-55%.  He also has hypertension and orthostatic hypotension and mild hyperlipidemia.    Patient seen in our office 10/09/2020 at which time echocardiogram revealed preserved ejection fraction and  cardiac monitor showed no significant arrhythmias.  He later called the office 12/19/2020 with concerns of dizziness and elevated heart rate, as well as visual changes.  He was therefore advised to go to the emergency department.  At the time of evaluation in the emergency department patient presented atrial flutter, he was therefore started on oral anticoagulation as well as rate control with Eliquis and diltiazem.  Patient now presents for follow-up.  Patient remains in atrial flutter today.  Therefore discussed with him regarding option of cardioversion versus referral to electrophysiology for ablation consideration.  Discussed alternatives, risks, benefits of both.  Shared decision was to proceed with referral to electrophysiology for consideration of atrial flutter ablation.  We will send referral today.  Counseled patient regarding signs symptoms that would warrant urgent or emergent evaluation.  He is presently well rate controlled with diltiazem, will continue this.  He is also tolerating anticoagulation with Eliquis without bleeding diathesis, will  continue.  Notably patient expresses frustration with inability to golf and be active as usual.  Follow up in 3 months, sooner if needed, for atrial flutter.    Alethia Berthold, PA-C 01/15/2021, 10:16 AM Office: [phone]

## 2021-01-16 NOTE — Progress Notes (Signed)
Electrophysiology Office Note:    Date:  01/17/2021   ID:  Edwin Mckee., DOB 1945/10/28, MRN [id number]  PCP:  Horald Pollen, MD  [facility] Cardiologist:  None  [facility] Electrophysiologist:  None   Referring MD: Alethia Berthold, [place]*   Chief Complaint: Atrial flutter  History of Present Illness:    Edwin Mckee. is a 75 y.o. male who presents for an evaluation of atrial flutter at the request of Lawerance Cruel, PA-C. Their medical history includes depression, hyperlipidemia, NSTEMI, hypertension.  The patient was last seen by [facility] January 15, 2021.  He had previously been seen in their clinic in follow-up after an NSTEMI with a stent to the mid LAD in 2016.  He had an episode of dizziness in October of this year and presented to the emergency department with tachycardia.  He was found to be in typical atrial flutter and was started on Eliquis and diltiazem.  Heart monitor worn in June of this year showed no evidence of atrial fibrillation. He is with his wife today in clinic.  He tells me he is an avid golfer.  He feels fatigued while playing which seems to correspond to the onset of his atrial flutter.    Past Medical History:  Diagnosis Date   Arthritis    Depression    Depression    Phreesia 01/03/2020   Hyperlipidemia    controlled with meds    Hypertension    NSTEMI (non-ST elevated myocardial infarction) ([place]) 04/25/2014   Substance abuse ([place])     Past Surgical History:  Procedure Laterality Date   COLONOSCOPY     HERNIA REPAIR     LEFT HEART CATHETERIZATION WITH CORONARY ANGIOGRAM N/A 04/25/2014   Procedure: LEFT HEART CATHETERIZATION WITH CORONARY ANGIOGRAM;  Surgeon: Laverda Page, MD;  Location: [facility] CATH LAB;  Service: Cardiovascular;  Laterality: N/A; with stents placement    POLYPECTOMY     VASECTOMY      Current Medications: Current Meds  Medication Sig   apixaban (ELIQUIS) 5 MG TABS tablet Take 1 tablet (5  mg total) by mouth 2 (two) times daily.   atorvastatin (LIPITOR) 40 MG tablet Take 0.5 tablets (20 mg total) by mouth at bedtime.   diltiazem (CARDIZEM) 30 MG tablet Take 1 tablet (30 mg total) by mouth 3 (three) times daily.   eplerenone (INSPRA) 25 MG tablet Take 1 tablet (25 mg total) by mouth daily. (Patient taking differently: Take 25 mg by mouth at bedtime.)   FLUoxetine (PROZAC) 40 MG capsule Take 1 capsule (40 mg total) by mouth daily. (Patient taking differently: Take 40 mg by mouth in the morning.)   valsartan (DIOVAN) 320 MG tablet Take 1 tablet (320 mg total) by mouth every evening.     Allergies:   Patient has no known allergies.   Social History   Socioeconomic History   Marital status: Married    Spouse name: Not on file   Number of children: 2   Years of education: Not on file   Highest education level: Not on file  Occupational History   Not on file  Tobacco Use   Smoking status: Former    Packs/day: 0.50    Years: 10.00    Pack years: 5.00    Types: Cigarettes    Quit date: 04/12/1982    Years since quitting: 38.7   Smokeless tobacco: Never  Vaping Use   Vaping Use: Never used  Substance and Sexual  Activity   Alcohol use: No    Alcohol/week: 0.0 standard drinks   Drug use: No   Sexual activity: Yes  Other Topics Concern   Not on file  Social History Narrative   Not on file   Social Determinants of Health   Financial Resource Strain: Not on file  Food Insecurity: Not on file  Transportation Needs: Not on file  Physical Activity: Not on file  Stress: Not on file  Social Connections: Not on file     Family History: The patient's family history includes Alcohol abuse in his maternal grandfather; Alzheimer's disease in his father; Cancer in his maternal grandfather, mother, and paternal grandfather; Heart disease in his brother and sister; Hypertension in his father; Stroke in his maternal grandmother and paternal grandmother. There is no history of  Colon cancer, Colon polyps, Esophageal cancer, Rectal cancer, or Stomach cancer.  ROS:   Please see the history of present illness.    All other systems reviewed and are negative.  EKGs/Labs/Other Studies Reviewed:    The following studies were reviewed today:  September 18, 2020 echo (PCV study) Left ventricle cavity is normal in size and wall thickness. Normal global  wall motion. Normal LV systolic function with EF 66%. Doppler evidence of  grade I (impaired) diastolic dysfunction, normal LAP. No significant  valvular abnormality.  Previous study in 2018 had noted normal diastolic function.  September 24, 2020 heart monitor (PCV study) Ambulatory cardiac telemetry 08/27/2020 - 09/10/2020: Predominant underlying rhythm was sinus with first-degree AV block as well as bundle branch block.  Minimum heart rate 43 bpm, maximum heart rate 175 bpm, average heart rate 75 bpm.  Patient had 7 episodes of supraventricular tachycardia longest lasting 19 beats.  PACs and PVCs were rare.  Patient's symptoms correlated with sinus rhythm and PACs. No evidence of atrial fibrillation, high degree AV block, ventricular tachycardia, or pause >3 seconds.    January 15, 2021 EKG shows typical appearing atrial flutter with variable AV conduction, right bundle branch block  All EKGs reviewed in our system.  No evidence of atrial fibrillation.   EKG:  The ekg ordered today demonstrates typical appearing atrial flutter, right bundle branch block   Recent Labs: 12/19/2020: B Natriuretic Peptide 135.2; BUN 21; Creatinine, Ser 1.31; Hemoglobin 13.1; Magnesium 1.9; Platelets 182; Potassium 4.7; Sodium 138  Recent Lipid Panel    Component Value Date/Time   CHOL 131 01/04/2020 1641   TRIG 61 01/04/2020 1641   HDL 54 01/04/2020 1641   CHOLHDL 2.4 01/04/2020 1641   CHOLHDL 5.2 05/10/2013 1148   VLDL 27 05/10/2013 1148   LDLCALC 64 01/04/2020 1641   LDLDIRECT 69 02/15/2019 1042    Physical Exam:    VS:  BP 122/74    Pulse 76   Ht 5\' 10"  (1.778 m)   Wt 171 lb 12.8 oz (77.9 kg)   SpO2 99%   BMI 24.65 kg/m     Wt Readings from Last 3 Encounters:  01/17/21 171 lb 12.8 oz (77.9 kg)  01/15/21 171 lb (77.6 kg)  12/19/20 165 lb (74.8 kg)     GEN:  Well nourished, well developed in no acute distress.  Appears younger than stated age 66: Normal NECK: No JVD; No carotid bruits LYMPHATICS: No lymphadenopathy CARDIAC: RRR, no murmurs, rubs, gallops RESPIRATORY:  Clear to auscultation without rales, wheezing or rhonchi  ABDOMEN: Soft, non-tender, non-distended MUSCULOSKELETAL:  No edema; No deformity  SKIN: Warm and dry NEUROLOGIC:  Alert and oriented x  3 PSYCHIATRIC:  Normal affect       ASSESSMENT:    1. Typical atrial flutter ([place])   2. Coronary artery disease involving native coronary artery of native heart without angina pectoris   3. Essential hypertension    PLAN:    In order of problems listed above:  1. Typical atrial flutter (HCC) Rate controlled.  Symptomatic.  We discussed management options including cardioversion, continued rate control and catheter ablation.  After discussing the pros and cons of each option, the patient elected to proceed with catheter ablation.  He is familiar with the procedure given his brother has had multiple ablations in [place].  We discussed the procedure in detail include the risk, recovery and efficacy.  He wishes to proceed.  He will continue taking Eliquis uninterrupted.  We will continue diltiazem until the procedure today.  We did discuss the possibility that after a successful atrial flutter ablation that he would develop atrial fibrillation.    Risk, benefits, and alternatives to EP study and radiofrequency ablation for atrial flutter were also discussed in detail today. These risks include but are not limited to stroke, bleeding, vascular damage, tamponade, perforation, damage to the esophagus, lungs, and other structures, pulmonary vein  stenosis, worsening renal function, and death. The patient understands these risk and wishes to proceed.  We will therefore proceed with catheter ablation at the next available time.  Carto, ICE, anesthesia are requested for the procedure.     2. Coronary artery disease involving native coronary artery of native heart without angina pectoris No ischemic symptoms.  Continue Eliquis, atorvastatin.  3. Essential hypertension Controlled.  Continue current medical therapy.       Total time spent with patient today 65 minutes. This includes reviewing records, evaluating the patient and coordinating care.  Medication Adjustments/Labs and Tests Ordered: Current medicines are reviewed at length with the patient today.  Concerns regarding medicines are outlined above.  No orders of the defined types were placed in this encounter.  No orders of the defined types were placed in this encounter.    Signed, Hilton Cork. Quentin Ore, MD, [facility], [facility] 01/17/2021 10:33 AM    Electrophysiology [facility]

## 2021-01-17 ENCOUNTER — Ambulatory Visit: Payer: Federal, State, Local not specified - PPO | Admitting: Cardiology

## 2021-01-17 ENCOUNTER — Other Ambulatory Visit: Payer: Self-pay

## 2021-01-17 ENCOUNTER — Encounter: Payer: Self-pay | Admitting: Cardiology

## 2021-01-17 ENCOUNTER — Telehealth: Payer: Self-pay

## 2021-01-17 VITALS — BP 122/74 | HR 76 | Ht 70.0 in | Wt 171.8 lb

## 2021-01-17 DIAGNOSIS — I2510 Atherosclerotic heart disease of native coronary artery without angina pectoris: Secondary | ICD-10-CM

## 2021-01-17 DIAGNOSIS — I10 Essential (primary) hypertension: Secondary | ICD-10-CM | POA: Diagnosis not present

## 2021-01-17 DIAGNOSIS — I483 Typical atrial flutter: Secondary | ICD-10-CM

## 2021-01-17 NOTE — Telephone Encounter (Signed)
NOTES SCANNED TO REFERRAL 

## 2021-01-17 NOTE — Patient Instructions (Addendum)
Medication Instructions:  Your physician recommends that you continue on your current medications as directed. Please refer to the Current Medication list given to you today. *If you need a refill on your cardiac medications before your next appointment, please call your pharmacy*  Lab Work: None ordered. If you have labs (blood work) drawn today and your tests are completely normal, you will receive your results only by: [place] (if you have MyChart) OR A paper copy in the mail If you have any lab test that is abnormal or we need to change your treatment, we will call you to review the results.  Testing/Procedures: None ordered.  Follow-Up: At [facility], you and your health needs are our priority.  As part of our continuing mission to provide you with exceptional heart care, we have created designated Provider Care Teams.  These Care Teams include your primary Cardiologist (physician) and Advanced Practice Providers (APPs -  Physician Assistants and Nurse Practitioners) who all work together to provide you with the care you need, when you need it.  Your next appointment:    SEE INSTRUCTION LETTER  Cardiac Ablation Cardiac ablation is a procedure to destroy, or ablate, a small amount of heart tissue in very specific places. The heart has many electrical connections. Sometimes these connections are abnormal and can cause the heart to beat very fast or irregularly. Ablating some of the areas that cause problems can improve the heart's rhythm or return it to normal. Ablation may be done for people who: Have Wolff-Parkinson-White syndrome. Have fast heart rhythms (tachycardia). Have taken medicines for an abnormal heart rhythm (arrhythmia) that were not effective or caused side effects. Have a high-risk heartbeat that may be life-threatening. During the procedure, a small incision is made in the neck or the groin, and a long, thin tube (catheter) is inserted into the incision and  moved to the heart. Small devices (electrodes) on the tip of the catheter will send out electrical currents. A type of X-ray (fluoroscopy) will be used to help guide the catheter and to provide images of the heart. Tell a health care provider about: Any allergies you have. All medicines you are taking, including vitamins, herbs, eye drops, creams, and over-the-counter medicines. Any problems you or family members have had with anesthetic medicines. Any blood disorders you have. Any surgeries you have had. Any medical conditions you have, such as kidney failure. Whether you are pregnant or may be pregnant. What are the risks? Generally, this is a safe procedure. However, problems may occur, including: Infection. Bruising and bleeding at the catheter insertion site. Bleeding into the chest, especially into the sac that surrounds the heart. This is a serious complication. Stroke or blood clots. Damage to nearby structures or organs. Allergic reaction to medicines or dyes. Need for a permanent pacemaker if the normal electrical system is damaged. A pacemaker is a small computer that sends electrical signals to the heart and helps your heart beat normally. The procedure not being fully effective. This may not be recognized until months later. Repeat ablation procedures are sometimes done. What happens before the procedure? Medicines Ask your health care provider about: Changing or stopping your regular medicines. This is especially important if you are taking diabetes medicines or blood thinners. Taking medicines such as aspirin and ibuprofen. These medicines can thin your blood. Do not take these medicines unless your health care provider tells you to take them. Taking over-the-counter medicines, vitamins, herbs, and supplements. General instructions Follow instructions from your  health care provider about eating or drinking restrictions. Plan to have someone take you home from the hospital or  clinic. If you will be going home right after the procedure, plan to have someone with you for 24 hours. Ask your health care provider what steps will be taken to prevent infection. What happens during the procedure?  An IV will be inserted into one of your veins. You will be given a medicine to help you relax (sedative). The skin on your neck or groin will be numbed. An incision will be made in your neck or your groin. A needle will be inserted through the incision and into a large vein in your neck or groin. A catheter will be inserted into the needle and moved to your heart. Dye may be injected through the catheter to help your surgeon see the area of the heart that needs treatment. Electrical currents will be sent from the catheter to ablate heart tissue in desired areas. There are three types of energy that may be used to do this: Heat (radiofrequency energy). Laser energy. Extreme cold (cryoablation). When the tissue has been ablated, the catheter will be removed. Pressure will be held on the insertion area to prevent a lot of bleeding. A bandage (dressing) will be placed over the insertion area. The exact procedure may vary among health care providers and hospitals. What happens after the procedure? Your blood pressure, heart rate, breathing rate, and blood oxygen level will be monitored until you leave the hospital or clinic. Your insertion area will be monitored for bleeding. You will need to lie still for a few hours to ensure that you do not bleed from the insertion area. Do not drive for 24 hours or as long as told by your health care provider. Summary Cardiac ablation is a procedure to destroy, or ablate, a small amount of heart tissue using an electrical current. This procedure can improve the heart rhythm or return it to normal. Tell your health care provider about any medical conditions you may have and all medicines you are taking to treat them. This is a safe procedure,  but problems may occur. Problems may include infection, bruising, damage to nearby organs or structures, or allergic reactions to medicines. Follow your health care provider's instructions about eating and drinking before the procedure. You may also be told to change or stop some of your medicines. After the procedure, do not drive for 24 hours or as long as told by your health care provider. This information is not intended to replace advice given to you by your health care provider. Make sure you discuss any questions you have with your health care provider. Document Revised: 01/09/2019 Document Reviewed: 01/09/2019 Elsevier Patient Education  [place].

## 2021-01-17 NOTE — Telephone Encounter (Signed)
ERROR WITH NOTES SCANNED TO REFERRAL

## 2021-02-05 ENCOUNTER — Other Ambulatory Visit: Payer: Self-pay | Admitting: Student

## 2021-02-05 DIAGNOSIS — I10 Essential (primary) hypertension: Secondary | ICD-10-CM

## 2021-02-17 ENCOUNTER — Other Ambulatory Visit: Payer: Federal, State, Local not specified - PPO | Admitting: *Deleted

## 2021-02-17 ENCOUNTER — Other Ambulatory Visit: Payer: Self-pay

## 2021-02-17 DIAGNOSIS — I483 Typical atrial flutter: Secondary | ICD-10-CM

## 2021-02-17 DIAGNOSIS — I2510 Atherosclerotic heart disease of native coronary artery without angina pectoris: Secondary | ICD-10-CM

## 2021-02-17 DIAGNOSIS — I10 Essential (primary) hypertension: Secondary | ICD-10-CM

## 2021-02-17 LAB — CBC WITH DIFFERENTIAL/PLATELET
Basophils Absolute: 0 10*3/uL (ref 0.0–0.2)
Basos: 0 %
EOS (ABSOLUTE): 0.1 10*3/uL (ref 0.0–0.4)
Eos: 2 %
Hematocrit: 37.7 % (ref 37.5–51.0)
Hemoglobin: 12.7 g/dL — ABNORMAL LOW (ref 13.0–17.7)
Lymphocytes Absolute: 3 10*3/uL (ref 0.7–3.1)
Lymphs: 40 %
MCH: 29.1 pg (ref 26.6–33.0)
MCHC: 33.7 g/dL (ref 31.5–35.7)
MCV: 86 fL (ref 79–97)
Monocytes Absolute: 0.6 10*3/uL (ref 0.1–0.9)
Monocytes: 8 %
Neutrophils Absolute: 3.7 10*3/uL (ref 1.4–7.0)
Neutrophils: 50 %
Platelets: 231 10*3/uL (ref 150–450)
RBC: 4.37 x10E6/uL (ref 4.14–5.80)
RDW: 13.9 % (ref 11.6–15.4)
WBC: 7.4 10*3/uL (ref 3.4–10.8)

## 2021-02-17 LAB — BASIC METABOLIC PANEL
BUN/Creatinine Ratio: 14 (ref 10–24)
BUN: 16 mg/dL (ref 8–27)
CO2: 26 mmol/L (ref 20–29)
Calcium: 9.2 mg/dL (ref 8.6–10.2)
Chloride: 109 mmol/L — ABNORMAL HIGH (ref 96–106)
Creatinine, Ser: 1.15 mg/dL (ref 0.76–1.27)
Glucose: 96 mg/dL (ref 70–99)
Potassium: 4 mmol/L (ref 3.5–5.2)
Sodium: 142 mmol/L (ref 134–144)
eGFR: 67 mL/min/{1.73_m2} (ref >59)

## 2021-02-24 ENCOUNTER — Ambulatory Visit (HOSPITAL_COMMUNITY): Payer: Federal, State, Local not specified - PPO | Admitting: Certified Registered Nurse Anesthetist

## 2021-02-24 ENCOUNTER — Other Ambulatory Visit: Payer: Self-pay

## 2021-02-24 ENCOUNTER — Encounter (HOSPITAL_COMMUNITY)
Admission: RE | Disposition: A | Discharge status: Home / Self Care | Payer: Self-pay | Source: Home / Self Care | Attending: Cardiology

## 2021-02-24 ENCOUNTER — Ambulatory Visit (HOSPITAL_COMMUNITY)
Admission: RE | Admit: 2021-02-24 | Discharge: 2021-02-24 | Disposition: A | Discharge status: Home / Self Care | Payer: Federal, State, Local not specified - PPO | Attending: Cardiology | Admitting: Cardiology

## 2021-02-24 DIAGNOSIS — I4892 Unspecified atrial flutter: Secondary | ICD-10-CM | POA: Diagnosis not present

## 2021-02-24 DIAGNOSIS — Z7901 Long term (current) use of anticoagulants: Secondary | ICD-10-CM | POA: Insufficient documentation

## 2021-02-24 DIAGNOSIS — I252 Old myocardial infarction: Secondary | ICD-10-CM | POA: Insufficient documentation

## 2021-02-24 DIAGNOSIS — I2510 Atherosclerotic heart disease of native coronary artery without angina pectoris: Secondary | ICD-10-CM | POA: Insufficient documentation

## 2021-02-24 DIAGNOSIS — I483 Typical atrial flutter: Secondary | ICD-10-CM | POA: Diagnosis not present

## 2021-02-24 DIAGNOSIS — E785 Hyperlipidemia, unspecified: Secondary | ICD-10-CM | POA: Insufficient documentation

## 2021-02-24 DIAGNOSIS — I10 Essential (primary) hypertension: Secondary | ICD-10-CM | POA: Diagnosis not present

## 2021-02-24 DIAGNOSIS — F32A Depression, unspecified: Secondary | ICD-10-CM | POA: Insufficient documentation

## 2021-02-24 DIAGNOSIS — I471 Supraventricular tachycardia: Secondary | ICD-10-CM | POA: Insufficient documentation

## 2021-02-24 HISTORY — PX: A-FLUTTER ABLATION: EP1230

## 2021-02-24 SURGERY — A-FLUTTER ABLATION
Anesthesia: General

## 2021-02-24 MED ORDER — HEPARIN (PORCINE) IN NACL 2000-0.9 UNIT/L-% IV SOLN
INTRAVENOUS | Status: AC
  Filled 2021-02-24: qty 1000

## 2021-02-24 MED ORDER — PHENYLEPHRINE HCL (PRESSORS) 10 MG/ML IV SOLN
INTRAVENOUS | Status: DC | PRN
  Administered 2021-02-24 (×3): 80 ug via INTRAVENOUS

## 2021-02-24 MED ORDER — HEPARIN SODIUM (PORCINE) 1000 UNIT/ML IJ SOLN
INTRAMUSCULAR | Status: AC
  Filled 2021-02-24: qty 10

## 2021-02-24 MED ORDER — PHENYLEPHRINE HCL-NACL 20-0.9 MG/250ML-% IV SOLN
INTRAVENOUS | Status: DC | PRN
  Administered 2021-02-24: 25 ug/min via INTRAVENOUS

## 2021-02-24 MED ORDER — ISOPROTERENOL HCL 0.2 MG/ML IJ SOLN
INTRAVENOUS | Status: DC | PRN
  Administered 2021-02-24: 4 ug/min via INTRAVENOUS

## 2021-02-24 MED ORDER — LIDOCAINE 2% (20 MG/ML) 5 ML SYRINGE
INTRAMUSCULAR | Status: DC | PRN
  Administered 2021-02-24: 60 mg via INTRAVENOUS

## 2021-02-24 MED ORDER — DEXAMETHASONE SODIUM PHOSPHATE 10 MG/ML IJ SOLN
INTRAMUSCULAR | Status: DC | PRN
  Administered 2021-02-24: 5 mg via INTRAVENOUS

## 2021-02-24 MED ORDER — FENTANYL CITRATE (PF) 250 MCG/5ML IJ SOLN
INTRAMUSCULAR | Status: DC | PRN
  Administered 2021-02-24: 100 ug via INTRAVENOUS

## 2021-02-24 MED ORDER — PROPOFOL 10 MG/ML IV BOLUS
INTRAVENOUS | Status: DC | PRN
  Administered 2021-02-24: 40 mg via INTRAVENOUS
  Administered 2021-02-24: 120 mg via INTRAVENOUS

## 2021-02-24 MED ORDER — SUGAMMADEX SODIUM 200 MG/2ML IV SOLN
INTRAVENOUS | Status: DC | PRN
  Administered 2021-02-24: 150 mg via INTRAVENOUS

## 2021-02-24 MED ORDER — ISOPROTERENOL HCL 0.2 MG/ML IJ SOLN
INTRAMUSCULAR | Status: AC
  Filled 2021-02-24: qty 5

## 2021-02-24 MED ORDER — APIXABAN 5 MG PO TABS
5.0000 mg | ORAL_TABLET | Freq: Two times a day (BID) | ORAL | Status: DC
  Administered 2021-02-24: 5 mg via ORAL
  Filled 2021-02-24: qty 1

## 2021-02-24 MED ORDER — HEPARIN (PORCINE) IN NACL 2000-0.9 UNIT/L-% IV SOLN
INTRAVENOUS | Status: DC | PRN
  Administered 2021-02-24 (×2): 1000 mL

## 2021-02-24 MED ORDER — SODIUM CHLORIDE 0.9 % IV SOLN: INTRAVENOUS | Status: DC

## 2021-02-24 MED ORDER — ONDANSETRON HCL 4 MG/2ML IJ SOLN
INTRAMUSCULAR | Status: DC | PRN
  Administered 2021-02-24: 4 mg via INTRAVENOUS

## 2021-02-24 MED ORDER — HEPARIN SODIUM (PORCINE) 1000 UNIT/ML IJ SOLN
INTRAMUSCULAR | Status: DC | PRN
  Administered 2021-02-24: 1000 [IU] via INTRAVENOUS

## 2021-02-24 MED ORDER — ROCURONIUM BROMIDE 10 MG/ML (PF) SYRINGE
PREFILLED_SYRINGE | INTRAVENOUS | Status: DC | PRN
  Administered 2021-02-24: 10 mg via INTRAVENOUS
  Administered 2021-02-24: 70 mg via INTRAVENOUS

## 2021-02-24 SURGICAL SUPPLY — 15 items
CATH 8FR REPROCESSED SOUNDSTAR (CATHETERS) ×2 IMPLANT
CATH 8FR SOUNDSTAR REPROCESSED (CATHETERS) IMPLANT
CATH SMTCH THERMOCOOL SF DF (CATHETERS) ×1 IMPLANT
CATH WEB BI DIR CSDF CRV REPRO (CATHETERS) ×1 IMPLANT
CLOSURE PERCLOSE PROSTYLE (VASCULAR PRODUCTS) ×3 IMPLANT
COVER SWIFTLINK CONNECTOR (BAG) ×1 IMPLANT
PACK EP LATEX FREE (CUSTOM PROCEDURE TRAY) ×2
PACK EP LF (CUSTOM PROCEDURE TRAY) ×1 IMPLANT
PAD DEFIB RADIO PHYSIO CONN (PAD) ×2 IMPLANT
PATCH CARTO3 (PAD) ×1 IMPLANT
SHEATH CARTO VIZIGO SM CVD (SHEATH) ×1 IMPLANT
SHEATH PINNACLE 8F 10CM (SHEATH) ×2 IMPLANT
SHEATH PINNACLE 9F 10CM (SHEATH) ×1 IMPLANT
SHEATH PROBE COVER 6X72 (BAG) ×1 IMPLANT
TUBING SMART ABLATE COOLFLOW (TUBING) ×1 IMPLANT

## 2021-02-24 NOTE — Anesthesia Procedure Notes (Signed)
Procedure Name: Intubation Date/Time: 02/24/2021 11:06 AM Performed by: Glynda Jaeger, CRNA Pre-anesthesia Checklist: Patient identified, Patient being monitored, Timeout performed, Emergency Drugs available and Suction available Patient Re-evaluated:Patient Re-evaluated prior to induction Oxygen Delivery Method: Circle System Utilized Preoxygenation: Pre-oxygenation with 100% oxygen Induction Type: IV induction Ventilation: Mask ventilation without difficulty Laryngoscope Size: Mac Grade View: Grade II Tube type: Oral Tube size: 7.5 mm Number of attempts: 1 Airway Equipment and Method: Stylet Placement Confirmation: ETT inserted through vocal cords under direct vision, positive ETCO2 and breath sounds checked- equal and bilateral Secured at: 21 cm Tube secured with: Tape Dental Injury: Teeth and Oropharynx as per pre-operative assessment

## 2021-02-24 NOTE — Anesthesia Preprocedure Evaluation (Addendum)
Anesthesia Evaluation  Patient identified by MRN, date of birth, ID band Patient awake    Reviewed: Allergy & Precautions, NPO status , Patient's Chart, lab work & pertinent test results  History of Anesthesia Complications Negative for: history of anesthetic complications  Airway Mallampati: II  TM Distance: >3 FB Neck ROM: Full    Dental  (+) Dental Advisory Given, Teeth Intact   Pulmonary neg shortness of breath, neg COPD, neg recent URI, former smoker,    breath sounds clear to auscultation       Cardiovascular hypertension, + Cardiac Stents  + dysrhythmias Atrial Fibrillation  Rhythm:Regular     Neuro/Psych    GI/Hepatic negative GI ROS, Neg liver ROS,   Endo/Other  negative endocrine ROS  Renal/GU negative Renal ROS     Musculoskeletal  (+) Arthritis ,   Abdominal   Peds  Hematology negative hematology ROS (+)   Anesthesia Other Findings   Reproductive/Obstetrics                            Anesthesia Physical Anesthesia Plan  ASA: 3  Anesthesia Plan: General   Post-op Pain Management: Minimal or no pain anticipated   Induction: Intravenous  PONV Risk Score and Plan: 2 and Ondansetron and Dexamethasone  Airway Management Planned: Oral ETT  Additional Equipment: None  Intra-op Plan:   Post-operative Plan: Extubation in OR  Informed Consent: I have reviewed the patients History and Physical, chart, labs and discussed the procedure including the risks, benefits and alternatives for the proposed anesthesia with the patient or authorized representative who has indicated his/her understanding and acceptance.     Dental advisory given  Plan Discussed with: CRNA and Anesthesiologist  Anesthesia Plan Comments:        Anesthesia Quick Evaluation

## 2021-02-24 NOTE — Transfer of Care (Signed)
Immediate Anesthesia Transfer of Care Note  Patient: Edwin Mckee.  Procedure(s) Performed: A-FLUTTER ABLATION  Patient Location: Cath Lab  Anesthesia Type:General  Level of Consciousness: awake, alert , oriented, patient cooperative and responds to stimulation  Airway & Oxygen Therapy: Patient Spontanous Breathing and Patient connected to nasal cannula oxygen  Post-op Assessment: Report given to RN and Post -op Vital signs reviewed and stable  Post vital signs: Reviewed and stable  Last Vitals:  Vitals Value Taken Time  BP 129/80 02/24/21 1246  Temp 36.7 C 02/24/21 1246  Pulse 76 02/24/21 1248  Resp 12 02/24/21 1248  SpO2 98 % 02/24/21 1248  Vitals shown include unvalidated device data.  Last Pain:  Vitals:   02/24/21 1246  TempSrc: Temporal  PainSc: Asleep         Complications: No notable events documented.

## 2021-02-24 NOTE — H&P (Signed)
Electrophysiology Office Note:     Date:  01/17/2021    ID:  Edwin Sevin., DOB December 19, 1945, MRN [id number]   PCP:  Horald Pollen, MD  [facility] Cardiologist:  None  [facility] Electrophysiologist:  None    Referring MD: Alethia Berthold, [place]*    Chief Complaint: Atrial flutter   History of Present Illness:     Edwin Staup. is a 75 y.o. male who presents for an evaluation of atrial flutter at the request of Lawerance Cruel, PA-C. Their medical history includes depression, hyperlipidemia, NSTEMI, hypertension.  The patient was last seen by [facility] January 15, 2021.  He had previously been seen in their clinic in follow-up after an NSTEMI with a stent to the mid LAD in 2016.  He had an episode of dizziness in October of this year and presented to the emergency department with tachycardia.  He was found to be in typical atrial flutter and was started on Eliquis and diltiazem.  Heart monitor worn in June of this year showed no evidence of atrial fibrillation. He is with his wife today in clinic.  He tells me he is an avid golfer.  He feels fatigued while playing which seems to correspond to the onset of his atrial flutter.     Objective        Past Medical History:  Diagnosis Date   Arthritis     Depression     Depression      [employer] 01/03/2020   Hyperlipidemia      controlled with meds    Hypertension     NSTEMI (non-ST elevated myocardial infarction) ([place]) 04/25/2014   Substance abuse ([place])             Past Surgical History:  Procedure Laterality Date   COLONOSCOPY       HERNIA REPAIR       LEFT HEART CATHETERIZATION WITH CORONARY ANGIOGRAM N/A 04/25/2014    Procedure: LEFT HEART CATHETERIZATION WITH CORONARY ANGIOGRAM;  Surgeon: Laverda Page, MD;  Location[facility] CATH LAB;  Service: Cardiovascular;  Laterality: N/A; with stents placement    POLYPECTOMY       VASECTOMY          Current Medications: Active Medications       Current Meds  Medication Sig   apixaban (ELIQUIS) 5 MG TABS tablet Take 1 tablet (5 mg total) by mouth 2 (two) times daily.   atorvastatin (LIPITOR) 40 MG tablet Take 0.5 tablets (20 mg total) by mouth at bedtime.   diltiazem (CARDIZEM) 30 MG tablet Take 1 tablet (30 mg total) by mouth 3 (three) times daily.   eplerenone (INSPRA) 25 MG tablet Take 1 tablet (25 mg total) by mouth daily. (Patient taking differently: Take 25 mg by mouth at bedtime.)   FLUoxetine (PROZAC) 40 MG capsule Take 1 capsule (40 mg total) by mouth daily. (Patient taking differently: Take 40 mg by mouth in the morning.)   valsartan (DIOVAN) 320 MG tablet Take 1 tablet (320 mg total) by mouth every evening.        Allergies:   Patient has no known allergies.    Social History         Socioeconomic History   Marital status: Married      Spouse name: Not on file   Number of children: 2   Years of education: Not on file   Highest education level: Not on file  Occupational History   Not on  file  Tobacco Use   Smoking status: Former      Packs/day: 0.50      Years: 10.00      Pack years: 5.00      Types: Cigarettes      Quit date: 04/12/1982      Years since quitting: 38.7   Smokeless tobacco: Never  Vaping Use   Vaping Use: Never used  Substance and Sexual Activity   Alcohol use: No      Alcohol/week: 0.0 standard drinks   Drug use: No   Sexual activity: Yes  Other Topics Concern   Not on file  Social History Narrative   Not on file    Social Determinants of Health    Financial Resource Strain: Not on file  Food Insecurity: Not on file  Transportation Needs: Not on file  Physical Activity: Not on file  Stress: Not on file  Social Connections: Not on file      Family History: The patient's family history includes Alcohol abuse in his maternal grandfather; Alzheimer's disease in his father; Cancer in his maternal grandfather, mother, and paternal grandfather; Heart disease in his brother and  sister; Hypertension in his father; Stroke in his maternal grandmother and paternal grandmother. There is no history of Colon cancer, Colon polyps, Esophageal cancer, Rectal cancer, or Stomach cancer.   ROS:   Please see the history of present illness.    All other systems reviewed and are negative.   EKGs/Labs/Other Studies Reviewed:     The following studies were reviewed today:   September 18, 2020 echo (PCV study) Left ventricle cavity is normal in size and wall thickness. Normal global  wall motion. Normal LV systolic function with EF 66%. Doppler evidence of  grade I (impaired) diastolic dysfunction, normal LAP. No significant  valvular abnormality.  Previous study in 2018 had noted normal diastolic function.   September 24, 2020 heart monitor (PCV study) Ambulatory cardiac telemetry 08/27/2020 - 09/10/2020: Predominant underlying rhythm was sinus with first-degree AV block as well as bundle branch block.  Minimum heart rate 43 bpm, maximum heart rate 175 bpm, average heart rate 75 bpm.  Patient had 7 episodes of supraventricular tachycardia longest lasting 19 beats.  PACs and PVCs were rare.  Patient's symptoms correlated with sinus rhythm and PACs. No evidence of atrial fibrillation, high degree AV block, ventricular tachycardia, or pause >3 seconds.    January 15, 2021 EKG shows typical appearing atrial flutter with variable AV conduction, right bundle branch block   All EKGs reviewed in our system.  No evidence of atrial fibrillation.    EKG:  The ekg ordered today demonstrates typical appearing atrial flutter, right bundle branch block     Recent Labs: 12/19/2020: B Natriuretic Peptide 135.2; BUN 21; Creatinine, Ser 1.31; Hemoglobin 13.1; Magnesium 1.9; Platelets 182; Potassium 4.7; Sodium 138  Recent Lipid Panel Labs (Brief)          Component Value Date/Time    CHOL 131 01/04/2020 1641    TRIG 61 01/04/2020 1641    HDL 54 01/04/2020 1641    CHOLHDL 2.4 01/04/2020 1641     CHOLHDL 5.2 05/10/2013 1148    VLDL 27 05/10/2013 1148    LDLCALC 64 01/04/2020 1641    LDLDIRECT 69 02/15/2019 1042        Physical Exam:     VS:  BP 122/74   Pulse 76   Ht 5\' 10"  (1.778 m)   Wt 171 lb 12.8 oz (77.9 kg)  SpO2 99%   BMI 24.65 kg/m         Wt Readings from Last 3 Encounters:  01/17/21 171 lb 12.8 oz (77.9 kg)  01/15/21 171 lb (77.6 kg)  12/19/20 165 lb (74.8 kg)      GEN:  Well nourished, well developed in no acute distress.  Appears younger than stated age 27: Normal NECK: No JVD; No carotid bruits LYMPHATICS: No lymphadenopathy CARDIAC: RRR, no murmurs, rubs, gallops RESPIRATORY:  Clear to auscultation without rales, wheezing or rhonchi  ABDOMEN: Soft, non-tender, non-distended MUSCULOSKELETAL:  No edema; No deformity  SKIN: Warm and dry NEUROLOGIC:  Alert and oriented x 3 PSYCHIATRIC:  Normal affect          Assessment     ASSESSMENT:     1. Typical atrial flutter ([place])   2. Coronary artery disease involving native coronary artery of native heart without angina pectoris   3. Essential hypertension     PLAN:     In order of problems listed above:   1. Typical atrial flutter (HCC) Rate controlled.  Symptomatic.  We discussed management options including cardioversion, continued rate control and catheter ablation.  After discussing the pros and cons of each option, the patient elected to proceed with catheter ablation.  He is familiar with the procedure given his brother has had multiple ablations in [place].  We discussed the procedure in detail include the risk, recovery and efficacy.  He wishes to proceed.  He will continue taking Eliquis uninterrupted.  We will continue diltiazem until the procedure today.   We did discuss the possibility that after a successful atrial flutter ablation that he would develop atrial fibrillation.     Risk, benefits, and alternatives to EP study and radiofrequency ablation for atrial flutter were also  discussed in detail today. These risks include but are not limited to stroke, bleeding, vascular damage, tamponade, perforation, damage to the esophagus, lungs, and other structures, pulmonary vein stenosis, worsening renal function, and death. The patient understands these risk and wishes to proceed.  We will therefore proceed with catheter ablation at the next available time.  Carto, ICE, anesthesia are requested for the procedure.       2. Coronary artery disease involving native coronary artery of native heart without angina pectoris No ischemic symptoms.  Continue Eliquis, atorvastatin.   3. Essential hypertension Controlled.  Continue current medical therapy.             Total time spent with patient today 65 minutes. This includes reviewing records, evaluating the patient and coordinating care.   Medication Adjustments/Labs and Tests Ordered: Current medicines are reviewed at length with the patient today.  Concerns regarding medicines are outlined above.  No orders of the defined types were placed in this encounter.   No orders of the defined types were placed in this encounter.       Signed, Hilton Cork. Quentin Ore, MD, [facility], [facility] 01/17/2021 10:33 AM    Electrophysiology [facility]       ------------------------------------------  I have seen, examined the patient, and reviewed the above assessment and plan.    Plan for CTI ablation.  Vickie Epley, MD 02/24/2021 10:25 AM

## 2021-02-24 NOTE — Anesthesia Postprocedure Evaluation (Signed)
Anesthesia Post Note  Patient: [name].  Procedure(s) Performed: A-FLUTTER ABLATION     Patient location during evaluation: Cath Lab Anesthesia Type: General Level of consciousness: awake and alert Pain management: pain level controlled Vital Signs Assessment: post-procedure vital signs reviewed and stable Respiratory status: spontaneous breathing, nonlabored ventilation, respiratory function stable and patient connected to nasal cannula oxygen Cardiovascular status: blood pressure returned to baseline and stable Postop Assessment: no apparent nausea or vomiting Anesthetic complications: no   No notable events documented.  Last Vitals:  Vitals:   02/24/21 1310 02/24/21 1316  BP: 127/72 124/80  Pulse: 72 70  Resp: (!) 9 (!) 0  Temp:  36.6 C  SpO2: 98% 99%    Last Pain:  Vitals:   02/24/21 1316  TempSrc: Temporal  PainSc: 0-No pain                 Cecile Gillispie

## 2021-02-25 ENCOUNTER — Encounter (HOSPITAL_COMMUNITY): Payer: Self-pay | Admitting: Cardiology

## 2021-04-02 ENCOUNTER — Other Ambulatory Visit: Payer: Self-pay

## 2021-04-02 ENCOUNTER — Ambulatory Visit (INDEPENDENT_AMBULATORY_CARE_PROVIDER_SITE_OTHER): Payer: Federal, State, Local not specified - PPO | Admitting: Emergency Medicine

## 2021-04-02 ENCOUNTER — Encounter: Payer: Self-pay | Admitting: Emergency Medicine

## 2021-04-02 VITALS — BP 132/68 | HR 54 | Wt 174.0 lb

## 2021-04-02 DIAGNOSIS — Z13220 Encounter for screening for lipoid disorders: Secondary | ICD-10-CM | POA: Diagnosis not present

## 2021-04-02 DIAGNOSIS — Z0000 Encounter for general adult medical examination without abnormal findings: Secondary | ICD-10-CM | POA: Diagnosis not present

## 2021-04-02 DIAGNOSIS — Z130 Encounter for screening for diseases of the blood and blood-forming organs and certain disorders involving the immune mechanism: Secondary | ICD-10-CM

## 2021-04-02 DIAGNOSIS — G4752 REM sleep behavior disorder: Secondary | ICD-10-CM | POA: Insufficient documentation

## 2021-04-02 DIAGNOSIS — M159 Polyosteoarthritis, unspecified: Secondary | ICD-10-CM

## 2021-04-02 DIAGNOSIS — Z125 Encounter for screening for malignant neoplasm of prostate: Secondary | ICD-10-CM

## 2021-04-02 DIAGNOSIS — Z13228 Encounter for screening for other metabolic disorders: Secondary | ICD-10-CM | POA: Diagnosis not present

## 2021-04-02 DIAGNOSIS — Z1329 Encounter for screening for other suspected endocrine disorder: Secondary | ICD-10-CM

## 2021-04-02 LAB — CBC WITH DIFFERENTIAL/PLATELET
Basophils Absolute: 0 10*3/uL (ref 0.0–0.1)
Basophils Relative: 0.8 % (ref 0.0–3.0)
Eosinophils Absolute: 0.1 10*3/uL (ref 0.0–0.7)
Eosinophils Relative: 0.9 % (ref 0.0–5.0)
HCT: 43.3 % (ref 39.0–52.0)
Hemoglobin: 14 g/dL (ref 13.0–17.0)
Lymphocytes Relative: 34.1 % (ref 12.0–46.0)
Lymphs Abs: 2.2 10*3/uL (ref 0.7–4.0)
MCHC: 32.3 g/dL (ref 30.0–36.0)
MCV: 85.9 fl (ref 78.0–100.0)
Monocytes Absolute: 0.5 10*3/uL (ref 0.1–1.0)
Monocytes Relative: 7.6 % (ref 3.0–12.0)
Neutro Abs: 3.6 10*3/uL (ref 1.4–7.7)
Neutrophils Relative %: 56.6 % (ref 43.0–77.0)
Platelets: 208 10*3/uL (ref 150.0–400.0)
RBC: 5.05 Mil/uL (ref 4.22–5.81)
RDW: 14.6 % (ref 11.5–15.5)
WBC: 6.3 10*3/uL (ref 4.0–10.5)

## 2021-04-02 LAB — COMPREHENSIVE METABOLIC PANEL
ALT: 21 U/L (ref 0–53)
AST: 22 U/L (ref 0–37)
Albumin: 4.3 g/dL (ref 3.5–5.2)
Alkaline Phosphatase: 60 U/L (ref 39–117)
BUN: 16 mg/dL (ref 6–23)
CO2: 28 mEq/L (ref 19–32)
Calcium: 9.5 mg/dL (ref 8.4–10.5)
Chloride: 106 mEq/L (ref 96–112)
Creatinine, Ser: 1.07 mg/dL (ref 0.40–1.50)
GFR: 69.01 mL/min (ref >60.00)
Glucose, Bld: 119 mg/dL — ABNORMAL HIGH (ref 70–99)
Potassium: 4.9 mEq/L (ref 3.5–5.1)
Sodium: 141 mEq/L (ref 135–145)
Total Bilirubin: 0.5 mg/dL (ref 0.2–1.2)
Total Protein: 6.6 g/dL (ref 6.0–8.3)

## 2021-04-02 LAB — LIPID PANEL
Cholesterol: 135 mg/dL (ref 0–200)
HDL: 48.4 mg/dL (ref >39.00)
LDL Cholesterol: 77 mg/dL (ref 0–99)
NonHDL: 86.79
Total CHOL/HDL Ratio: 3
Triglycerides: 48 mg/dL (ref 0.0–149.0)
VLDL: 9.6 mg/dL (ref 0.0–40.0)

## 2021-04-02 LAB — PSA: PSA: 0.59 ng/mL (ref 0.10–4.00)

## 2021-04-02 LAB — HEMOGLOBIN A1C: Hgb A1c MFr Bld: 6.2 % (ref 4.6–6.5)

## 2021-04-02 NOTE — Progress Notes (Signed)
Edwin Mckee. 76 y.o.   Chief Complaint  Patient presents with   Annual Exam    HISTORY OF PRESENT ILLNESS: This is a 76 y.o. adult here for annual exam. Status post recent cardiac ablation last December secondary to A. fib flutter episodes.  Had syncopal episode earlier last year. Presently on Eliquis. Also has history of osteoarthritis with multiple joint pains.  Takes Tylenol occasionally.  Joint pains interfere with quality of sleep. Has been off Prozac for 2 years.  Doing well. No other complaints or medical concerns.  HPI   Prior to Admission medications   Medication Sig Start Date End Date Taking? Authorizing Provider  acetaminophen (TYLENOL) 500 MG tablet Take 1,000 mg by mouth every 8 (eight) hours as needed for moderate pain.   Yes [provider]  apixaban (ELIQUIS) 5 MG TABS tablet Take 1 tablet (5 mg total) by mouth 2 (two) times daily. 01/15/21 01/10/22 Yes Cantwell, Celeste C, PA-C  atorvastatin (LIPITOR) 40 MG tablet Take 0.5 tablets (20 mg total) by mouth at bedtime. 01/15/21  Yes Cantwell, Celeste C, PA-C  diltiazem (CARDIZEM) 30 MG tablet Take 1 tablet (30 mg total) by mouth 3 (three) times daily. 01/15/21 05/15/21 Yes Cantwell, Celeste C, PA-C  eplerenone (INSPRA) 25 MG tablet TAKE 1 TABLET BY MOUTH EVERY DAY 02/05/21  Yes Cantwell, Celeste C, PA-C  FLUoxetine (PROZAC) 40 MG capsule Take 1 capsule (40 mg total) by mouth daily. Patient taking differently: Take 40 mg by mouth in the morning. 01/04/20  Yes Posey Boyer, MD  valsartan (DIOVAN) 320 MG tablet Take 1 tablet (320 mg total) by mouth every evening. 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   Atrial flutter with rapid ventricular response ([place])    Long term (current) use of anticoagulants    Benign hypertension    Mixed hyperlipidemia    Atherosclerosis of native coronary artery of  native heart without angina pectoris    History of coronary artery stent placement    IHD (ischemic heart disease) 12/16/2016   History of MI (myocardial infarction) 12/16/2016   History of depression 12/16/2016   Hearing loss 05/04/2012   HYPERLIPIDEMIA 01/26/2008   Essential hypertension 01/26/2008   INGUINAL HERNIA, HX OF 01/26/2008    Past Medical History:  Diagnosis Date   Arthritis    Depression    Depression    Phreesia 01/03/2020   Hyperlipidemia    controlled with meds    Hypertension    NSTEMI (non-ST elevated myocardial infarction) ([place]) 04/25/2014   Postmenopausal    Substance abuse ([place])     Past Surgical History:  Procedure Laterality Date   A-FLUTTER ABLATION N/A 02/24/2021   Procedure: A-FLUTTER ABLATION;  Surgeon: Vickie Epley, MD;  Location: [facility];  Service: Cardiovascular;  Laterality: N/A;   COLONOSCOPY     HERNIA REPAIR     LEFT HEART CATHETERIZATION WITH CORONARY ANGIOGRAM N/A 04/25/2014   Procedure: LEFT HEART CATHETERIZATION WITH CORONARY ANGIOGRAM;  Surgeon: Laverda Page, MD;  Location: [facility] CATH [facility];  Service: Cardiovascular;  Laterality: N/A; with stents placement    POLYPECTOMY     VASECTOMY      Social History   Socioeconomic History   Marital status: Married    Spouse name: Not on file   Number of children: 2   Years of education: Not on  file   Highest education level: Not on file  Occupational History   Not on file  Tobacco Use   Smoking status: Former    Packs/day: 0.50    Years: 10.00    Pack years: 5.00    Types: Cigarettes    Quit date: 04/12/1982    Years since quitting: 39.0   Smokeless tobacco: Never  Vaping Use   Vaping Use: Never used  Substance and Sexual Activity   Alcohol use: No    Alcohol/week: 0.0 standard drinks   Drug use: No   Sexual activity: Yes  Other Topics Concern   Not on file  Social History Narrative   Not on file   Social Determinants of Health   Financial Resource  Strain: Not on file  Food Insecurity: Not on file  Transportation Needs: Not on file  Physical Activity: Not on file  Stress: Not on file  Social Connections: Not on file  Intimate Partner Violence: Not on file    Family History  Problem Relation Age of Onset   Cancer Mother        lung   Hypertension Father    Alzheimer's disease Father    Heart disease Sister    Heart disease Brother    Stroke Maternal Grandmother    Alcohol abuse Maternal Grandfather    Cancer Maternal Grandfather    Stroke Paternal Grandmother    Cancer Paternal Grandfather    Colon cancer Neg Hx    Colon polyps Neg Hx    Esophageal cancer Neg Hx    Rectal cancer Neg Hx    Stomach cancer Neg Hx      Review of Systems  Constitutional: Negative.  Negative for chills and fever.  HENT:  Positive for hearing loss (Uses hearing aids bilaterally). Negative for congestion and sore throat.   Respiratory: Negative.  Negative for cough and shortness of breath.   Cardiovascular: Negative.  Negative for chest pain and palpitations.  Gastrointestinal: Negative.  Negative for abdominal pain, nausea and vomiting.  Genitourinary: Negative.  Negative for dysuria and hematuria.  Musculoskeletal:  Positive for joint pain.  Skin: Negative.  Negative for rash.  Neurological: Negative.  Negative for dizziness and headaches.  All other systems reviewed and are negative.  Today's Vitals   04/02/21 0911  BP: 132/68  Pulse: (!) 54  SpO2: 97%  Weight: 174 lb (78.9 kg)   Body mass index is 24.97 kg/m. Wt Readings from Last 3 Encounters:  04/02/21 174 lb (78.9 kg)  02/24/21 165 lb (74.8 kg)  01/17/21 171 lb 12.8 oz (77.9 kg)    Physical Exam Vitals reviewed.  Constitutional:      Appearance: Normal appearance.  HENT:     Head: Normocephalic.     Right Ear: Tympanic membrane, ear canal and external ear normal.     Left Ear: Tympanic membrane, ear canal and external ear normal.     Mouth/Throat:     Mouth:  Mucous membranes are moist.     Pharynx: Oropharynx is clear.  Eyes:     Extraocular Movements: Extraocular movements intact.     Conjunctiva/sclera: Conjunctivae normal.     Pupils: Pupils are equal, round, and reactive to light.  Cardiovascular:     Rate and Rhythm: Normal rate and regular rhythm.     Pulses: Normal pulses.     Heart sounds: Normal heart sounds.  Pulmonary:     Effort: Pulmonary effort is normal.     Breath sounds: Normal  breath sounds.  Abdominal:     General: Bowel sounds are normal. There is no distension.     Palpations: Abdomen is soft. There is no mass.     Tenderness: There is no abdominal tenderness.  Musculoskeletal:        General: Deformity ([place] and Bouchard nodes) present. Normal range of motion.     Cervical back: Normal range of motion and neck supple. No tenderness.     Right lower leg: No edema.     Left lower leg: No edema.  Lymphadenopathy:     Cervical: No cervical adenopathy.  Skin:    General: Skin is warm and dry.     Capillary Refill: Capillary refill takes less than 2 seconds.  Neurological:     General: No focal deficit present.     Mental Status: He is alert and oriented to person, place, and time.  Psychiatric:        Mood and Affect: Mood normal.        Behavior: Behavior normal.    ASSESSMENT & PLAN: Problem List Items Addressed This Visit   None Visit Diagnoses     Routine general medical examination at a health care facility    -  Primary   Prostate cancer screening       Relevant Orders   PSA(Must document that pt has been informed of limitations of PSA testing.)   Screening for deficiency anemia       Relevant Orders   CBC with Differential   Screening for lipoid disorders       Relevant Orders   Lipid panel   Screening for endocrine, metabolic and immunity disorder       Relevant Orders   Comprehensive metabolic panel   Hemoglobin A1c   Primary osteoarthritis involving multiple joints       Relevant  Orders   Ambulatory referral to Rheumatology      Modifiable risk factors discussed with patient. Anticipatory guidance according to age provided. The following topics were also discussed: Social Determinants of Health Smoking.  Non-smoker Diet and nutrition Benefits of exercise Cancer screening and review of colonoscopy report done in 2022. Prostate cancer screening Vaccinations recommendations Cardiovascular risk assessment The 10-year ASCVD risk score (Arnett DK, et al., 2019) is: 22%   Values used to calculate the score:     Age: 63 years     Sex: Male     Is Non-Hispanic African American: No     Diabetic: No     Tobacco smoker: No     Systolic Blood Pressure: 774 mmHg     Is BP treated: Yes     HDL Cholesterol: 54 mg/dL     Total Cholesterol: 131 mg/dL Mental health including depression and anxiety Fall and accident prevention  Patient Instructions  Health Maintenance, Male Adopting a healthy lifestyle and getting preventive care are important in promoting health and wellness. Ask your health care provider about: The right schedule for you to have regular tests and exams. Things you can do on your own to prevent diseases and keep yourself healthy. What should I know about diet, weight, and exercise? Eat a healthy diet  Eat a diet that includes plenty of vegetables, fruits, low-fat dairy products, and lean protein. Do not eat a lot of foods that are high in solid fats, added sugars, or sodium. Maintain a healthy weight Body mass index (BMI) is a measurement that can be used to identify possible weight problems. It estimates body fat  based on height and weight. Your health care provider can help determine your BMI and help you achieve or maintain a healthy weight. Get regular exercise Get regular exercise. This is one of the most important things you can do for your health. Most adults should: Exercise for at least 150 minutes each week. The exercise should increase  your heart rate and make you sweat (moderate-intensity exercise). Do strengthening exercises at least twice a week. This is in addition to the moderate-intensity exercise. Spend less time sitting. Even light physical activity can be beneficial. Watch cholesterol and blood lipids Have your blood tested for lipids and cholesterol at 74 years of age, then have this test every 5 years. You may need to have your cholesterol levels checked more often if: Your lipid or cholesterol levels are high. You are older than 74 years of age. You are at high risk for heart disease. What should I know about cancer screening? Many types of cancers can be detected early and may often be prevented. Depending on your health history and family history, you may need to have cancer screening at various ages. This may include screening for: Colorectal cancer. Prostate cancer. Skin cancer. Lung cancer. What should I know about heart disease, diabetes, and high blood pressure? Blood pressure and heart disease High blood pressure causes heart disease and increases the risk of stroke. This is more likely to develop in people who have high blood pressure readings or are overweight. Talk with your health care provider about your target blood pressure readings. Have your blood pressure checked: Every 3-5 years if you are 72-64 years of age. Every year if you are 40 years old or older. If you are between the ages of 20 and 7 and are a current or former smoker, ask your health care provider if you should have a one-time screening for abdominal aortic aneurysm (AAA). Diabetes Have regular diabetes screenings. This checks your fasting blood sugar level. Have the screening done: Once every three years after age 75 if you are at a normal weight and have a low risk for diabetes. More often and at a younger age if you are overweight or have a high risk for diabetes. What should I know about preventing infection? Hepatitis B If  you have a higher risk for hepatitis B, you should be screened for this virus. Talk with your health care provider to find out if you are at risk for hepatitis B infection. Hepatitis C Blood testing is recommended for: Everyone born from [birth year] through 1963. Anyone with known risk factors for hepatitis C. Sexually transmitted infections (STIs) You should be screened each year for STIs, including gonorrhea and chlamydia, if: You are sexually active and are younger than 74 years of age. You are older than 75 years of age and your health care provider tells you that you are at risk for this type of infection. Your sexual activity has changed since you were last screened, and you are at increased risk for chlamydia or gonorrhea. Ask your health care provider if you are at risk. Ask your health care provider about whether you are at high risk for HIV. Your health care provider may recommend a prescription medicine to help prevent HIV infection. If you choose to take medicine to prevent HIV, you should first get tested for HIV. You should then be tested every 3 months for as long as you are taking the medicine. Follow these instructions at home: Alcohol use Do not drink alcohol if  your health care provider tells you not to drink. If you drink alcohol: Limit how much you have to 0-2 drinks a day. Know how much alcohol is in your drink. In the [place] one drink equals one 12 oz bottle of beer (355 mL), one 5 oz glass of wine (148 mL), or one 1 oz glass of hard liquor (44 mL). Lifestyle Do not use any products that contain nicotine or tobacco. These products include cigarettes, chewing tobacco, and vaping devices, such as e-cigarettes. If you need help quitting, ask your health care provider. Do not use street drugs. Do not share needles. Ask your health care provider for help if you need support or information about quitting drugs. General instructions Schedule regular health, dental, and eye  exams. Stay current with your vaccines. Tell your health care provider if: You often feel depressed. You have ever been abused or do not feel safe at home. Summary Adopting a healthy lifestyle and getting preventive care are important in promoting health and wellness. Follow your health care provider's instructions about healthy diet, exercising, and getting tested or screened for diseases. Follow your health care provider's instructions on monitoring your cholesterol and blood pressure. This information is not intended to replace advice given to you by your health care provider. Make sure you discuss any questions you have with your health care provider. Document Revised: 07/22/2020 Document Reviewed: 07/22/2020 Elsevier Patient Education  2022 [place], MD [facility]

## 2021-04-02 NOTE — Patient Instructions (Signed)

## 2021-04-05 ENCOUNTER — Other Ambulatory Visit: Payer: Self-pay | Admitting: Student

## 2021-04-16 ENCOUNTER — Ambulatory Visit: Payer: Federal, State, Local not specified - PPO | Admitting: Student

## 2021-04-16 ENCOUNTER — Encounter: Payer: Self-pay | Admitting: Student

## 2021-04-16 ENCOUNTER — Other Ambulatory Visit: Payer: Self-pay | Admitting: Student

## 2021-04-16 ENCOUNTER — Other Ambulatory Visit: Payer: Self-pay

## 2021-04-16 VITALS — BP 136/78 | HR 67 | Temp 98.0°F | Ht 70.0 in | Wt 177.0 lb

## 2021-04-16 DIAGNOSIS — I10 Essential (primary) hypertension: Secondary | ICD-10-CM

## 2021-04-16 DIAGNOSIS — I483 Typical atrial flutter: Secondary | ICD-10-CM

## 2021-04-16 NOTE — Progress Notes (Signed)
Primary Physician/Referring:  Horald Pollen, MD  Patient ID: Edwin Mckee., adult    DOB: 1945/06/04, 76 y.o.   MRN: [id number]  Chief Complaint  Patient presents with   Atrial Fibrillation   Follow-up   HPI:    Edwin Mckee.  is a 76 y.o. Caucasian male with coronary artery disease with history of non-STEMI  04/25/2014 S/P stenting of the proximal and mid LAD with implantation of a 3.0 x 64mm resolute DES and balloon angioplasty of the D1 with 2.5 x 15 mm balloon. Otherwise diffuse mild disease in other vessels, LVEF 50-55%.  He also has hypertension and orthostatic hypotension and mild hyperlipidemia.  Patient presented to the ED 12/19/2020 in atrial flutter which time he was started on anticoagulation and rate control.  Patient remained in atrial flutter upon follow-up and therefore was referred to electrophysiology for further evaluation and management.  He subsequently underwent successful radiofrequency ablation 02/24/2021 by Dr. Quentin Ore. Patient now presents for follow up.  Patient is feeling well overall without specific complaint.  He has had resolution of dizziness, fatigue, shortness of breath following ablation of a flutter.  Past Medical History:  Diagnosis Date   Arthritis    Depression    Depression    Phreesia 01/03/2020   Hyperlipidemia    controlled with meds    Hypertension    NSTEMI (non-ST elevated myocardial infarction) ([place]) 04/25/2014   Postmenopausal    Substance abuse ([place])    Past Surgical History:  Procedure Laterality Date   A-FLUTTER ABLATION N/A 02/24/2021   Procedure: A-FLUTTER ABLATION;  Surgeon: Vickie Epley, MD;  Location: [facility];  Service: Cardiovascular;  Laterality: N/A;   COLONOSCOPY     HERNIA REPAIR     LEFT HEART CATHETERIZATION WITH CORONARY ANGIOGRAM N/A 04/25/2014   Procedure: LEFT HEART CATHETERIZATION WITH CORONARY ANGIOGRAM;  Surgeon: Laverda Page, MD;  Location: [facility] C[facility] LAB;  Service:  Cardiovascular;  Laterality: N/A; with stents placement    POLYPECTOMY     VASECTOMY     Family History  Problem Relation Age of Onset   Cancer Mother        lung   Hypertension Father    Alzheimer's disease Father    Heart disease Sister    Heart disease Brother    Stroke Maternal Grandmother    Alcohol abuse Maternal Grandfather    Cancer Maternal Grandfather    Stroke Paternal Grandmother    Cancer Paternal Grandfather    Colon cancer Neg Hx    Colon polyps Neg Hx    Esophageal cancer Neg Hx    Rectal cancer Neg Hx    Stomach cancer Neg Hx    Social History   Tobacco Use   Smoking status: Former    Packs/day: 0.50    Years: 10.00    Pack years: 5.00    Types: Cigarettes    Quit date: 04/12/1982    Years since quitting: 39.0   Smokeless tobacco: Never  Substance Use Topics   Alcohol use: No    Alcohol/week: 0.0 standard drinks  Marital Status: Married   ROS  Review of Systems  Constitutional: Negative for malaise/fatigue.  Cardiovascular:  Negative for chest pain, claudication, leg swelling, near-syncope, orthopnea, palpitations, paroxysmal nocturnal dyspnea and syncope.  Respiratory:  Negative for shortness of breath.   Neurological:  Negative for dizziness.  Objective   Vitals with BMI 04/16/2021 04/02/2021 02/24/2021  Height 5\' 10"  - -  Weight  177 lbs 174 lbs -  BMI 29.5 - -  Systolic 621 308 657  Diastolic 78 68 67  Pulse 67 54 77    Physical Exam Vitals reviewed.  Neck:     Vascular: No carotid bruit or JVD.  Cardiovascular:     Rate and Rhythm: Normal rate and regular rhythm.     Pulses: Intact distal pulses.          Carotid pulses are 2+ on the right side and 2+ on the left side.      Radial pulses are 2+ on the right side and 2+ on the left side.       Popliteal pulses are 2+ on the right side and 2+ on the left side.       Dorsalis pedis pulses are 1+ on the right side and 1+ on the left side.       Posterior tibial pulses are 1+ on the right  side and 1+ on the left side.     Heart sounds: Normal heart sounds, S1 normal and S2 normal. No murmur heard.   No gallop.     Comments: Femoral access sites well healed without evidence of hematoma.  Pulmonary:     Effort: Pulmonary effort is normal.     Breath sounds: Normal breath sounds.  Musculoskeletal:        General: Normal range of motion.     Right lower leg: No edema.     Left lower leg: No edema.  Neurological:     Mental Status: He is alert.   Laboratory examination:   Recent Labs    12/19/20 1541 02/17/21 0941 04/02/21 1012  NA 138 142 141  K 4.7 4.0 4.9  CL 107 109* 106  CO2 22 26 28   GLUCOSE 91 96 119*  BUN 21 16 16   CREATININE 1.31* 1.15 1.07  CALCIUM 8.8* 9.2 9.5  GFRNONAA 58*  --   --    CMP Latest Ref Rng & Units 04/02/2021 02/17/2021 12/19/2020  Glucose 70 - 99 mg/dL 119(H) 96 91  BUN 6 - 23 mg/dL 16 16 21   Creatinine 0.40 - 1.50 mg/dL 1.07 1.15 1.31(H)  Sodium 135 - 145 mEq/L 141 142 138  Potassium 3.5 - 5.1 mEq/L 4.9 4.0 4.7  Chloride 96 - 112 mEq/L 106 109(H) 107  CO2 19 - 32 mEq/L 28 26 22   Calcium 8.4 - 10.5 mg/dL 9.5 9.2 8.8(L)  Total Protein 6.0 - 8.3 g/dL 6.6 - -  Total Bilirubin 0.2 - 1.2 mg/dL 0.5 - -  Alkaline Phos 39 - 117 U/L 60 - -  AST 0 - 37 U/L 22 - -  ALT 0 - 53 U/L 21 - -   CBC Latest Ref Rng & Units 04/02/2021 02/17/2021 12/19/2020  WBC 4.0 - 10.5 K/uL 6.3 7.4 7.8  Hemoglobin 13.0 - 17.0 g/dL 14.0 12.7(L) 13.1  Hematocrit 39.0 - 52.0 % 43.3 37.7 40.5  Platelets 150.0 - 400.0 K/uL 208.0 231 182   Lipid Panel Recent Labs    04/02/21 1012  CHOL 135  TRIG 48.0  LDLCALC 77  VLDL 9.6  HDL 48.40  CHOLHDL 3     HEMOGLOBIN A1C Lab Results  Component Value Date   HGBA1C 6.2 04/02/2021   TSH No results for input(s): TSH in the last 8760 hours.  Allergies  No Known Allergies   Medications Prior to Visit:   Outpatient Medications Prior to Visit  Medication Sig Dispense Refill   acetaminophen (TYLENOL)  500 MG tablet  Take 1,000 mg by mouth every 8 (eight) hours as needed for moderate pain.     apixaban (ELIQUIS) 5 MG TABS tablet Take 1 tablet (5 mg total) by mouth 2 (two) times daily. 180 tablet 3   atorvastatin (LIPITOR) 40 MG tablet Take 0.5 tablets (20 mg total) by mouth at bedtime. 45 tablet 3   diltiazem (CARDIZEM) 30 MG tablet TAKE 1 TABLET BY MOUTH 3 TIMES DAILY. 270 tablet 1   eplerenone (INSPRA) 25 MG tablet TAKE 1 TABLET BY MOUTH EVERY DAY 90 tablet 3   valsartan (DIOVAN) 320 MG tablet Take 1 tablet (320 mg total) by mouth every evening. 90 tablet 3   No facility-administered medications prior to visit.   Final Medications at End of Visit    Current Meds  Medication Sig   acetaminophen (TYLENOL) 500 MG tablet Take 1,000 mg by mouth every 8 (eight) hours as needed for moderate pain.   apixaban (ELIQUIS) 5 MG TABS tablet Take 1 tablet (5 mg total) by mouth 2 (two) times daily.   atorvastatin (LIPITOR) 40 MG tablet Take 0.5 tablets (20 mg total) by mouth at bedtime.   diltiazem (CARDIZEM) 30 MG tablet TAKE 1 TABLET BY MOUTH 3 TIMES DAILY.   eplerenone (INSPRA) 25 MG tablet TAKE 1 TABLET BY MOUTH EVERY DAY   valsartan (DIOVAN) 320 MG tablet Take 1 tablet (320 mg total) by mouth every evening.   Radiology:  No results found.  Cardiac Studies:   Coronary Angiography 04/25/2014: PTCA and stenting of the proximal and mid LAD with implantation of a 3.0 x 38 mm resolute DES and balloon angioplasty of the D1 with 2.5 x 15 mm balloon. Mild disease otherwise. LVEF 50-55%  Exercise sestamibi stress test 07/20/2016: 1. The resting electrocardiogram demonstrated normal sinus rhythm, RBBB and no resting arrhythmias.  The stress electrocardiogram was normal.  Occasional PVC. Patient exercised on Bruce protocol for 7:30 minutes and achieved 9.34 METS. Stress test terminated due to fatigue and 95% MPHR achieved (Target HR >85%).  2. The LV is dilated both at rest and stress images. The LV end diastolic volume  was  921JH. REST and STRESS images demonstrate decreased tracer uptake in the mid inferoseptal and apical septal segments of the left ventricle.  These defects are related to diaphragmatic attenuation.  The left ventricular ejection fraction was calculated to be 48% bu visually appears to be normal.  This is a low risk study.  PCV ECHOCARDIOGRAM COMPLETE 09/18/2020 Left ventricle cavity is normal in size and wall thickness. Normal global wall motion. Normal LV systolic function with EF 66%. Doppler evidence of grade I (impaired) diastolic dysfunction, normal LAP. No significant valvular abnormality. Previous study in 2018 had noted normal diastolic function.   Ambulatory cardiac telemetry 08/27/2020 - 09/10/2020: Predominant underlying rhythm was sinus with first-degree AV block and bundle branch block.  Heart rate 43-175 bpm, average heart rate 75 bpm.  7 episodes of supraventricular tachycardia longest lasting 19 beats.  PACs and PVCs were rare.  Patient's symptoms correlated with sinus rhythm and PACs.  No evidence of A. fib, high degree AV block, VT, or pauses >3 seconds.  Atrial flutter ablation 02/24/2021:  1. Isthmus-dependent counter clockwise right atrial flutter.   2. Successful radiofrequency ablation of atrial flutter along the cavotricuspid isthmus with complete bidirectional isthmus block achieved.   3. No inducible arrhythmias following ablation.   4. No early apparent complications.   EKG:   04/16/2021: Sinus rhythm at a  rate of 62 bpm.  01/15/2021: Atrial flutter with variable conduction at a rate of 77 bpm.  Right bundle branch block.  12/19/2020: Atrial flutter 4:1 conduction, right bundle branch block, without underlying injury pattern.  08/27/2020: sinus rhythm at rate of 64 bpm with borderline first-degree AV block. Left atrial enlargment. Normal axis. Right bundle branch block   01/12/2020: Sinus rhythm at a rate of 64 bpm, With borderline first-degree AV block, left atrial  abnormality. Normal axis. Right bundle branch block. Compared to EKG 01/04/2019, no significant change.  Assessment     ICD-10-CM   1. Typical atrial flutter ([place]) s/p ablation 02/2021  I48.3 EKG 12-Lead    2. Essential hypertension  I10      No orders of the defined types were placed in this encounter.   There are no discontinued medications.    Recommendations:   Edwin Mckee.  is a 76 y.o. Caucasian male with coronary artery disease with history of non-STEMI  04/25/2014 S/P stenting of the proximal and mid LAD with implantation of a 3.0 x 67mm resolute DES and balloon angioplasty of the D1 with 2.5 x 15 mm balloon. Otherwise diffuse mild disease in other vessels, LVEF 50-55%.  He also has hypertension and orthostatic hypotension and mild hyperlipidemia.    Patient presented to the ED 12/19/2020 in atrial flutter which time he was started on anticoagulation and rate control.  Patient remained in atrial flutter upon follow-up and therefore was referred to electrophysiology for further evaluation and management.  He subsequently underwent successful radiofrequency ablation 02/24/2021 by Dr. Quentin Ore. Patient now presents for follow up.  Patient's symptoms of fatigue and dizziness as well as shortness of breath have resolved with a flutter ablation.  He continues to tolerate anticoagulation without bleeding diathesis.  EKG today reveals he is maintaining sinus rhythm.  Blood pressure is well controlled.   Patient is glad to be back to golfing.  He has follow-up scheduled with Dr. Quentin Ore and March, will defer further medication management regarding atrial flutter to him.  Patient is otherwise stable from a cardiovascular standpoint.  Follow-up in 6 months, sooner if needed.   Alethia Berthold, PA-C 04/16/2021, 9:59 AM Office: [phone]

## 2021-05-28 ENCOUNTER — Other Ambulatory Visit: Payer: Self-pay

## 2021-05-28 ENCOUNTER — Encounter: Payer: Self-pay | Admitting: Cardiology

## 2021-05-28 ENCOUNTER — Ambulatory Visit: Payer: Federal, State, Local not specified - PPO | Admitting: Cardiology

## 2021-05-28 VITALS — BP 130/86 | HR 86 | Ht 70.0 in | Wt 176.9 lb

## 2021-05-28 DIAGNOSIS — I483 Typical atrial flutter: Secondary | ICD-10-CM | POA: Diagnosis not present

## 2021-05-28 DIAGNOSIS — I10 Essential (primary) hypertension: Secondary | ICD-10-CM

## 2021-05-28 DIAGNOSIS — I2510 Atherosclerotic heart disease of native coronary artery without angina pectoris: Secondary | ICD-10-CM | POA: Diagnosis not present

## 2021-05-28 NOTE — Patient Instructions (Signed)
Medication Instructions:  ?Your physician has recommended you make the following change in your medication:  ? ?** Stop Eliquis ? ?** Stop Diltiazem ? ?** Eplerenone ? ?*If you need a refill on your cardiac medications before your next appointment, please call your pharmacy* ? ? ?Lab Work: ?None ordered. ? ?If you have labs (blood work) drawn today and your tests are completely normal, you will receive your results only by: ?MyChart Message (if you have MyChart) OR ?A paper copy in the mail ?If you have any lab test that is abnormal or we need to change your treatment, we will call you to review the results. ? ? ?Testing/Procedures: ?None ordered. ? ? ? ?Follow-Up: ?At [facility], you and your health needs are our priority.  As part of our continuing mission to provide you with exceptional heart care, we have created designated Provider Care Teams.  These Care Teams include your primary Cardiologist (physician) and Advanced Practice Providers (APPs -  Physician Assistants and Nurse Practitioners) who all work together to provide you with the care you need, when you need it. ? ?We recommend signing up for the patient portal called "MyChart".  Sign up information is provided on this After Visit Summary.  MyChart is used to connect with patients for Virtual Visits (Telemedicine).  Patients are able to view lab/test results, encounter notes, upcoming appointments, etc.  Non-urgent messages can be sent to your provider as well.   ?To learn more about what you can do with MyChart, go to [email or web address].   ? ?Your next appointment:   ?12 months with Dr Quentin Ore ?

## 2021-05-28 NOTE — Progress Notes (Signed)
?Electrophysiology Office Follow up Visit Note:   ? ?Date:  05/28/2021  ? ?ID:  Edwin Sevin., DOB 11/08/1945, MRN [id number] ? ?PCP:  Horald Pollen, MD  ?[facility] Cardiologist:  None  ?[facility] Electrophysiologist:  None  ? ? ?Interval History:   ? ?Edwin Merlo. is a 76 y.o. adult who presents for a follow up visit. They were last seen in clinic 01/17/2021. ?Since their last appointment, they underwent successful atrial flutter ablation on 02/24/2021. He followed up with Edwin Cruel, PA-C on 04/16/2021. At that visit he was doing well and his dizziness, fatigue, and shortness of breath had resolved. ? ?Overall, he is feeling pretty good with no complaints. He denies any recurrent weakness, shortness of breath, or lightheadedness. His smart watch has not detected any irregularities. ? ?He remains compliant with Eliquis and diltiazem. He has not been taking eplerenone since 04/16/2021 as his blood pressure was typically well controlled at home (130s) and he did not believe it was making much of a difference. ? ?For activity he enjoys playing golf, 2-3 times a week. ? ?He denies any palpitations, chest pain, or peripheral edema. No headaches, syncope, orthopnea, or PND. ? ?  ? ?Past Medical History:  ?Diagnosis Date  ? Arthritis   ? Depression   ? Depression   ? Phreesia 01/03/2020  ? Hyperlipidemia   ? controlled with meds   ? Hypertension   ? NSTEMI (non-ST elevated myocardial infarction) ([place]) 04/25/2014  ? Postmenopausal   ? Substance abuse ([place])   ? ? ?Past Surgical History:  ?Procedure Laterality Date  ? A-FLUTTER ABLATION N/A 02/24/2021  ? Procedure: A-FLUTTER ABLATION;  Surgeon: Vickie Epley, MD;  Location: [place] [facility];  Service: Cardiovascular;  Laterality: N/A;  ? COLONOSCOPY    ? HERNIA REPAIR    ? LEFT HEART CATHETERIZATION WITH CORONARY ANGIOGRAM N/A 04/25/2014  ? Procedure: LEFT HEART CATHETERIZATION WITH CORONARY ANGIOGRAM;  Surgeon: Laverda Page, MD;   Location: [facility] CATH LAB;  Service: Cardiovascular;  Laterality: N/A; with stents placement   ? POLYPECTOMY    ? VASECTOMY    ? ? ?Current Medications: ?Current Meds  ?Medication Sig  ? acetaminophen (TYLENOL) 500 MG tablet Take 1,000 mg by mouth every 8 (eight) hours as needed for moderate pain.  ? atorvastatin (LIPITOR) 40 MG tablet Take 0.5 tablets (20 mg total) by mouth at bedtime.  ? valsartan (DIOVAN) 320 MG tablet TAKE 1 TABLET BY MOUTH EVERY DAY  ? [DISCONTINUED] apixaban (ELIQUIS) 5 MG TABS tablet Take 1 tablet (5 mg total) by mouth 2 (two) times daily.  ? [DISCONTINUED] diltiazem (CARDIZEM) 30 MG tablet TAKE 1 TABLET BY MOUTH 3 TIMES DAILY.  ?  ? ?Allergies:   Patient has no known allergies.  ? ?Social History  ? ?Socioeconomic History  ? Marital status: Married  ?  Spouse name: Not on file  ? Number of children: 2  ? Years of education: Not on file  ? Highest education level: Not on file  ?Occupational History  ? Not on file  ?Tobacco Use  ? Smoking status: Former  ?  Packs/day: 0.50  ?  Years: 10.00  ?  Pack years: 5.00  ?  Types: Cigarettes  ?  Quit date: 04/12/1982  ?  Years since quitting: 39.1  ? Smokeless tobacco: Never  ?Vaping Use  ? Vaping Use: Never used  ?Substance and Sexual Activity  ? Alcohol use: No  ?  Alcohol/week: 0.0 standard  drinks  ? Drug use: No  ? Sexual activity: Yes  ?Other Topics Concern  ? Not on file  ?Social History Narrative  ? Not on file  ? ?Social Determinants of Health  ? ?Financial Resource Strain: Not on file  ?Food Insecurity: Not on file  ?Transportation Needs: Not on file  ?Physical Activity: Not on file  ?Stress: Not on file  ?Social Connections: Not on file  ?  ? ?Family History: ?The patient's family history includes Alcohol abuse in his maternal grandfather; Alzheimer's disease in his father; Cancer in his maternal grandfather, mother, and paternal grandfather; Heart disease in his brother and sister; Hypertension in his father; Stroke in his maternal grandmother and  paternal grandmother. There is no history of Colon cancer, Colon polyps, Esophageal cancer, Rectal cancer, or Stomach cancer. ? ?ROS:   ?Please see the history of present illness.    ?All other systems reviewed and are negative. ? ?EKGs/Labs/Other Studies Reviewed:   ? ?The following studies were reviewed today: ? ?A-Flutter Ablation 02/24/2021: ? CONCLUSIONS:  ? 1. Isthmus-dependent counter clockwise right atrial flutter.  ? 2. Successful radiofrequency ablation of atrial flutter along the cavotricuspid isthmus with complete bidirectional isthmus block achieved.  ? 3. No inducible arrhythmias following ablation.  ? 4. No early apparent complications.  ? ? ?September 18, 2020 echo (PCV study) ?Left ventricle cavity is normal in size and wall thickness. Normal global  ?wall motion. Normal LV systolic function with EF 66%. Doppler evidence of  ?grade I (impaired) diastolic dysfunction, normal LAP. No significant  ?valvular abnormality.  ?Previous study in 2018 had noted normal diastolic function. ?  ?September 24, 2020 heart monitor (PCV study) ?Ambulatory cardiac telemetry 08/27/2020 - 09/10/2020: ?Predominant underlying rhythm was sinus with first-degree AV block as well as bundle branch block.  Minimum heart rate 43 bpm, maximum heart rate 175 bpm, average heart rate 75 bpm.  Patient had 7 episodes of supraventricular tachycardia longest lasting 19 beats.  PACs and PVCs were rare.  Patient's symptoms correlated with sinus rhythm and PACs. No evidence of atrial fibrillation, high degree AV block, ventricular tachycardia, or pause >3 seconds.  ?  ?January 15, 2021 EKG shows typical appearing atrial flutter with variable AV conduction, right bundle branch block ?  ?All EKGs reviewed in our system.  No evidence of atrial fibrillation.  ? ?EKG:  EKG is personally reviewed.  ?05/28/2021: EKG was not ordered. ?04/16/2021: Sinus rhythm at a rate of 62 bpm. ? ?Recent Labs: ?12/19/2020: B Natriuretic Peptide 135.2; Magnesium 1.9 ?04/02/2021:  ALT 21; BUN 16; Creatinine, Ser 1.07; Hemoglobin 14.0; Platelets 208.0; Potassium 4.9; Sodium 141  ? ?Recent Lipid Panel ?   ?Component Value Date/Time  ? CHOL 135 04/02/2021 1012  ? CHOL 131 01/04/2020 1641  ? TRIG 48.0 04/02/2021 1012  ? HDL 48.40 04/02/2021 1012  ? HDL 54 01/04/2020 1641  ? CHOLHDL 3 04/02/2021 1012  ? VLDL 9.6 04/02/2021 1012  ? LDLCALC 77 04/02/2021 1012  ? [place] 64 01/04/2020 1641  ? LDLDIRECT 69 02/15/2019 1042  ? ? ?Physical Exam:   ? ?VS:  BP 130/86   Pulse 86   Ht '5\' 10"'$  (1.778 m)   Wt 176 lb 14.4 oz (80.2 kg)   LMP  (LMP Unknown)   SpO2 98%   BMI 25.38 kg/m?    ? ?Wt Readings from Last 3 Encounters:  ?05/28/21 176 lb 14.4 oz (80.2 kg)  ?04/16/21 177 lb (80.3 kg)  ?04/02/21 174 lb (78.9 kg)  ?  ? ?  GEN: Well nourished, well developed in no acute distress ?HEENT: Normal ?NECK: No JVD; No carotid bruits ?LYMPHATICS: No lymphadenopathy ?CARDIAC: RRR, no murmurs, rubs, gallops ?RESPIRATORY:  Clear to auscultation without rales, wheezing or rhonchi  ?ABDOMEN: Soft, non-tender, non-distended ?MUSCULOSKELETAL:  No edema; No deformity  ?SKIN: Warm and dry ?NEUROLOGIC:  Alert and oriented x 3 ?PSYCHIATRIC:  Normal affect  ? ? ? ?  ? ?ASSESSMENT:   ? ?1. Typical atrial flutter [facility]) s/p ablation 02/2021   ?2. Essential hypertension   ?3. Coronary artery disease involving native coronary artery of native heart without angina pectoris   ? ?PLAN:   ? ?In order of problems listed above: ? ?#Typical atrial flutter ?Post ablation in December 2022 ?Doing well after the ablation without recurrence of his arrhythmia.  No evidence of atrial fibrillation.  We do long discussion today about the stroke risk associated with his atrial arrhythmias.  Given we have not had a recurrence of atrial flutter and no evidence of atrial fibrillation thus far, I think it is reasonable to stop his Eliquis at this time.  We talked about this at length during today's clinic visit.  I recommended that he continue to  monitor his heart rhythm on a daily basis using his Apple Watch.  If he has a recurrence of atrial flutter or develops atrial fibrillation, he should let us know immediately so we can discuss his risk of stroke and g

## 2021-06-18 ENCOUNTER — Ambulatory Visit: Payer: Federal, State, Local not specified - PPO | Admitting: Rheumatology

## 2021-07-14 ENCOUNTER — Ambulatory Visit: Payer: Federal, State, Local not specified - PPO | Admitting: Rheumatology

## 2021-10-06 ENCOUNTER — Encounter: Payer: Self-pay | Admitting: Cardiology

## 2021-10-15 ENCOUNTER — Ambulatory Visit: Payer: Federal, State, Local not specified - PPO | Admitting: Student

## 2021-10-15 ENCOUNTER — Ambulatory Visit: Payer: Federal, State, Local not specified - PPO | Admitting: Cardiology

## 2021-10-15 ENCOUNTER — Encounter: Payer: Self-pay | Admitting: Cardiology

## 2021-10-15 VITALS — BP 164/91 | HR 79 | Temp 98.0°F | Resp 16 | Ht 70.0 in | Wt 169.0 lb

## 2021-10-15 DIAGNOSIS — I2510 Atherosclerotic heart disease of native coronary artery without angina pectoris: Secondary | ICD-10-CM

## 2021-10-15 DIAGNOSIS — E7800 Pure hypercholesterolemia, unspecified: Secondary | ICD-10-CM

## 2021-10-15 DIAGNOSIS — I10 Essential (primary) hypertension: Secondary | ICD-10-CM

## 2021-10-15 MED ORDER — DILTIAZEM HCL ER COATED BEADS 120 MG PO CP24: 120.0000 mg | ORAL_CAPSULE | Freq: Every evening | ORAL | 3 refills | Status: DC

## 2021-10-15 MED ORDER — ASPIRIN 81 MG PO CHEW: 81.0000 mg | CHEWABLE_TABLET | Freq: Every day | ORAL | Status: AC

## 2021-10-15 MED ORDER — CHLORTHALIDONE 25 MG PO TABS: 25.0000 mg | ORAL_TABLET | ORAL | 3 refills | Status: DC

## 2021-10-15 NOTE — Progress Notes (Signed)
Primary Physician/Referring:  Horald Pollen, MD  Patient ID: Edwin Sevin., adult    DOB: 1945-11-05, 76 y.o.   MRN: [id number]  Chief Complaint  Patient presents with   Coronary Artery Disease   Atrial Flutter   Follow-up    6 month   HPI:    Edwin Mckee.  is a 76 y.o. Caucasian male with coronary artery disease with history of non-STEMI  04/25/2014 S/P stenting of the proximal and mid LAD and balloon angioplasty of the D1, mild diffuse disease in other vessels, LVEF 50-55%. He also has hypertension, hyperglycemia and mild hyperlipidemia.  Due to typical atrial flutter, underwent atrial flutter ablation on 02/24/2021.  His anticoagulation has been discontinued since March 2023 by EP.  He now presents for 74-monthoffice visit.  He is feeling the best in quite a while and has not had any further palpitations since ablation.  He has not had any recurrence of angina pectoris.  He has noticed his blood pressure to be elevated since discontinuing diltiazem and chlorthalidone.  Past Medical History:  Diagnosis Date   Arthritis    Depression    Depression    Phreesia 01/03/2020   Hyperlipidemia    controlled with meds    Hypertension    NSTEMI (non-ST elevated myocardial infarction) [place] 04/25/2014   Postmenopausal    Substance abuse ([facility]    Past Surgical History:  Procedure Laterality Date   A-FLUTTER ABLATION N/A 02/24/2021   Procedure: A-FLUTTER ABLATION;  Surgeon: LVickie Epley MD;  [facility];  Service: Cardiovascular;  Laterality: N/A;   COLONOSCOPY     HERNIA REPAIR     LEFT HEART CATHETERIZATION WITH CORONARY ANGIOGRAM N/A 04/25/2014   Procedure: LEFT HEART CATHETERIZATION WITH CORONARY ANGIOGRAM;  Surgeon: JLaverda Page MD;  [facility];  Service: Cardiovascular;  Laterality: N/A; with stents placement    POLYPECTOMY     VASECTOMY     Social History   Tobacco Use   Smoking status: Former    Packs/day: 0.50     Years: 10.00    Total pack years: 5.00    Types: Cigarettes    Quit date: 04/12/1982    Years since quitting: 39.5   Smokeless tobacco: Never  Substance Use Topics   Alcohol use: No    Alcohol/week: 0.0 standard drinks of alcohol  Marital Status: Married   ROS  Review of Systems  Cardiovascular:  Negative for chest pain, dyspnea on exertion and leg swelling.   Objective      10/15/2021    3:28 PM 05/28/2021    1:50 PM 04/16/2021    8:58 AM  Vitals with BMI  Height '5\' 10"'$  '5\' 10"'$  '5\' 10"'$   Weight 169 lbs 176 lbs 14 oz 177 lbs  BMI 24.25 255.73222.0 Systolic [id number] Diastolic 91 86 78  Pulse 79 86 67    Orthostatic VS for the past 72 hrs (Last 3 readings):  Patient Position BP Location Cuff Size  10/15/21 1528 Sitting Left Arm Normal    Physical Exam Neck:     Vascular: No carotid bruit or JVD.  Cardiovascular:     Rate and Rhythm: Normal rate and regular rhythm.     Pulses: Normal pulses and intact distal pulses.     Heart sounds:     No gallop.  Pulmonary:     Effort: Pulmonary effort is normal.     Breath sounds: Normal  breath sounds.  Abdominal:     General: Bowel sounds are normal.     Palpations: Abdomen is soft.  Musculoskeletal:     Right lower leg: No edema.     Left lower leg: No edema.    Laboratory examination:   Recent Labs    12/19/20 1541 02/17/21 0941 04/02/21 1012  NA 138 142 141  K 4.7 4.0 4.9  CL 107 109* 106  CO2 '22 26 28  '$ GLUCOSE 91 96 119*  BUN '21 16 16  '$ CREATININE 1.31* 1.15 1.07  CALCIUM 8.8* 9.2 9.5  GFRNONAA 58*  --   --       Latest Ref Rng & Units 04/02/2021   10:12 AM 02/17/2021    9:41 AM 12/19/2020    3:41 PM  CMP  Glucose 70 - 99 mg/dL 119  96  91   BUN 6 - 23 mg/dL '16  16  21   '$ Creatinine 0.40 - 1.50 mg/dL 1.07  1.15  1.31   Sodium 135 - 145 mEq/L 141  142  138   Potassium 3.5 - 5.1 mEq/L 4.9  4.0  4.7   Chloride 96 - 112 mEq/L 106  109  107   CO2 19 - 32 mEq/L '28  26  22   '$ Calcium 8.4 - 10.5 mg/dL 9.5  9.2   8.8   Total Protein 6.0 - 8.3 g/dL 6.6     Total Bilirubin 0.2 - 1.2 mg/dL 0.5     Alkaline Phos 39 - 117 U/L 60     AST 0 - 37 U/L 22     ALT 0 - 53 U/L 21         Latest Ref Rng & Units 04/02/2021   10:12 AM 02/17/2021    9:41 AM 12/19/2020    3:41 PM  CBC  WBC 4.0 - 10.5 K/uL 6.3  7.4  7.8   Hemoglobin 13.0 - 17.0 g/dL 14.0  12.7  13.1   Hematocrit 39.0 - 52.0 % 43.3  37.7  40.5   Platelets 150.0 - 400.0 K/uL 208.0  231  182    Lipid Panel Recent Labs    04/02/21 1012  CHOL 135  TRIG 48.0  LDLCALC 77  VLDL 9.6  HDL 48.40  CHOLHDL 3     HEMOGLOBIN A1C Lab Results  Component Value Date   HGBA1C 6.2 04/02/2021   TSH No results for input(s): "TSH" in the last 8760 hours.  Allergies  No Known Allergies   Final Medications at End of Visit     Current Outpatient Medications:    acetaminophen (TYLENOL) 500 MG tablet, Take 1,000 mg by mouth every 8 (eight) hours as needed for moderate pain., Disp: , Rfl:    aspirin (ASPIRIN CHILDRENS) 81 MG chewable tablet, Chew 1 tablet (81 mg total) by mouth daily., Disp: , Rfl:    atorvastatin (LIPITOR) 40 MG tablet, Take 0.5 tablets (20 mg total) by mouth at bedtime., Disp: 45 tablet, Rfl: 3   chlorthalidone (HYGROTON) 25 MG tablet, Take 1 tablet (25 mg total) by mouth every morning., Disp: 90 tablet, Rfl: 3   diltiazem (CARDIZEM CD) 120 MG 24 hr capsule, Take 1 capsule (120 mg total) by mouth every evening., Disp: 90 capsule, Rfl: 3   valsartan (DIOVAN) 320 MG tablet, TAKE 1 TABLET BY MOUTH EVERY DAY, Disp: 90 tablet, Rfl: 3  Radiology:  No results found.  Cardiac Studies:   Coronary Angiography 04/25/2014: PTCA and stenting of the proximal  and mid LAD with implantation of a 3.0 x 38 mm resolute DES and balloon angioplasty of the D1 with 2.5 x 15 mm balloon. Mild disease otherwise. LVEF 50-55%  Exercise sestamibi stress test 07/20/2016: 1. The resting electrocardiogram demonstrated normal sinus rhythm, RBBB and no resting  arrhythmias.  The stress electrocardiogram was normal.  Occasional PVC. Patient exercised on Bruce protocol for 7:30 minutes and achieved 9.34 METS. Stress test terminated due to fatigue and 95% MPHR achieved (Target HR >85%).  2. The LV is dilated both at rest and stress images. The LV end diastolic volume was  [id number]. REST and STRESS images demonstrate decreased tracer uptake in the mid inferoseptal and apical septal segments of the left ventricle.  These defects are related to diaphragmatic attenuation.  The left ventricular ejection fraction was calculated to be 48% bu visually appears to be normal.  This is a low risk study.  PCV ECHOCARDIOGRAM COMPLETE 09/18/2020 Left ventricle cavity is normal in size and wall thickness. Normal global wall motion. Normal LV systolic function with EF 66%. Doppler evidence of grade I (impaired) diastolic dysfunction, normal LAP. No significant valvular abnormality. Previous study in 2018 had noted normal diastolic function.   Ambulatory cardiac telemetry 08/27/2020 - 09/10/2020: Predominant underlying rhythm was sinus with first-degree AV block and bundle branch block.  Heart rate 43-175 bpm, average heart rate 75 bpm.  7 episodes of supraventricular tachycardia longest lasting 19 beats.  PACs and PVCs were rare.  Patient's symptoms correlated with sinus rhythm and PACs.  No evidence of A. fib, high degree AV block, VT, or pauses >3 seconds.  Atrial flutter ablation 02/24/2021:  1. Isthmus-dependent counter clockwise right atrial flutter.   2. Successful radiofrequency ablation of atrial flutter along the cavotricuspid isthmus with complete bidirectional isthmus block achieved.   3. No inducible arrhythmias following ablation.   4. No early apparent complications.   EKG:    EKG 10/15/2021: Sinus rhythm with first-degree block at rate of 68 bpm, normal axis, right bundle branch block.  No significant change from 04/16/2021.    Assessment     ICD-10-CM   1.  Coronary artery disease involving native coronary artery of native heart without angina pectoris  I25.10 EKG 12-Lead    aspirin (ASPIRIN CHILDRENS) 81 MG chewable tablet    2. Essential hypertension  I10 chlorthalidone (HYGROTON) 25 MG tablet    diltiazem (CARDIZEM CD) 120 MG 24 hr capsule    Basic metabolic panel    3. Hypercholesteremia  E78.00      Meds ordered this encounter  Medications   chlorthalidone (HYGROTON) 25 MG tablet    Sig: Take 1 tablet (25 mg total) by mouth every morning.    Dispense:  90 tablet    Refill:  3   diltiazem (CARDIZEM CD) 120 MG 24 hr capsule    Sig: Take 1 capsule (120 mg total) by mouth every evening.    Dispense:  90 capsule    Refill:  3   aspirin (ASPIRIN CHILDRENS) 81 MG chewable tablet    Sig: Chew 1 tablet (81 mg total) by mouth daily.   Medications Discontinued During This Encounter  Medication Reason   diltiazem (CARDIZEM) 30 MG tablet Dose change     Recommendations:   Edwin Mckee.  is a 76 y.o. Caucasian male with coronary artery disease with history of non-STEMI  04/25/2014 S/P stenting of the proximal and mid LAD and balloon angioplasty of the D1, mild diffuse disease in other  vessels, LVEF 50-55%. He also has hypertension, hyperglycemia and mild hyperlipidemia.  Due to typical atrial flutter, underwent atrial flutter ablation on 02/24/2021.  His anticoagulation has been discontinued since March 2023 by EP.  He now presents for 24-monthoffice visit.  He is feeling the best in quite a while and has not had any further palpitations since ablation.  He has not had any recurrence of angina pectoris.  However states that since diltiazem and diuretic chlorthalidone was discontinued, he has noticed his blood pressure to be elevated.  He self started taking diltiazem 30 mg 3 times daily recently.  I discontinued this and switch him to long-acting diltiazem CD1 20 mg daily in the evening, restarted chlorthalidone 25 mg in the morning.  Will  obtain BMP in 2 to 3 weeks.  He would like to see me back in 6 weeks for follow-up of hypertension.  Otherwise he is presently doing well, I have restarted aspirin in view of underlying coronary artery disease.    JAdrian Prows MD, [facility]8/04/2021, 11:01 PM Office: [phone]: [phone]: [phone]

## 2021-10-22 ENCOUNTER — Telehealth: Payer: Self-pay

## 2021-10-22 NOTE — Telephone Encounter (Signed)
Edwin Mckee idea

## 2021-10-22 NOTE — Telephone Encounter (Signed)
Patient called and stated that he was recently started on a anew BP medication Chlorthalidone. He is "not doing well", even if it has brought his BP down, his hips,lower back and thighs are "cramping so bad that he can hardly walk." He did cut the Chlorthalidone dose in 1/2 and will try that for a few days,

## 2021-10-23 NOTE — Telephone Encounter (Signed)
Spoke with patient, and he stated that because the cramping had not completely gone away,he had not taken it for a 4 days now and he feels better than he has ever felt before and his BP has been good.

## 2021-11-11 LAB — BASIC METABOLIC PANEL
BUN/Creatinine Ratio: 14 (ref 10–24)
BUN: 16 mg/dL (ref 8–27)
CO2: 26 mmol/L (ref 20–29)
Calcium: 9.7 mg/dL (ref 8.6–10.2)
Chloride: 102 mmol/L (ref 96–106)
Creatinine, Ser: 1.18 mg/dL (ref 0.76–1.27)
Glucose: 91 mg/dL (ref 70–99)
Potassium: 4.5 mmol/L (ref 3.5–5.2)
Sodium: 140 mmol/L (ref 134–144)
eGFR: 65 mL/min/{1.73_m2} (ref >59)

## 2021-11-26 ENCOUNTER — Encounter: Payer: Self-pay | Admitting: Cardiology

## 2021-11-26 ENCOUNTER — Ambulatory Visit: Payer: Federal, State, Local not specified - PPO | Admitting: Cardiology

## 2021-11-26 VITALS — BP 130/74 | HR 60 | Temp 97.2°F | Resp 16 | Ht 70.0 in | Wt 162.6 lb

## 2021-11-26 DIAGNOSIS — E7800 Pure hypercholesterolemia, unspecified: Secondary | ICD-10-CM

## 2021-11-26 DIAGNOSIS — I2510 Atherosclerotic heart disease of native coronary artery without angina pectoris: Secondary | ICD-10-CM

## 2021-11-26 DIAGNOSIS — I10 Essential (primary) hypertension: Secondary | ICD-10-CM

## 2021-11-26 MED ORDER — ATORVASTATIN CALCIUM 40 MG PO TABS: 20.0000 mg | ORAL_TABLET | Freq: Every day | ORAL | 3 refills | Status: DC

## 2021-11-26 NOTE — Progress Notes (Signed)
Primary Physician/Referring:  Horald Pollen, MD  Patient ID: Edwin Mckee., adult    DOB: 06-07-1945, 76 y.o.   MRN: [id number]  Chief Complaint  Patient presents with   Hypertension   Follow-up    6 weeks   HPI:    Edwin Mckee.  is a 76 y.o. Caucasian male with coronary artery disease with history of non-STEMI  04/25/2014 S/P stenting of the proximal and mid LAD and balloon angioplasty of the D1, mild diffuse disease in other vessels, LVEF 50-55%. He also has hypertension, hyperglycemia and mild hyperlipidemia.  Due to typical atrial flutter, underwent atrial flutter ablation on 02/24/2021.  His anticoagulation has been discontinued since March 2023 by EP.  On his last office visit due to elevated blood pressure, I restarted chlorthalidone and diltiazem, states that he is feeling the best he has in quite a while and noticed stable blood pressure since being on the above medication but due to low blood pressure.  He has been taking 12.5 mg of chlorthalidone.   Past Medical History:  Diagnosis Date   Arthritis    Depression    Depression    Phreesia 01/03/2020   Hyperlipidemia    controlled with meds    Hypertension    NSTEMI (non-ST elevated myocardial infarction) ([place]) 04/25/2014   Postmenopausal    Substance abuse ([place])    Past Surgical History:  Procedure Laterality Date   A-FLUTTER ABLATION N/A 02/24/2021   Procedure: A-FLUTTER ABLATION;  Surgeon: Vickie Epley, MD;  Location: [facility];  Service: Cardiovascular;  Laterality: N/A;   COLONOSCOPY     HERNIA REPAIR     LEFT HEART CATHETERIZATION WITH CORONARY ANGIOGRAM N/A 04/25/2014   Procedure: LEFT HEART CATHETERIZATION WITH CORONARY ANGIOGRAM;  Surgeon: Laverda Page, MD;  Location: [facility] CATH LAB;  Service: Cardiovascular;  Laterality: N/A; with stents placement    POLYPECTOMY     VASECTOMY     Social History   Tobacco Use   Smoking status: Former    Packs/day: 0.50    Years: 10.00     Total pack years: 5.00    Types: Cigarettes    Quit date: 04/12/1982    Years since quitting: 39.6   Smokeless tobacco: Never  Substance Use Topics   Alcohol use: No    Alcohol/week: 0.0 standard drinks of alcohol  Marital Status: Married   ROS  Review of Systems  Cardiovascular:  Negative for chest pain, dyspnea on exertion and leg swelling.   Objective      11/26/2021    1:58 PM 10/15/2021    3:28 PM 05/28/2021    1:50 PM  Vitals with BMI  Height 5' 10"  5' 10"  5' 10"   Weight 162 lbs 10 oz 169 lbs 176 lbs 14 oz  BMI 23.33 01.77 93.90  Systolic 300 923 300  Diastolic 74 91 86  Pulse 60 79 86    No data found.   Physical Exam Neck:     Vascular: No carotid bruit or JVD.  Cardiovascular:     Rate and Rhythm: Normal rate and regular rhythm.     Pulses: Normal pulses and intact distal pulses.     Heart sounds:     No gallop.  Pulmonary:     Effort: Pulmonary effort is normal.     Breath sounds: Normal breath sounds.  Abdominal:     General: Bowel sounds are normal.     Palpations: Abdomen is soft.  Musculoskeletal:     Right lower leg: No edema.     Left lower leg: No edema.    Laboratory examination:   Recent Labs    12/19/20 1541 02/17/21 0941 04/02/21 1012 11/10/21 1358  NA 138 142 141 140  K 4.7 4.0 4.9 4.5  CL 107 109* 106 102  CO2 22 26 28 26   GLUCOSE 91 96 119* 91  BUN 21 16 16 16   CREATININE 1.31* 1.15 1.07 1.18  CALCIUM 8.8* 9.2 9.5 9.7  GFRNONAA 58*  --   --   --       Latest Ref Rng & Units 11/10/2021    1:58 PM 04/02/2021   10:12 AM 02/17/2021    9:41 AM  CMP  Glucose 70 - 99 mg/dL 91  119  96   BUN 8 - 27 mg/dL 16  16  16    Creatinine 0.76 - 1.27 mg/dL 1.18  1.07  1.15   Sodium 134 - 144 mmol/L 140  141  142   Potassium 3.5 - 5.2 mmol/L 4.5  4.9  4.0   Chloride 96 - 106 mmol/L 102  106  109   CO2 20 - 29 mmol/L 26  28  26    Calcium 8.6 - 10.2 mg/dL 9.7  9.5  9.2   Total Protein 6.0 - 8.3 g/dL  6.6    Total Bilirubin 0.2 - 1.2  mg/dL  0.5    Alkaline Phos 39 - 117 U/L  60    AST 0 - 37 U/L  22    ALT 0 - 53 U/L  21        Latest Ref Rng & Units 04/02/2021   10:12 AM 02/17/2021    9:41 AM 12/19/2020    3:41 PM  CBC  WBC 4.0 - 10.5 K/uL 6.3  7.4  7.8   Hemoglobin 13.0 - 17.0 g/dL 14.0  12.7  13.1   Hematocrit 39.0 - 52.0 % 43.3  37.7  40.5   Platelets 150.0 - 400.0 K/uL 208.0  231  182    Lipid Panel Recent Labs    04/02/21 1012  CHOL 135  TRIG 48.0  LDLCALC 77  VLDL 9.6  HDL 48.40  CHOLHDL 3     HEMOGLOBIN A1C Lab Results  Component Value Date   HGBA1C 6.2 04/02/2021   TSH No results for input(s): "TSH" in the last 8760 hours.  Allergies  No Known Allergies   Final Medications at End of Visit     Current Outpatient Medications:    acetaminophen (TYLENOL) 500 MG tablet, Take 1,000 mg by mouth every 8 (eight) hours as needed for moderate pain., Disp: , Rfl:    aspirin (ASPIRIN CHILDRENS) 81 MG chewable tablet, Chew 1 tablet (81 mg total) by mouth daily., Disp: , Rfl:    diltiazem (CARDIZEM CD) 120 MG 24 hr capsule, Take 1 capsule (120 mg total) by mouth every evening., Disp: 90 capsule, Rfl: 3   atorvastatin (LIPITOR) 40 MG tablet, Take 0.5 tablets (20 mg total) by mouth at bedtime., Disp: 90 tablet, Rfl: 3   chlorthalidone (HYGROTON) 25 MG tablet, Take 1 tablet (25 mg total) by mouth every morning., Disp: 90 tablet, Rfl: 3   valsartan (DIOVAN) 320 MG tablet, Take 1 tablet (320 mg total) by mouth daily., Disp: 90 tablet, Rfl: 3  Radiology:  No results found.  Cardiac Studies:   Coronary Angiography 04/25/2014: PTCA and stenting of the proximal and mid LAD with implantation of a  3.0 x 38 mm resolute DES and balloon angioplasty of the D1 with 2.5 x 15 mm balloon. Mild disease otherwise. LVEF 50-55%  Exercise sestamibi stress test 07/20/2016: 1. The resting electrocardiogram demonstrated normal sinus rhythm, RBBB and no resting arrhythmias.  The stress electrocardiogram was normal.  Occasional  PVC. Patient exercised on Bruce protocol for 7:30 minutes and achieved 9.34 METS. Stress test terminated due to fatigue and 95% MPHR achieved (Target HR >85%).  2. The LV is dilated both at rest and stress images. The LV end diastolic volume was  540JW. REST and STRESS images demonstrate decreased tracer uptake in the mid inferoseptal and apical septal segments of the left ventricle.  These defects are related to diaphragmatic attenuation.  The left ventricular ejection fraction was calculated to be 48% bu visually appears to be normal.  This is a low risk study.  PCV ECHOCARDIOGRAM COMPLETE 09/18/2020 Left ventricle cavity is normal in size and wall thickness. Normal global wall motion. Normal LV systolic function with EF 66%. Doppler evidence of grade I (impaired) diastolic dysfunction, normal LAP. No significant valvular abnormality. Previous study in 2018 had noted normal diastolic function.   Ambulatory cardiac telemetry 08/27/2020 - 09/10/2020: Predominant underlying rhythm was sinus with first-degree AV block and bundle branch block.  Heart rate 43-175 bpm, average heart rate 75 bpm.  7 episodes of supraventricular tachycardia longest lasting 19 beats.  PACs and PVCs were rare.  Patient's symptoms correlated with sinus rhythm and PACs.  No evidence of A. fib, high degree AV block, VT, or pauses >3 seconds.  Atrial flutter ablation 02/24/2021:  1. Isthmus-dependent counter clockwise right atrial flutter.   2. Successful radiofrequency ablation of atrial flutter along the cavotricuspid isthmus with complete bidirectional isthmus block achieved.   3. No inducible arrhythmias following ablation.   4. No early apparent complications.   EKG:    EKG 10/15/2021: Sinus rhythm with first-degree block at rate of 68 bpm, normal axis, right bundle branch block.  No significant change from 04/16/2021.    Assessment     ICD-10-CM   1. Primary hypertension  I10 chlorthalidone (HYGROTON) 25 MG tablet     valsartan (DIOVAN) 320 MG tablet    Basic metabolic panel    2. Coronary artery disease involving native coronary artery of native heart without angina pectoris  I25.10 atorvastatin (LIPITOR) 40 MG tablet    3. Hypercholesteremia  E78.00 atorvastatin (LIPITOR) 40 MG tablet     Meds ordered this encounter  Medications   atorvastatin (LIPITOR) 40 MG tablet    Sig: Take 0.5 tablets (20 mg total) by mouth at bedtime.    Dispense:  90 tablet    Refill:  3   Recommendations:   Raymel Cull.  is a 76 y.o. Caucasian male with coronary artery disease with history of non-STEMI  04/25/2014 S/P stenting of the proximal and mid LAD and balloon angioplasty of the D1, mild diffuse disease in other vessels, LVEF 50-55%. He also has hypertension, hyperglycemia and mild hyperlipidemia.  Due to typical atrial flutter, underwent atrial flutter ablation on 02/24/2021.  His anticoagulation has been discontinued since March 2023 by EP.  On his last office visit due to elevated blood pressure, I restarted chlorthalidone and diltiazem, states that he is feeling the best he has in quite a while and noticed stable blood pressure since being on the above medication but due to low blood pressure.  He has been taking 12.5 mg of chlorthalidone.   His serum  creatinine had increased since being on chlorthalidone 12.5 mg, would like to repeat this in the next 2 to 3 weeks.  I wanted to discontinue this and switch him back to hydrochlorothiazide but states that no other medications has helped him to improve his blood pressure control, hence he would prefer to continue chlorthalidone for now.  1 option would be reducing the dose of the Diovan to 160 mg from 320 mg and increasing chlorthalidone to 25 mg daily to see whether that would make any difference in serum creatinine and EGFR if need be.  With regard to coronary artery disease and hyperlipidemia, he has not had any recurrence of angina and lipids are well controlled.   Continue 20 mg of Lipitor daily.   Adrian Prows, MD, [facility] 11/30/2021, 11:25 AM Office: [phone] Fax: [phone] Pager: [phone]

## 2021-11-30 ENCOUNTER — Encounter: Payer: Self-pay | Admitting: Cardiology

## 2021-12-17 ENCOUNTER — Ambulatory Visit: Payer: Federal, State, Local not specified - PPO | Admitting: Podiatry

## 2021-12-17 ENCOUNTER — Encounter: Payer: Self-pay | Admitting: Podiatry

## 2021-12-17 DIAGNOSIS — M7751 Other enthesopathy of right foot: Secondary | ICD-10-CM

## 2021-12-17 MED ORDER — TRIAMCINOLONE ACETONIDE 10 MG/ML IJ SUSP
10.0000 mg | Freq: Once | INTRAMUSCULAR | Status: AC
  Administered 2021-12-17: 10 mg

## 2021-12-18 NOTE — Progress Notes (Signed)
Subjective:   Patient ID: Edwin Sevin., adult   DOB: 76 y.o.   MRN: [id number]   HPI Patient states he has pain near the right first metatarsal and that he also gets a lesion associated with it but its mostly fluid that gets sore   ROS      Objective:  Physical Exam  Neuro vascular status intact with inflammation around the head of the first metatarsal with fluid buildup     Assessment:  Plantar inflammatory capsulitis of the right plantar first MPJ with pain     Plan:  Reviewed condition sterile prep injected the plantar capsule 3 mg dexamethasone Kenalog 5 mg Xylocaine advised on ice and debrided tissue reappoint as needed may require more aggressive approach

## 2022-04-08 ENCOUNTER — Encounter: Payer: Self-pay | Admitting: Emergency Medicine

## 2022-04-08 ENCOUNTER — Ambulatory Visit (INDEPENDENT_AMBULATORY_CARE_PROVIDER_SITE_OTHER): Payer: Federal, State, Local not specified - PPO | Admitting: Emergency Medicine

## 2022-04-08 VITALS — BP 130/70 | HR 54 | Temp 98.1°F | Ht 70.0 in | Wt 165.1 lb

## 2022-04-08 DIAGNOSIS — Z0000 Encounter for general adult medical examination without abnormal findings: Secondary | ICD-10-CM

## 2022-04-08 DIAGNOSIS — Z125 Encounter for screening for malignant neoplasm of prostate: Secondary | ICD-10-CM

## 2022-04-08 DIAGNOSIS — I2510 Atherosclerotic heart disease of native coronary artery without angina pectoris: Secondary | ICD-10-CM | POA: Diagnosis not present

## 2022-04-08 DIAGNOSIS — Z130 Encounter for screening for diseases of the blood and blood-forming organs and certain disorders involving the immune mechanism: Secondary | ICD-10-CM

## 2022-04-08 DIAGNOSIS — Z1329 Encounter for screening for other suspected endocrine disorder: Secondary | ICD-10-CM | POA: Diagnosis not present

## 2022-04-08 DIAGNOSIS — Z13228 Encounter for screening for other metabolic disorders: Secondary | ICD-10-CM | POA: Diagnosis not present

## 2022-04-08 DIAGNOSIS — I10 Essential (primary) hypertension: Secondary | ICD-10-CM | POA: Diagnosis not present

## 2022-04-08 DIAGNOSIS — Z13220 Encounter for screening for lipoid disorders: Secondary | ICD-10-CM | POA: Diagnosis not present

## 2022-04-08 NOTE — Assessment & Plan Note (Signed)
Well-controlled hypertension with normal blood pressure readings at home. Continue valsartan 320 mg, diltiazem 120 mg daily Recently started on Hygroton 25 mg daily Cardiovascular risks associated with hypertension discussed Dietary approaches to stop hypertension discussed.

## 2022-04-08 NOTE — Progress Notes (Signed)
Edwin Mckee. 77 y.o.   Chief Complaint  Patient presents with   Annual Exam    No concerns     HISTORY OF PRESENT ILLNESS: This is a 77 y.o. adult here for annual examination. Has no complaints or medical concerns today. History of hypertension with normal blood pressure readings at home. Past history of A-fib status post cardiac ablation procedure last year.  On daily aspirin.  No longer on Eliquis. No history of diabetes.  On cholesterol medication.   HPI   Prior to Admission medications   Medication Sig Start Date End Date Taking? Authorizing Provider  acetaminophen (TYLENOL) 500 MG tablet Take 1,000 mg by mouth every 8 (eight) hours as needed for moderate pain.   Yes [provider]  aspirin (ASPIRIN CHILDRENS) 81 MG chewable tablet Chew 1 tablet (81 mg total) by mouth daily. 10/15/21  Yes Adrian Prows, MD  atorvastatin (LIPITOR) 40 MG tablet Take 0.5 tablets (20 mg total) by mouth at bedtime. 11/26/21  Yes Adrian Prows, MD  chlorthalidone (HYGROTON) 25 MG tablet Take 1 tablet (25 mg total) by mouth every morning. 11/26/21  Yes Adrian Prows, MD  diltiazem (CARDIZEM CD) 120 MG 24 hr capsule Take 1 capsule (120 mg total) by mouth every evening. 10/15/21 10/10/22 Yes Adrian Prows, MD  valsartan (DIOVAN) 320 MG tablet Take 1 tablet (320 mg total) by mouth daily. 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   Atrial flutter with rapid ventricular response ([place])    Long term (current) use of anticoagulants    Benign hypertension    Mixed hyperlipidemia    Atherosclerosis of native coronary artery of native heart without angina pectoris    History of coronary artery stent placement    IHD (ischemic heart disease) 12/16/2016   History of MI (myocardial infarction) 12/16/2016   History of depression 12/16/2016   Hearing loss 05/04/2012   HYPERLIPIDEMIA 01/26/2008   Essential  hypertension 01/26/2008   INGUINAL HERNIA, HX OF 01/26/2008    Past Medical History:  Diagnosis Date   Arthritis    Depression    Depression    Phreesia 01/03/2020   Hyperlipidemia    controlled with meds    Hypertension    NSTEMI (non-ST elevated myocardial infarction) ([place]) 04/25/2014   Postmenopausal    Substance abuse ([place])     Past Surgical History:  Procedure Laterality Date   A-FLUTTER ABLATION N/A 02/24/2021   Procedure: A-FLUTTER ABLATION;  Surgeon: Vickie Epley, MD;  Location: [facility];  Service: Cardiovascular;  Laterality: N/A;   COLONOSCOPY     HERNIA REPAIR     LEFT HEART CATHETERIZATION WITH CORONARY ANGIOGRAM N/A 04/25/2014   Procedure: LEFT HEART CATHETERIZATION WITH CORONARY ANGIOGRAM;  Surgeon: Laverda Page, MD;  Location[facility];  Service: Cardiovascular;  Laterality: N/A; with stents placement    POLYPECTOMY     VASECTOMY      Social History   Socioeconomic History   Marital status: Married    Spouse name: Not on file   Number of children: 2   Years of education: Not on file   Highest education level: Not on file  Occupational History   Not on file  Tobacco Use   Smoking status: Former    Packs/day: 0.50    Years: 10.00    Total pack years: 5.00  Types: Cigarettes    Quit date: 04/12/1982    Years since quitting: 40.0   Smokeless tobacco: Never  Vaping Use   Vaping Use: Never used  Substance and Sexual Activity   Alcohol use: No    Alcohol/week: 0.0 standard drinks of alcohol   Drug use: No   Sexual activity: Yes  Other Topics Concern   Not on file  Social History Narrative   Not on file   Social Determinants of Health   Financial Resource Strain: Not on file  Food Insecurity: Not on file  Transportation Needs: Not on file  Physical Activity: Not on file  Stress: Not on file  Social Connections: Not on file  Intimate Partner Violence: Not on file    Family History  Problem Relation Age of Onset    Cancer Mother        lung   Hypertension Father    Alzheimer's disease Father    Heart disease Sister    Heart disease Brother    Stroke Maternal Grandmother    Alcohol abuse Maternal Grandfather    Cancer Maternal Grandfather    Stroke Paternal Grandmother    Cancer Paternal Grandfather    Colon cancer Neg Hx    Colon polyps Neg Hx    Esophageal cancer Neg Hx    Rectal cancer Neg Hx    Stomach cancer Neg Hx      Review of Systems  Constitutional: Negative.  Negative for chills and fever.  HENT: Negative.  Negative for congestion and sore throat.   Respiratory: Negative.  Negative for cough and shortness of breath.   Cardiovascular: Negative.  Negative for chest pain and palpitations.  Gastrointestinal:  Negative for abdominal pain, nausea and vomiting.  Genitourinary: Negative.   Musculoskeletal: Negative.   Skin: Negative.  Negative for rash.  Neurological:  Negative for dizziness and headaches.  All other systems reviewed and are negative.   Today's Vitals   04/08/22 0902  BP: (!) 144/82  Pulse: (!) 54  Temp: 98.1 F (36.7 C)  TempSrc: Oral  SpO2: 98%  Weight: 165 lb 2 oz (74.9 kg)  Height: '5\' 10"'$  (1.778 m)   Body mass index is 23.69 kg/m. Wt Readings from Last 3 Encounters:  04/08/22 165 lb 2 oz (74.9 kg)  11/26/21 162 lb 9.6 oz (73.8 kg)  10/15/21 169 lb (76.7 kg)    Physical Exam Vitals reviewed.  Constitutional:      Appearance: Normal appearance.  HENT:     Head: Normocephalic.     Comments: Bilateral hearing aids    Mouth/Throat:     Mouth: Mucous membranes are moist.     Pharynx: Oropharynx is clear.  Eyes:     Extraocular Movements: Extraocular movements intact.     Conjunctiva/sclera: Conjunctivae normal.     Pupils: Pupils are equal, round, and reactive to light.  Cardiovascular:     Rate and Rhythm: Normal rate and regular rhythm.     Pulses: Normal pulses.     Heart sounds: Normal heart sounds.  Pulmonary:     Effort: Pulmonary  effort is normal.     Breath sounds: Normal breath sounds.  Abdominal:     Palpations: Abdomen is soft.     Tenderness: There is no abdominal tenderness.  Musculoskeletal:     Cervical back: No tenderness.     Right lower leg: No edema.     Left lower leg: No edema.  Lymphadenopathy:     Cervical: No cervical  adenopathy.  Skin:    General: Skin is warm and dry.     Capillary Refill: Capillary refill takes less than 2 seconds.  Neurological:     General: No focal deficit present.     Mental Status: He is alert and oriented to person, place, and time.  Psychiatric:        Mood and Affect: Mood normal.        Behavior: Behavior normal.      ASSESSMENT & PLAN: Problem List Items Addressed This Visit       Cardiovascular and Mediastinum   Essential hypertension    Well-controlled hypertension with normal blood pressure readings at home. Continue valsartan 320 mg, diltiazem 120 mg daily Recently started on [place] 25 mg daily Cardiovascular risks associated with hypertension discussed Dietary approaches to stop hypertension discussed.      Relevant Orders   Comprehensive metabolic panel   CBC with Differential   Atherosclerosis of native coronary artery of native heart without angina pectoris    No recent anginal episodes. Continues atorvastatin 40 mg daily and daily baby aspirin      Relevant Orders   Lipid panel   CBC with Differential   Other Visit Diagnoses     Routine general medical examination at a health care facility    -  Primary   Relevant Orders   PSA(Must document that pt has been informed of limitations of PSA testing.)   Lipid panel   Hemoglobin A1c   Comprehensive metabolic panel   CBC with Differential   Prostate cancer screening       Relevant Orders   PSA(Must document that pt has been informed of limitations of PSA testing.)   Screening for deficiency anemia       Relevant Orders   CBC with Differential   Screening for lipoid disorders        Relevant Orders   Lipid panel   Screening for endocrine, metabolic and immunity disorder       Relevant Orders   Hemoglobin A1c   Comprehensive metabolic panel      Modifiable risk factors discussed with patient. Anticipatory guidance according to age provided. The following topics were also discussed: Social Determinants of Health Smoking.  Non-smoker Diet and nutrition.  Good eating habits Benefits of exercise.  Exercises regularly Cancer screening and review of most recent colonoscopy report Vaccinations reviewed and recommendations Cardiovascular risk assessment The 10-year ASCVD risk score (Arnett DK, et al., 2019) is: 24.3%   Values used to calculate the score:     Age: 66 years     Sex: Male     Is Non-Hispanic African American: No     Diabetic: No     Tobacco smoker: No     Systolic Blood Pressure: 353 mmHg     Is BP treated: Yes     HDL Cholesterol: 48.4 mg/dL     Total Cholesterol: 135 mg/dL Review of chronic medical problems including hypertension Review of all medications Mental health including depression and anxiety Fall and accident prevention  Patient Instructions  Health Maintenance, Male Adopting a healthy lifestyle and getting preventive care are important in promoting health and wellness. Ask your health care provider about: The right schedule for you to have regular tests and exams. Things you can do on your own to prevent diseases and keep yourself healthy. What should I know about diet, weight, and exercise? Eat a healthy diet  Eat a diet that includes plenty of vegetables, fruits, low-fat  dairy products, and lean protein. Do not eat a lot of foods that are high in solid fats, added sugars, or sodium. Maintain a healthy weight Body mass index (BMI) is a measurement that can be used to identify possible weight problems. It estimates body fat based on height and weight. Your health care provider can help determine your BMI and help you achieve or  maintain a healthy weight. Get regular exercise Get regular exercise. This is one of the most important things you can do for your health. Most adults should: Exercise for at least 150 minutes each week. The exercise should increase your heart rate and make you sweat (moderate-intensity exercise). Do strengthening exercises at least twice a week. This is in addition to the moderate-intensity exercise. Spend less time sitting. Even light physical activity can be beneficial. Watch cholesterol and blood lipids Have your blood tested for lipids and cholesterol at 75 years of age, then have this test every 5 years. You may need to have your cholesterol levels checked more often if: Your lipid or cholesterol levels are high. You are older than 75 years of age. You are at high risk for heart disease. What should I know about cancer screening? Many types of cancers can be detected early and may often be prevented. Depending on your health history and family history, you may need to have cancer screening at various ages. This may include screening for: Colorectal cancer. Prostate cancer. Skin cancer. Lung cancer. What should I know about heart disease, diabetes, and high blood pressure? Blood pressure and heart disease High blood pressure causes heart disease and increases the risk of stroke. This is more likely to develop in people who have high blood pressure readings or are overweight. Talk with your health care provider about your target blood pressure readings. Have your blood pressure checked: Every 3-5 years if you are 11-40 years of age. Every year if you are 8 years old or older. If you are between the ages of 54 and 70 and are a current or former smoker, ask your health care provider if you should have a one-time screening for abdominal aortic aneurysm (AAA). Diabetes Have regular diabetes screenings. This checks your fasting blood sugar level. Have the screening done: Once every three  years after age 59 if you are at a normal weight and have a low risk for diabetes. More often and at a younger age if you are overweight or have a high risk for diabetes. What should I know about preventing infection? Hepatitis B If you have a higher risk for hepatitis B, you should be screened for this virus. Talk with your health care provider to find out if you are at risk for hepatitis B infection. Hepatitis C Blood testing is recommended for: Everyone born from [birth year] through 1963. Anyone with known risk factors for hepatitis C. Sexually transmitted infections (STIs) You should be screened each year for STIs, including gonorrhea and chlamydia, if: You are sexually active and are younger than 75 years of age. You are older than 75 years of age and your health care provider tells you that you are at risk for this type of infection. Your sexual activity has changed since you were last screened, and you are at increased risk for chlamydia or gonorrhea. Ask your health care provider if you are at risk. Ask your health care provider about whether you are at high risk for HIV. Your health care provider may recommend a prescription medicine to help prevent HIV  infection. If you choose to take medicine to prevent HIV, you should first get tested for HIV. You should then be tested every 3 months for as long as you are taking the medicine. Follow these instructions at home: Alcohol use Do not drink alcohol if your health care provider tells you not to drink. If you drink alcohol: Limit how much you have to 0-2 drinks a day. Know how much alcohol is in your drink. In the [place] one drink equals one 12 oz bottle of beer (355 mL), one 5 oz glass of wine (148 mL), or one 1 oz glass of hard liquor (44 mL). Lifestyle Do not use any products that contain nicotine or tobacco. These products include cigarettes, chewing tobacco, and vaping devices, such as e-cigarettes. If you need help quitting, ask your health  care provider. Do not use street drugs. Do not share needles. Ask your health care provider for help if you need support or information about quitting drugs. General instructions Schedule regular health, dental, and eye exams. Stay current with your vaccines. Tell your health care provider if: You often feel depressed. You have ever been abused or do not feel safe at home. Summary Adopting a healthy lifestyle and getting preventive care are important in promoting health and wellness. Follow your health care provider's instructions about healthy diet, exercising, and getting tested or screened for diseases. Follow your health care provider's instructions on monitoring your cholesterol and blood pressure. This information is not intended to replace advice given to you by your health care provider. Make sure you discuss any questions you have with your health care provider. Document Revised: 07/22/2020 Document Reviewed: 07/22/2020 Elsevier Patient Education  [place], MD [facility]

## 2022-04-08 NOTE — Assessment & Plan Note (Signed)
No recent anginal episodes. Continues atorvastatin 40 mg daily and daily baby aspirin

## 2022-04-08 NOTE — Patient Instructions (Signed)
Health Maintenance, Male Adopting a healthy lifestyle and getting preventive care are important in promoting health and wellness. Ask your health care provider about: The right schedule for you to have regular tests and exams. Things you can do on your own to prevent diseases and keep yourself healthy. What should I know about diet, weight, and exercise? Eat a healthy diet  Eat a diet that includes plenty of vegetables, fruits, low-fat dairy products, and lean protein. Do not eat a lot of foods that are high in solid fats, added sugars, or sodium. Maintain a healthy weight Body mass index (BMI) is a measurement that can be used to identify possible weight problems. It estimates body fat based on height and weight. Your health care provider can help determine your BMI and help you achieve or maintain a healthy weight. Get regular exercise Get regular exercise. This is one of the most important things you can do for your health. Most adults should: Exercise for at least 150 minutes each week. The exercise should increase your heart rate and make you sweat (moderate-intensity exercise). Do strengthening exercises at least twice a week. This is in addition to the moderate-intensity exercise. Spend less time sitting. Even light physical activity can be beneficial. Watch cholesterol and blood lipids Have your blood tested for lipids and cholesterol at 75 years of age, then have this test every 5 years. You may need to have your cholesterol levels checked more often if: Your lipid or cholesterol levels are high. You are older than 75 years of age. You are at high risk for heart disease. What should I know about cancer screening? Many types of cancers can be detected early and may often be prevented. Depending on your health history and family history, you may need to have cancer screening at various ages. This may include screening for: Colorectal cancer. Prostate cancer. Skin cancer. Lung  cancer. What should I know about heart disease, diabetes, and high blood pressure? Blood pressure and heart disease High blood pressure causes heart disease and increases the risk of stroke. This is more likely to develop in people who have high blood pressure readings or are overweight. Talk with your health care provider about your target blood pressure readings. Have your blood pressure checked: Every 3-5 years if you are 18-39 years of age. Every year if you are 40 years old or older. If you are between the ages of 65 and 75 and are a current or former smoker, ask your health care provider if you should have a one-time screening for abdominal aortic aneurysm (AAA). Diabetes Have regular diabetes screenings. This checks your fasting blood sugar level. Have the screening done: Once every three years after age 45 if you are at a normal weight and have a low risk for diabetes. More often and at a younger age if you are overweight or have a high risk for diabetes. What should I know about preventing infection? Hepatitis B If you have a higher risk for hepatitis B, you should be screened for this virus. Talk with your health care provider to find out if you are at risk for hepatitis B infection. Hepatitis C Blood testing is recommended for: Everyone born from 1945 through 1965. Anyone with known risk factors for hepatitis C. Sexually transmitted infections (STIs) You should be screened each year for STIs, including gonorrhea and chlamydia, if: You are sexually active and are younger than 75 years of age. You are older than 75 years of age and your   health care provider tells you that you are at risk for this type of infection. Your sexual activity has changed since you were last screened, and you are at increased risk for chlamydia or gonorrhea. Ask your health care provider if you are at risk. Ask your health care provider about whether you are at high risk for HIV. Your health care provider  may recommend a prescription medicine to help prevent HIV infection. If you choose to take medicine to prevent HIV, you should first get tested for HIV. You should then be tested every 3 months for as long as you are taking the medicine. Follow these instructions at home: Alcohol use Do not drink alcohol if your health care provider tells you not to drink. If you drink alcohol: Limit how much you have to 0-2 drinks a day. Know how much alcohol is in your drink. In the [place] one drink equals one 12 oz bottle of beer (355 mL), one 5 oz glass of wine (148 mL), or one 1 oz glass of hard liquor (44 mL). Lifestyle Do not use any products that contain nicotine or tobacco. These products include cigarettes, chewing tobacco, and vaping devices, such as e-cigarettes. If you need help quitting, ask your health care provider. Do not use street drugs. Do not share needles. Ask your health care provider for help if you need support or information about quitting drugs. General instructions Schedule regular health, dental, and eye exams. Stay current with your vaccines. Tell your health care provider if: You often feel depressed. You have ever been abused or do not feel safe at home. Summary Adopting a healthy lifestyle and getting preventive care are important in promoting health and wellness. Follow your health care provider's instructions about healthy diet, exercising, and getting tested or screened for diseases. Follow your health care provider's instructions on monitoring your cholesterol and blood pressure. This information is not intended to replace advice given to you by your health care provider. Make sure you discuss any questions you have with your health care provider. Document Revised: 07/22/2020 Document Reviewed: 07/22/2020 Elsevier Patient Education  2023 [employer] Inc[employer]  

## 2022-04-09 LAB — CBC WITH DIFFERENTIAL/PLATELET
Basophils Absolute: 0 10*3/uL (ref 0.0–0.1)
Basophils Relative: 0.7 % (ref 0.0–3.0)
Eosinophils Absolute: 0.1 10*3/uL (ref 0.0–0.7)
Eosinophils Relative: 1.4 % (ref 0.0–5.0)
HCT: 39.2 % (ref 39.0–52.0)
Hemoglobin: 13.4 g/dL (ref 13.0–17.0)
Lymphocytes Relative: 44.6 % (ref 12.0–46.0)
Lymphs Abs: 3 10*3/uL (ref 0.7–4.0)
MCHC: 34.2 g/dL (ref 30.0–36.0)
MCV: 89 fl (ref 78.0–100.0)
Monocytes Absolute: 0.4 10*3/uL (ref 0.1–1.0)
Monocytes Relative: 6.1 % (ref 3.0–12.0)
Neutro Abs: 3.1 10*3/uL (ref 1.4–7.7)
Neutrophils Relative %: 47.2 % (ref 43.0–77.0)
Platelets: 235 10*3/uL (ref 150.0–400.0)
RBC: 4.4 Mil/uL (ref 4.22–5.81)
RDW: 13.3 % (ref 11.5–15.5)
WBC: 6.6 10*3/uL (ref 4.0–10.5)

## 2022-04-09 LAB — COMPREHENSIVE METABOLIC PANEL
ALT: 15 U/L (ref 0–53)
AST: 19 U/L (ref 0–37)
Albumin: 4.1 g/dL (ref 3.5–5.2)
Alkaline Phosphatase: 58 U/L (ref 39–117)
BUN: 15 mg/dL (ref 6–23)
CO2: 28 mEq/L (ref 19–32)
Calcium: 9.4 mg/dL (ref 8.4–10.5)
Chloride: 105 mEq/L (ref 96–112)
Creatinine, Ser: 1.26 mg/dL (ref 0.40–1.50)
GFR: 56.31 mL/min — ABNORMAL LOW (ref >60.00)
Glucose, Bld: 120 mg/dL — ABNORMAL HIGH (ref 70–99)
Potassium: 4.2 mEq/L (ref 3.5–5.1)
Sodium: 141 mEq/L (ref 135–145)
Total Bilirubin: 0.4 mg/dL (ref 0.2–1.2)
Total Protein: 6.1 g/dL (ref 6.0–8.3)

## 2022-04-09 LAB — LIPID PANEL
Cholesterol: 139 mg/dL (ref 0–200)
HDL: 42.2 mg/dL (ref >39.00)
LDL Cholesterol: 65 mg/dL (ref 0–99)
NonHDL: 96.56
Total CHOL/HDL Ratio: 3
Triglycerides: 156 mg/dL — ABNORMAL HIGH (ref 0.0–149.0)
VLDL: 31.2 mg/dL (ref 0.0–40.0)

## 2022-04-09 LAB — HEMOGLOBIN A1C: Hgb A1c MFr Bld: 6 % (ref 4.6–6.5)

## 2022-04-09 LAB — PSA: PSA: 0.6 ng/mL (ref 0.10–4.00)

## 2022-05-27 ENCOUNTER — Ambulatory Visit: Payer: Federal, State, Local not specified - PPO | Admitting: Cardiology

## 2022-05-27 ENCOUNTER — Encounter: Payer: Self-pay | Admitting: Cardiology

## 2022-05-27 VITALS — BP 120/73 | HR 68 | Resp 16 | Ht 70.0 in | Wt 165.6 lb

## 2022-05-27 DIAGNOSIS — I10 Essential (primary) hypertension: Secondary | ICD-10-CM

## 2022-05-27 DIAGNOSIS — E7800 Pure hypercholesterolemia, unspecified: Secondary | ICD-10-CM

## 2022-05-27 DIAGNOSIS — I25118 Atherosclerotic heart disease of native coronary artery with other forms of angina pectoris: Secondary | ICD-10-CM

## 2022-05-27 DIAGNOSIS — R0609 Other forms of dyspnea: Secondary | ICD-10-CM

## 2022-05-27 DIAGNOSIS — R5381 Other malaise: Secondary | ICD-10-CM

## 2022-05-27 MED ORDER — CHLORTHALIDONE 25 MG PO TABS: 12.5000 mg | ORAL_TABLET | ORAL | 3 refills | Status: DC

## 2022-05-27 NOTE — Progress Notes (Deleted)
EKG 05/27/2022: Normal sinus rhythm heart rate of 62 bpm, normal axis.  Right bundle branch block.  Compared to 10/15/2021, no significant change.

## 2022-05-27 NOTE — Progress Notes (Signed)
Primary Physician/Referring:  Horald Pollen, MD  Patient ID: Edwin Mckee., adult    DOB: [birth year], 77 y.o.   MRN: [id number]  Chief Complaint  Patient presents with  . Coronary Artery Disease  . Hypertension  . Hyperlipidemia   HPI:    Edwin Mckee.  is a 77 y.o. Caucasian male with coronary artery disease with history of non-STEMI  04/25/2014 S/P stenting of the proximal and mid LAD and balloon angioplasty of the D1, mild diffuse disease in other vessels, LVEF 50-55%. He also has hypertension, hyperglycemia and mild hyperlipidemia.  Due to typical atrial flutter, underwent atrial flutter ablation on 02/24/2021.  His anticoagulation has been discontinued since March 2023 by EP.  This is annual visit.  Patient has started to experience similar symptoms prior to his flutter ablation and CAD, states that he is noticing shortness of breath and marked fatigue with doing even minimal amount of activity and occasionally also has chest pain.  No PND or orthopnea or leg edema.  Past Medical History:  Diagnosis Date  . Arthritis   . Depression   . Depression    Phreesia 01/03/2020  . Hyperlipidemia    controlled with meds   . Hypertension   . NSTEMI (non-ST elevated myocardial infarction) ([place]) 04/25/2014  . Postmenopausal   . Substance abuse [facility])    Past Surgical History:  Procedure Laterality Date  . A-FLUTTER ABLATION N/A 02/24/2021   Procedure: A-FLUTTER ABLATION;  Surgeon: Vickie Epley, MD;  Location: [place] [facility];  Service: Cardiovascular;  Laterality: N/A;  . COLONOSCOPY    . HERNIA REPAIR    . LEFT HEART CATHETERIZATION WITH CORONARY ANGIOGRAM N/A 04/25/2014   Procedure: LEFT HEART CATHETERIZATION WITH CORONARY ANGIOGRAM;  Surgeon: Laverda Page, MD;  Location: [facility] CATH LAB;  Service: Cardiovascular;  Laterality: N/A; with stents placement   . POLYPECTOMY    . VASECTOMY     Social History   Tobacco Use  . Smoking status: Former     Packs/day: 0.50    Years: 10.00    Total pack years: 5.00    Types: Cigarettes    Quit date: 04/12/1982    Years since quitting: 40.1  . Smokeless tobacco: Never  Substance Use Topics  . Alcohol use: No    Alcohol/week: 0.0 standard drinks of alcohol  Marital Status: Married   ROS  Review of Systems  Constitutional: Positive for malaise/fatigue.  Cardiovascular:  Positive for chest pain and dyspnea on exertion. Negative for leg swelling.   Objective      05/27/2022    2:41 PM 04/08/2022    9:29 AM 04/08/2022    9:02 AM  Vitals with BMI  Height '5\' 10"'$   '5\' 10"'$   Weight 165 lbs 10 oz  165 lbs 2 oz  BMI [id number]  [id number]  Systolic [id number]  Diastolic 73 70 82  Pulse 68  54    Orthostatic VS for the past 72 hrs (Last 3 readings):  Patient Position BP Location Cuff Size  05/27/22 1441 Sitting Left Arm Normal     Physical Exam Neck:     Vascular: No carotid bruit or JVD.  Cardiovascular:     Rate and Rhythm: Normal rate and regular rhythm.     Pulses: Normal pulses and intact distal pulses.     Heart sounds:     No gallop.  Pulmonary:     Effort: Pulmonary effort is normal.     Breath  sounds: Normal breath sounds.  Abdominal:     General: Bowel sounds are normal.     Palpations: Abdomen is soft.  Musculoskeletal:     Right lower leg: No edema.     Left lower leg: No edema.   Laboratory examination:   Lab Results  Component Value Date   NA 141 04/09/2022   K 4.2 04/09/2022   CO2 28 04/09/2022   GLUCOSE 120 (H) 04/09/2022   BUN 15 04/09/2022   CREATININE 1.26 04/09/2022   CALCIUM 9.4 04/09/2022   EGFR 65 11/10/2021   GFRNONAA 58 (L) 12/19/2020       Latest Ref Rng & Units 04/09/2022   10:30 AM 11/10/2021    1:58 PM 04/02/2021   10:12 AM  CMP  Glucose 70 - 99 mg/dL 120  91  119   BUN 6 - 23 mg/dL '15  16  16   '$ Creatinine 0.40 - 1.50 mg/dL 1.26  1.18  1.07   Sodium 135 - 145 mEq/L 141  140  141   Potassium 3.5 - 5.1 mEq/L 4.2  4.5  4.9   Chloride 96 - 112  mEq/L 105  102  106   CO2 19 - 32 mEq/L '28  26  28   '$ Calcium 8.4 - 10.5 mg/dL 9.4  9.7  9.5   Total Protein 6.0 - 8.3 g/dL 6.1   6.6   Total Bilirubin 0.2 - 1.2 mg/dL 0.4   0.5   Alkaline Phos 39 - 117 U/L 58   60   AST 0 - 37 U/L 19   22   ALT 0 - 53 U/L 15   21       Latest Ref Rng & Units 04/09/2022   10:30 AM 04/02/2021   10:12 AM 02/17/2021    9:41 AM  CBC  WBC 4.0 - 10.5 K/uL 6.6  6.3  7.4   Hemoglobin 13.0 - 17.0 g/dL 13.4  14.0  12.7   Hematocrit 39.0 - 52.0 % 39.2  43.3  37.7   Platelets 150.0 - 400.0 K/uL 235.0  208.0  231    Lipid Panel Recent Labs    04/09/22 1030  CHOL 139  TRIG 156.0*  LDLCALC 65  VLDL 31.2  HDL 42.20  CHOLHDL 3    HEMOGLOBIN A1C Lab Results  Component Value Date   HGBA1C 6.0 04/09/2022   Lab Results  Component Value Date   TSH 3.130 01/04/2020    Allergies  No Known Allergies   Final Medications at End of Visit     Current Outpatient Medications:  .  acetaminophen (TYLENOL) 500 MG tablet, Take 1,000 mg by mouth every 8 (eight) hours as needed for moderate pain., Disp: , Rfl:  .  aspirin (ASPIRIN CHILDRENS) 81 MG chewable tablet, Chew 1 tablet (81 mg total) by mouth daily., Disp: , Rfl:  .  diltiazem (CARDIZEM CD) 120 MG 24 hr capsule, Take 1 capsule (120 mg total) by mouth every evening., Disp: 90 capsule, Rfl: 3 .  valsartan (DIOVAN) 320 MG tablet, Take 1 tablet (320 mg total) by mouth daily., Disp: 90 tablet, Rfl: 3 .  chlorthalidone (HYGROTON) 25 MG tablet, Take 0.5 tablets (12.5 mg total) by mouth every morning., Disp: 90 tablet, Rfl: 3   Radiology:  No results found.  Cardiac Studies:   Coronary Angiography 04/25/2014: PTCA and stenting of the proximal and mid LAD with implantation of a 3.0 x 38 mm resolute DES and balloon angioplasty of the D1 with  2.5 x 15 mm balloon. Mild disease otherwise. LVEF 50-55%  Exercise sestamibi stress test 07/20/2016: 1. The resting electrocardiogram demonstrated normal sinus rhythm, RBBB and  no resting arrhythmias.  The stress electrocardiogram was normal.  Occasional PVC. Patient exercised on Bruce protocol for 7:30 minutes and achieved 9.34 METS. Stress test terminated due to fatigue and 95% MPHR achieved (Target HR >85%).  2. The LV is dilated both at rest and stress images. The LV end diastolic volume was  [id number]. REST and STRESS images demonstrate decreased tracer uptake in the mid inferoseptal and apical septal segments of the left ventricle.  These defects are related to diaphragmatic attenuation.  The left ventricular ejection fraction was calculated to be 48% bu visually appears to be normal.  This is a low risk study.  PCV ECHOCARDIOGRAM COMPLETE 09/18/2020 Left ventricle cavity is normal in size and wall thickness. Normal global wall motion. Normal LV systolic function with EF 66%. Doppler evidence of grade I (impaired) diastolic dysfunction, normal LAP. No significant valvular abnormality. Previous study in 2018 had noted normal diastolic function.   Ambulatory cardiac telemetry 08/27/2020 - 09/10/2020: Predominant underlying rhythm was sinus with first-degree AV block and bundle branch block.  Heart rate 43-175 bpm, average heart rate 75 bpm.  7 episodes of supraventricular tachycardia longest lasting 19 beats.  PACs and PVCs were rare.  Patient's symptoms correlated with sinus rhythm and PACs.  No evidence of A. fib, high degree AV block, VT, or pauses >3 seconds.  Atrial flutter ablation 02/24/2021:  1. Isthmus-dependent counter clockwise right atrial flutter.   2. Successful radiofrequency ablation of atrial flutter along the cavotricuspid isthmus with complete bidirectional isthmus block achieved.   3. No inducible arrhythmias following ablation.   4. No early apparent complications.   EKG:    EKG 05/27/2022: Normal sinus rhythm heart rate of 62 bpm, normal axis.  Right bundle branch block.  Compared to 10/15/2021, no significant change.  Assessment     ICD-10-CM   1.  Coronary artery disease of native artery of native heart with stable angina pectoris ([place])  I25.118 EKG 12-Lead    PCV MYOCARDIAL PERFUSION WO LEXISCAN    2. Dyspnea on exertion  R06.09 PCV MYOCARDIAL PERFUSION WO LEXISCAN    3. Hypercholesteremia  E78.00     4. Primary hypertension  I10 VITAMIN D 25 Hydroxy (Vit-D Deficiency, Fractures)    chlorthalidone (HYGROTON) 25 MG tablet    5. Malaise and fatigue  R53.81    R53.83      Meds ordered this encounter  Medications  . chlorthalidone (HYGROTON) 25 MG tablet    Sig: Take 0.5 tablets (12.5 mg total) by mouth every morning.    Dispense:  90 tablet    Refill:  3   Recommendations:   Edwin Mckee.  is a 77 y.o. Caucasian male with coronary artery disease with history of non-STEMI  04/25/2014 S/P stenting of the proximal and mid LAD and balloon angioplasty of the D1, mild diffuse disease in other vessels, LVEF 50-55%. He also has hypertension, hyperglycemia and mild hyperlipidemia.  Due to typical atrial flutter, underwent atrial flutter ablation on 02/24/2021.  His anticoagulation has been discontinued since March 2023 by EP.  1. Coronary artery disease of native artery of native heart with stable angina pectoris [facility]) This is annual visit.  Patient has started to experience similar symptoms prior to his flutter ablation and CAD, states that he is noticing shortness of breath and marked fatigue with doing  even minimal amount of activity and occasionally also has chest pain.  No PND or orthopnea or leg edema.  His last nuclear stress test was >5 years ago since angioplasty.  2. Dyspnea on exertion No clinical evidence of heart failure.  EKG reveals no new abnormality except for underlying right bundle branch block.  If symptoms do not improve or if the stress test is negative, will consider repeating echocardiogram, last echocardiogram was past year which was within normal limits.  3. Hypercholesteremia Lipids under excellent  control.  However in view of marked fatigue, myalgias and arthralgias, I have advised him statin holiday for the next 4 to 6 weeks until I see him back.  I will also check vitamin D levels.  4. Primary hypertension Blood pressure is under excellent control since addition of chlorthalidone to valsartan.  I will reduce the dose of chlorthalidone from 25 mg to 12.5 mg in the morning as his blood pressure is soft although asymptomatic.  Hopefully this will also help with him feeling more energetic.  5. Malaise and fatigue His fatigue and malaise may be related to a combination of statin side effect and also potentially borderline low blood pressure although there is no dizziness.  I will follow-up on the above changes that are made on his next office visit with me in 4 to 6 weeks.     Adrian Prows, MD, [facility] 05/27/2022, 3:37 PM Office: [phone] Fax: [phone] Pager: [phone]

## 2022-05-27 NOTE — Patient Instructions (Signed)
Hold both diltiazem and chlorthalidone on the day of the stress test.  Please hold diltiazem 1 day prior to the stress test.  Discontinue taking atorvastatin.

## 2022-05-28 LAB — VITAMIN D 25 HYDROXY (VIT D DEFICIENCY, FRACTURES): Vit D, 25-Hydroxy: 31.1 ng/mL (ref 30.0–100.0)

## 2022-06-23 ENCOUNTER — Other Ambulatory Visit: Payer: Self-pay

## 2022-06-23 DIAGNOSIS — I10 Essential (primary) hypertension: Secondary | ICD-10-CM

## 2022-06-23 MED ORDER — VALSARTAN 320 MG PO TABS: 320.0000 mg | ORAL_TABLET | Freq: Every day | ORAL | 3 refills | Status: DC

## 2022-06-25 ENCOUNTER — Ambulatory Visit: Payer: Federal, State, Local not specified - PPO

## 2022-06-25 DIAGNOSIS — R0609 Other forms of dyspnea: Secondary | ICD-10-CM

## 2022-06-25 DIAGNOSIS — I25118 Atherosclerotic heart disease of native coronary artery with other forms of angina pectoris: Secondary | ICD-10-CM

## 2022-06-29 LAB — PCV MYOCARDIAL PERFUSION WO LEXISCAN: Angina Index: 0

## 2022-07-01 ENCOUNTER — Ambulatory Visit
Admission: EM | Admit: 2022-07-01 | Discharge: 2022-07-01 | Disposition: A | Discharge status: Home / Self Care | Payer: Federal, State, Local not specified - PPO

## 2022-07-01 ENCOUNTER — Emergency Department (HOSPITAL_COMMUNITY)
Admission: EM | Admit: 2022-07-01 | Discharge: 2022-07-01 | Disposition: A | Discharge status: Home / Self Care | Payer: Federal, State, Local not specified - PPO | Attending: Emergency Medicine | Admitting: Emergency Medicine

## 2022-07-01 ENCOUNTER — Encounter (HOSPITAL_COMMUNITY): Payer: Self-pay

## 2022-07-01 ENCOUNTER — Emergency Department (HOSPITAL_COMMUNITY): Payer: Federal, State, Local not specified - PPO

## 2022-07-01 ENCOUNTER — Other Ambulatory Visit: Payer: Self-pay

## 2022-07-01 DIAGNOSIS — I10 Essential (primary) hypertension: Secondary | ICD-10-CM | POA: Diagnosis not present

## 2022-07-01 DIAGNOSIS — S01111A Laceration without foreign body of right eyelid and periocular area, initial encounter: Secondary | ICD-10-CM | POA: Diagnosis not present

## 2022-07-01 DIAGNOSIS — W01198A Fall on same level from slipping, tripping and stumbling with subsequent striking against other object, initial encounter: Secondary | ICD-10-CM | POA: Insufficient documentation

## 2022-07-01 DIAGNOSIS — Z79899 Other long term (current) drug therapy: Secondary | ICD-10-CM | POA: Diagnosis not present

## 2022-07-01 DIAGNOSIS — W19XXXA Unspecified fall, initial encounter: Secondary | ICD-10-CM

## 2022-07-01 DIAGNOSIS — I2510 Atherosclerotic heart disease of native coronary artery without angina pectoris: Secondary | ICD-10-CM | POA: Diagnosis not present

## 2022-07-01 DIAGNOSIS — S0181XA Laceration without foreign body of other part of head, initial encounter: Secondary | ICD-10-CM

## 2022-07-01 DIAGNOSIS — S51811A Laceration without foreign body of right forearm, initial encounter: Secondary | ICD-10-CM | POA: Diagnosis not present

## 2022-07-01 DIAGNOSIS — S0993XA Unspecified injury of face, initial encounter: Secondary | ICD-10-CM | POA: Diagnosis present

## 2022-07-01 DIAGNOSIS — S0990XA Unspecified injury of head, initial encounter: Secondary | ICD-10-CM

## 2022-07-01 DIAGNOSIS — Y92007 Garden or yard of unspecified non-institutional (private) residence as the place of occurrence of the external cause: Secondary | ICD-10-CM | POA: Diagnosis not present

## 2022-07-01 DIAGNOSIS — Z23 Encounter for immunization: Secondary | ICD-10-CM | POA: Insufficient documentation

## 2022-07-01 DIAGNOSIS — S61422A Laceration with foreign body of left hand, initial encounter: Secondary | ICD-10-CM | POA: Diagnosis not present

## 2022-07-01 DIAGNOSIS — Z7982 Long term (current) use of aspirin: Secondary | ICD-10-CM | POA: Diagnosis not present

## 2022-07-01 LAB — RAPID HIV SCREEN (HIV 1/2 AB+AG)
HIV 1/2 Antibodies: NONREACTIVE
HIV-1 P24 Antigen - HIV24: NONREACTIVE
Interpretation (HIV Ag Ab): NONREACTIVE

## 2022-07-01 MED ORDER — TETANUS-DIPHTH-ACELL PERTUSSIS 5-2.5-18.5 LF-MCG/0.5 IM SUSY
0.5000 mL | PREFILLED_SYRINGE | Freq: Once | INTRAMUSCULAR | Status: AC
  Administered 2022-07-01: 0.5 mL via INTRAMUSCULAR
  Filled 2022-07-01: qty 0.5

## 2022-07-01 MED ORDER — LIDOCAINE-EPINEPHRINE-TETRACAINE (LET) TOPICAL GEL
3.0000 mL | Freq: Once | TOPICAL | Status: DC
  Filled 2022-07-01: qty 3

## 2022-07-01 MED ORDER — LIDOCAINE HCL (PF) 1 % IJ SOLN
5.0000 mL | Freq: Once | INTRAMUSCULAR | Status: DC
  Filled 2022-07-01: qty 30

## 2022-07-01 MED ORDER — CEPHALEXIN 500 MG PO CAPS: 1000.0000 mg | ORAL_CAPSULE | Freq: Three times a day (TID) | ORAL | 0 refills | Status: AC

## 2022-07-01 MED ORDER — MORPHINE SULFATE (PF) 4 MG/ML IV SOLN
4.0000 mg | Freq: Once | INTRAVENOUS | Status: AC
  Administered 2022-07-01: 4 mg via INTRAMUSCULAR
  Filled 2022-07-01: qty 1

## 2022-07-01 NOTE — ED Triage Notes (Addendum)
Pt states he tripped and fell and hit his face on a fence laceration to right eye brow,bleeding controlled with pressure. Pt unsure about LOC.

## 2022-07-01 NOTE — ED Provider Notes (Signed)
[facility] - URGENT CARE CENTER  Note:  This document was prepared using Conservation officer, historic buildings and may include unintentional dictation errors.  MRN: [id number] DOB: [birth year]  Subjective:   Edwin Zee. is a 77 y.o. adult presenting for suffering an accidental fall. Patient reports that this was accidental, he ended up tripping in the yard and making impact against a fence.  Reports that he cannot recall if he lost consciousness.  He decided to drive himself to our clinic. No confusion, vision changes.  Does not know if he injured anything else.  Does not endorse chest pain, heart racing, palpitations. Has a history of atrial flutter with RVR status post ablation 2022.  Has a history of heart disease, NSTEMI.  Is supposed to be taking aspirin daily.  No current facility-administered medications for this encounter.  Current Outpatient Medications:    acetaminophen (TYLENOL) 500 MG tablet, Take 1,000 mg by mouth every 8 (eight) hours as needed for moderate pain., Disp: , Rfl:    aspirin (ASPIRIN CHILDRENS) 81 MG chewable tablet, Chew 1 tablet (81 mg total) by mouth daily., Disp: , Rfl:    chlorthalidone (HYGROTON) 25 MG tablet, Take 0.5 tablets (12.5 mg total) by mouth every morning., Disp: 90 tablet, Rfl: 3   diltiazem (CARDIZEM CD) 120 MG 24 hr capsule, Take 1 capsule (120 mg total) by mouth every evening., Disp: 90 capsule, Rfl: 3   valsartan (DIOVAN) 320 MG tablet, Take 1 tablet (320 mg total) by mouth daily., Disp: 90 tablet, Rfl: 3   No Known Allergies  Past Medical History:  Diagnosis Date   Arthritis    Depression    Depression    Phreesia 01/03/2020   Hyperlipidemia    controlled with meds    Hypertension    NSTEMI (non-ST elevated myocardial infarction) 04/25/2014   Postmenopausal    Substance abuse      Past Surgical History:  Procedure Laterality Date   A-FLUTTER ABLATION N/A 02/24/2021   Procedure: A-FLUTTER ABLATION;  Surgeon: Lanier Prude, MD;  Location: [facility];  Service: Cardiovascular;  Laterality: N/A;   COLONOSCOPY     HERNIA REPAIR     LEFT HEART CATHETERIZATION WITH CORONARY ANGIOGRAM N/A 04/25/2014   Procedure: LEFT HEART CATHETERIZATION WITH CORONARY ANGIOGRAM;  Surgeon: Pamella Pert, MD;  Location: [facility] CATH LAB;  Service: Cardiovascular;  Laterality: N/A; with stents placement    POLYPECTOMY     VASECTOMY      Family History  Problem Relation Age of Onset   Cancer Mother        lung   Hypertension Father    Alzheimer's disease Father    Heart disease Sister    Heart disease Brother    Stroke Maternal Grandmother    Alcohol abuse Maternal Grandfather    Cancer Maternal Grandfather    Stroke Paternal Grandmother    Cancer Paternal Grandfather    Colon cancer Neg Hx    Colon polyps Neg Hx    Esophageal cancer Neg Hx    Rectal cancer Neg Hx    Stomach cancer Neg Hx     Social History   Tobacco Use   Smoking status: Former    Packs/day: 0.50    Years: 10.00    Additional pack years: 0.00    Total pack years: 5.00    Types: Cigarettes    Quit date: 04/12/1982    Years since quitting: 40.2   Smokeless tobacco: Never  Vaping  Use   Vaping Use: Never used  Substance Use Topics   Alcohol use: No    Alcohol/week: 0.0 standard drinks of alcohol   Drug use: No    ROS   Objective:   Vitals: BP (!) 191/94 (BP Location: Right Arm)   Pulse 80   Resp 18   SpO2 96%   Physical Exam Constitutional:      General: He is not in acute distress.    Appearance: Normal appearance. He is well-developed and normal weight. He is not ill-appearing, toxic-appearing or diaphoretic.  HENT:     Head: Normocephalic and atraumatic.      Right Ear: External ear normal.     Left Ear: External ear normal.     Nose: Nose normal.     Mouth/Throat:     Pharynx: Oropharynx is clear.  Eyes:     General: No scleral icterus.       Right eye: No discharge.        Left eye: No discharge.      Extraocular Movements: Extraocular movements intact.     Pupils: Pupils are equal, round, and reactive to light.  Cardiovascular:     Rate and Rhythm: Normal rate.  Pulmonary:     Effort: Pulmonary effort is normal.  Musculoskeletal:     Cervical back: Normal range of motion.  Neurological:     Mental Status: He is alert and oriented to person, place, and time.     Cranial Nerves: No dysarthria or facial asymmetry.     Motor: No weakness.     Gait: Gait normal.  Psychiatric:        Mood and Affect: Mood normal.        Behavior: Behavior normal.        Thought Content: Thought content normal.        Judgment: Judgment normal.    Left hand wound cleansed.  Dressings applied to the large laceration of the right forehead, right forearm.  Assessment and Plan :   PDMP not reviewed this encounter.  1. Facial laceration, initial encounter   2. Accidental fall, initial encounter    Patient's wife arrived as we were evaluating the patient through the triage process.  He has a laceration that requires a higher level of care than I can provide in the urgent care setting.  The patient himself requested EMS transport.  I advised that it would be nonemergent and may take longer than his wife taking him to the emergency room.  His wife contracted for safety and stated that she would take him.  The patient did not endorse any cardiac symptoms and therefore an EKG was not performed, emergency transport by EMS was not considered.  I discussed this with both the patient and his wife.  I reported the case to the charge nurse at [facility] emergency room where they plan ongoing.   Wallis Bamberg, [place] 07/01/22 1610

## 2022-07-01 NOTE — ED Notes (Signed)
Patient is being discharged from the Urgent Care and sent to the Emergency Department via private vehicle . Per [facility], patient is in need of higher level of care due to facial laceration possible LOC. Patient is aware and verbalizes understanding of plan of care.  Vitals:   07/01/22 1442  BP: (!) 191/94  Pulse: 80  Resp: 18  SpO2: 96%

## 2022-07-01 NOTE — ED Notes (Signed)
Pts wife has arrived and states she will take pt to the ED by private vehicle.

## 2022-07-01 NOTE — ED Triage Notes (Signed)
Sent by UC for plastic surgery eval and head ct. Pt tripped and fell and hit fence. Laceration to right eye brow. Head wrapped on arrival, bleeding controlled. Denies blood thinners, denies LOC.

## 2022-07-01 NOTE — ED Provider Notes (Addendum)
[place] EMERGENCY DEPARTMENT AT [facility] Provider Note   CSN: [id number] Arrival date & time: 07/01/22  1515     History  Chief Complaint  Patient presents with   Fall   Head Injury   Laceration    Edwin Mckee. is a 77 y.o. adult with PMH CAD, HLD, HTN who presents to ED post mechanical fall for evaluation. Pt tripped while in yard and fell against wooden fence. Questionable LOC. Pt went to urgent care but sent to ED for further evaluation. He does not take anticoagulants. He denies nausea, vomiting, vision changes, focal weakness, or extremity pain. Last tetanus unknown. Ambulatory with ease since fall. Pt hit head during fall and sustained large laceration to right forehead/eyebrow as well as multiple skin tears and abrasions to extremities. Minimal associated bleeding.       Home Medications Prior to Admission medications   Medication Sig Start Date End Date Taking? Authorizing Provider  cephALEXin (KEFLEX) 500 MG capsule Take 2 capsules (1,000 mg total) by mouth 3 (three) times daily for 5 days. 07/01/22 07/06/22 Yes Maggie Senseney L, PA-C  acetaminophen (TYLENOL) 500 MG tablet Take 1,000 mg by mouth every 8 (eight) hours as needed for moderate pain.    [provider]  aspirin (ASPIRIN CHILDRENS) 81 MG chewable tablet Chew 1 tablet (81 mg total) by mouth daily. 10/15/21   Yates Decamp, MD  chlorthalidone (HYGROTON) 25 MG tablet Take 0.5 tablets (12.5 mg total) by mouth every morning. 05/27/22   Yates Decamp, MD  diltiazem (CARDIZEM CD) 120 MG 24 hr capsule Take 1 capsule (120 mg total) by mouth every evening. 10/15/21 10/10/22  Yates Decamp, MD  valsartan (DIOVAN) 320 MG tablet Take 1 tablet (320 mg total) by mouth daily. 06/23/22   Yates Decamp, MD      Allergies    Patient has no known allergies.    Review of Systems   Review of Systems  All other systems reviewed and are negative.   Physical Exam Updated Vital Signs BP (!) 157/67   Pulse 82   Temp  98.3 F (36.8 C) (Oral)   Resp 18   Ht  (1.778 m)   Wt 72.6 kg   SpO2 98%   BMI 22.96 kg/m  Physical Exam Vitals and nursing note reviewed.  Constitutional:      General: He is not in acute distress.    Appearance: Normal appearance. He is not toxic-appearing or diaphoretic.  HENT:     Head: Normocephalic.     Comments: Small superficial round abrasion to chin, large irregular flap-like laceration to right forehead/eyebrow, no eyelid/eye involvement, minimal active bleeding, multiple areas of ecchymosis across face worst to right forehead, periorbital region, and upper right cheek, nose/facial bones appear stable, no obvious deformity    Right Ear: External ear normal.     Left Ear: External ear normal.     Nose: Nose normal.     Comments: Stable, no septal hematoma    Mouth/Throat:     Mouth: Mucous membranes are moist.  Eyes:     Extraocular Movements: Extraocular movements intact.     Conjunctiva/sclera: Conjunctivae normal.     Pupils: Pupils are equal, round, and reactive to light.     Comments: No hemorrhage, no signs of globe injury or foreign body  Cardiovascular:     Rate and Rhythm: Normal rate and regular rhythm.     Heart sounds: No murmur heard. Pulmonary:  Effort: Pulmonary effort is normal.     Breath sounds: Normal breath sounds.  Chest:     Chest wall: No tenderness.  Abdominal:     General: Abdomen is flat. There is no distension.     Palpations: Abdomen is soft.     Tenderness: There is no abdominal tenderness. There is no guarding or rebound.  Musculoskeletal:        General: No deformity. Normal range of motion.     Cervical back: Normal range of motion and neck supple. No rigidity or tenderness.     Right lower leg: No edema.     Left lower leg: No edema.     Comments: 3 small superficial skin tears to right forearm, bleeding controlled, multiple scattered abrasions over remainder of extremities without significant bleeding, no deformity,  range of motion of all extremities intact, stable gait, no midline spinal tenderness/stepoffs/deformities, tiny superficial laceration to palm of left hand with large slightly irregular linear splinter visualized underneath skin  Skin:    General: Skin is warm and dry.     Capillary Refill: Capillary refill takes less than 2 seconds.  Neurological:     Mental Status: He is alert and oriented to person, place, and time.     GCS: GCS eye subscore is 4. GCS verbal subscore is 5. GCS motor subscore is 6.     Cranial Nerves: Cranial nerves 2-12 are intact. No dysarthria or facial asymmetry.     Sensory: Sensation is intact.     Motor: Motor function is intact. No weakness, tremor, atrophy, abnormal muscle tone or seizure activity.     Coordination: Coordination is intact.     Gait: Gait is intact.  Psychiatric:        Behavior: Behavior normal.     ED Results / Procedures / Treatments   Labs (all labs ordered are listed, but only abnormal results are displayed) Labs Reviewed  RAPID HIV SCREEN (HIV 1/2 AB+AG)  HEPATITIS PANEL, ACUTE    EKG None  Radiology CT Maxillofacial Wo Contrast  Result Date: 07/01/2022 CLINICAL DATA:  Facial trauma, blunt; Head trauma, moderate-severe; Neck trauma (Age >= 65y) EXAM: CT HEAD WITHOUT CONTRAST CT MAXILLOFACIAL WITHOUT CONTRAST CT CERVICAL SPINE WITHOUT CONTRAST TECHNIQUE: Multidetector CT imaging of the head, cervical spine, and maxillofacial structures were performed using the standard protocol without intravenous contrast. Multiplanar CT image reconstructions of the cervical spine and maxillofacial structures were also generated. RADIATION DOSE REDUCTION: This exam was performed according to the departmental dose-optimization program which includes automated exposure control, adjustment of the mA and/or kV according to patient size and/or use of iterative reconstruction technique. COMPARISON:  None Available. FINDINGS: CT HEAD FINDINGS Brain: No  evidence of acute infarction, hemorrhage, hydrocephalus, extra-axial collection or mass lesion/mass effect. Vascular: Calcific atherosclerosis. Skull: No acute fracture. Other: No mastoid effusions. CT MAXILLOFACIAL FINDINGS Osseous: No fracture or mandibular dislocation. No destructive process. Orbits: Negative. No traumatic or inflammatory finding. Sinuses: Clear. Soft tissues: Right forehead contusion/laceration. CT CERVICAL SPINE FINDINGS Alignment: Mild straightening.  No substantial sagittal subluxation. Skull base and vertebrae: Vertebral body heights are maintained. No evidence of acute fracture. Soft tissues and spinal canal: No prevertebral fluid or swelling. No visible canal hematoma. Disc levels: Multilevel facet and uncovertebral hypertrophy with varying degrees of neural foraminal stenosis. Upper chest: Visualized lung apices are clear. IMPRESSION: 1. No evidence of acute intracranial abnormality or facial fracture. Right forehead contusion/laceration. 2. No evidence of acute fracture or traumatic malalignment in the cervical  spine. Electronically Signed   By: Feliberto Harts M.D.   On: 07/01/2022 17:07   CT Head Wo Contrast  Result Date: 07/01/2022 CLINICAL DATA:  Facial trauma, blunt; Head trauma, moderate-severe; Neck trauma (Age >= 65y) EXAM: CT HEAD WITHOUT CONTRAST CT MAXILLOFACIAL WITHOUT CONTRAST CT CERVICAL SPINE WITHOUT CONTRAST TECHNIQUE: Multidetector CT imaging of the head, cervical spine, and maxillofacial structures were performed using the standard protocol without intravenous contrast. Multiplanar CT image reconstructions of the cervical spine and maxillofacial structures were also generated. RADIATION DOSE REDUCTION: This exam was performed according to the departmental dose-optimization program which includes automated exposure control, adjustment of the mA and/or kV according to patient size and/or use of iterative reconstruction technique. COMPARISON:  None Available.  FINDINGS: CT HEAD FINDINGS Brain: No evidence of acute infarction, hemorrhage, hydrocephalus, extra-axial collection or mass lesion/mass effect. Vascular: Calcific atherosclerosis. Skull: No acute fracture. Other: No mastoid effusions. CT MAXILLOFACIAL FINDINGS Osseous: No fracture or mandibular dislocation. No destructive process. Orbits: Negative. No traumatic or inflammatory finding. Sinuses: Clear. Soft tissues: Right forehead contusion/laceration. CT CERVICAL SPINE FINDINGS Alignment: Mild straightening.  No substantial sagittal subluxation. Skull base and vertebrae: Vertebral body heights are maintained. No evidence of acute fracture. Soft tissues and spinal canal: No prevertebral fluid or swelling. No visible canal hematoma. Disc levels: Multilevel facet and uncovertebral hypertrophy with varying degrees of neural foraminal stenosis. Upper chest: Visualized lung apices are clear. IMPRESSION: 1. No evidence of acute intracranial abnormality or facial fracture. Right forehead contusion/laceration. 2. No evidence of acute fracture or traumatic malalignment in the cervical spine. Electronically Signed   By: Feliberto Harts M.D.   On: 07/01/2022 17:07   CT Cervical Spine Wo Contrast  Result Date: 07/01/2022 CLINICAL DATA:  Facial trauma, blunt; Head trauma, moderate-severe; Neck trauma (Age >= 65y) EXAM: CT HEAD WITHOUT CONTRAST CT MAXILLOFACIAL WITHOUT CONTRAST CT CERVICAL SPINE WITHOUT CONTRAST TECHNIQUE: Multidetector CT imaging of the head, cervical spine, and maxillofacial structures were performed using the standard protocol without intravenous contrast. Multiplanar CT image reconstructions of the cervical spine and maxillofacial structures were also generated. RADIATION DOSE REDUCTION: This exam was performed according to the departmental dose-optimization program which includes automated exposure control, adjustment of the mA and/or kV according to patient size and/or use of iterative reconstruction  technique. COMPARISON:  None Available. FINDINGS: CT HEAD FINDINGS Brain: No evidence of acute infarction, hemorrhage, hydrocephalus, extra-axial collection or mass lesion/mass effect. Vascular: Calcific atherosclerosis. Skull: No acute fracture. Other: No mastoid effusions. CT MAXILLOFACIAL FINDINGS Osseous: No fracture or mandibular dislocation. No destructive process. Orbits: Negative. No traumatic or inflammatory finding. Sinuses: Clear. Soft tissues: Right forehead contusion/laceration. CT CERVICAL SPINE FINDINGS Alignment: Mild straightening.  No substantial sagittal subluxation. Skull base and vertebrae: Vertebral body heights are maintained. No evidence of acute fracture. Soft tissues and spinal canal: No prevertebral fluid or swelling. No visible canal hematoma. Disc levels: Multilevel facet and uncovertebral hypertrophy with varying degrees of neural foraminal stenosis. Upper chest: Visualized lung apices are clear. IMPRESSION: 1. No evidence of acute intracranial abnormality or facial fracture. Right forehead contusion/laceration. 2. No evidence of acute fracture or traumatic malalignment in the cervical spine. Electronically Signed   By: Feliberto Harts M.D.   On: 07/01/2022 17:07    Procedures .Marland KitchenLaceration Repair  Date/Time: 07/01/2022 7:45 PM  Performed by: Tonette Lederer, PA-C Authorized by: Tonette Lederer, PA-C   Consent:    Consent obtained:  Verbal   Consent given by:  Patient   Risks, benefits, and  alternatives were discussed: yes     Risks discussed:  Infection, pain, retained foreign body, poor cosmetic result, poor wound healing and need for additional repair   Alternatives discussed:  No treatment, delayed treatment and observation Universal protocol:    Procedure explained and questions answered to patient or proxy's satisfaction: yes     Immediately prior to procedure, a time out was called: yes     Patient identity confirmed:  Verbally with patient Anesthesia:     Anesthesia method:  Topical application and local infiltration   Topical anesthetic:  LET   Local anesthetic:  Lidocaine 1% w/o epi Laceration details:    Location:  Face   Face location:  R eyebrow   Length (cm):  5 Pre-procedure details:    Preparation:  Patient was prepped and draped in usual sterile fashion and imaging obtained to evaluate for foreign bodies Exploration:    Hemostasis achieved with:  LET and direct pressure   Imaging outcome: foreign body not noted     Wound exploration: entire depth of wound visualized     Wound extent: no foreign body and no underlying fracture   Treatment:    Area cleansed with:  Saline and povidone-iodine   Amount of cleaning:  Extensive   Irrigation solution:  Sterile saline   Irrigation method:  Syringe and tap   Visualized foreign bodies/material removed: no     Debridement:  None   Undermining:  None Skin repair:    Repair method:  Sutures and tissue adhesive   Suture size:  4-0   Suture material:  Prolene   Suture technique:  Simple interrupted   Number of sutures:  7 Repair type:    Repair type:  Intermediate Post-procedure details:    Dressing:  Antibiotic ointment and non-adherent dressing   Procedure completion:  Tolerated with difficulty Comments:     Some difficulty with sufficient anesthesia, required additional lidocaine injections post LET for suture placement, layer of dermabond applied over wound to assist with adequate closure .Foreign Body Removal  Date/Time: 07/01/2022 7:15 PM  Performed by: Tonette Lederer, PA-C Authorized by: Tonette Lederer, PA-C  Consent: Verbal consent obtained. Risks and benefits: risks, benefits and alternatives were discussed Consent given by: patient Patient understanding: patient states understanding of the procedure being performed Imaging studies: imaging studies available Patient identity confirmed: verbally with patient Time out: Immediately prior to procedure a "time out" was  called to verify the correct patient, procedure, equipment, support staff and site/side marked as required. Intake: L hand. Anesthesia: local infiltration  Anesthesia: Local Anesthetic: lidocaine 1% without epinephrine Anesthetic total: 5 mL  Sedation: Patient sedated: no  Patient restrained: no Patient cooperative: yes Complexity: complex Post-procedure assessment: foreign body removed Patient tolerance: patient tolerated the procedure well with no immediate complications Comments: Large fractured splinter to palm of left hand, multiple defects needed to be created at attempt for removal, several pieces removed with none remaining visualized though difficult to assess, wounds irrigated extensively to remove partial/remaining foreign bodies and will have pt soak at home for continued removal of any remaining particles and place on antibiotics to cover for infection      Medications Ordered in ED Medications  lidocaine-EPINEPHrine-tetracaine (LET) topical gel (has no administration in time range)  lidocaine (PF) (XYLOCAINE) 1 % injection 5 mL (has no administration in time range)  Tdap (BOOSTRIX) injection 0.5 mL (0.5 mLs Intramuscular Given 07/01/22 1704)  morphine (PF) 4 MG/ML injection 4 mg (4 mg Intramuscular  Given 07/01/22 1704)    ED Course/ Medical Decision Making/ A&P                             Medical Decision Making Amount and/or Complexity of Data Reviewed Labs: ordered. Decision-making details documented in ED Course. Radiology: ordered. Decision-making details documented in ED Course.  Risk Prescription drug management.   Medical Decision Making:   Edwin Mckee. is a 77 y.o. adult who presented to the ED today with fall/laceration detailed above.    Additional history discussed with patient's family/caregivers.  Patient's presentation is complicated by their history of trauma, advanced age.  Complete initial physical exam performed, notably the patient   was in no acute distress. Large laceration to right forehead/eyebrow. Multiple abrasions/skin tears/ecchymoses to face/extremities. Neurologically intact. No deformities noted. Foreign body to palm of left hand. No signs of ocular injury.    Reviewed and confirmed nursing documentation for past medical history, family history, social history.    Initial Assessment:   With the patient's presentation of laceration, differential diagnosis includes but is not limited to simple vs intermediate vs complex laceration, tendon injury, nerve injury, retained foreign body, fracture, dislocation, wound infection, skin tear, abrasion, ICH/SAH, concussion. This is most consistent with an acute complicated illness  Initial Plan:  Tdap booster given during today's visit Extensive irrigation performed  Pain management as needed Laceration repair/foreign body removal as indicated CT MXF/brain/C-spine to evaluate for traumatic injuries Objective evaluation as reviewed   Initial Study Results:   Radiology:  All images reviewed independently. Agree with radiology report at this time.   CT Maxillofacial Wo Contrast  Result Date: 07/01/2022 CLINICAL DATA:  Facial trauma, blunt; Head trauma, moderate-severe; Neck trauma (Age >= 65y) EXAM: CT HEAD WITHOUT CONTRAST CT MAXILLOFACIAL WITHOUT CONTRAST CT CERVICAL SPINE WITHOUT CONTRAST TECHNIQUE: Multidetector CT imaging of the head, cervical spine, and maxillofacial structures were performed using the standard protocol without intravenous contrast. Multiplanar CT image reconstructions of the cervical spine and maxillofacial structures were also generated. RADIATION DOSE REDUCTION: This exam was performed according to the departmental dose-optimization program which includes automated exposure control, adjustment of the mA and/or kV according to patient size and/or use of iterative reconstruction technique. COMPARISON:  None Available. FINDINGS: CT HEAD FINDINGS Brain: No  evidence of acute infarction, hemorrhage, hydrocephalus, extra-axial collection or mass lesion/mass effect. Vascular: Calcific atherosclerosis. Skull: No acute fracture. Other: No mastoid effusions. CT MAXILLOFACIAL FINDINGS Osseous: No fracture or mandibular dislocation. No destructive process. Orbits: Negative. No traumatic or inflammatory finding. Sinuses: Clear. Soft tissues: Right forehead contusion/laceration. CT CERVICAL SPINE FINDINGS Alignment: Mild straightening.  No substantial sagittal subluxation. Skull base and vertebrae: Vertebral body heights are maintained. No evidence of acute fracture. Soft tissues and spinal canal: No prevertebral fluid or swelling. No visible canal hematoma. Disc levels: Multilevel facet and uncovertebral hypertrophy with varying degrees of neural foraminal stenosis. Upper chest: Visualized lung apices are clear. IMPRESSION: 1. No evidence of acute intracranial abnormality or facial fracture. Right forehead contusion/laceration. 2. No evidence of acute fracture or traumatic malalignment in the cervical spine. Electronically Signed   By: Feliberto Harts M.D.   On: 07/01/2022 17:07   CT Head Wo Contrast  Result Date: 07/01/2022 CLINICAL DATA:  Facial trauma, blunt; Head trauma, moderate-severe; Neck trauma (Age >= 65y) EXAM: CT HEAD WITHOUT CONTRAST CT MAXILLOFACIAL WITHOUT CONTRAST CT CERVICAL SPINE WITHOUT CONTRAST TECHNIQUE: Multidetector CT imaging of the head, cervical spine, and  maxillofacial structures were performed using the standard protocol without intravenous contrast. Multiplanar CT image reconstructions of the cervical spine and maxillofacial structures were also generated. RADIATION DOSE REDUCTION: This exam was performed according to the departmental dose-optimization program which includes automated exposure control, adjustment of the mA and/or kV according to patient size and/or use of iterative reconstruction technique. COMPARISON:  None Available.  FINDINGS: CT HEAD FINDINGS Brain: No evidence of acute infarction, hemorrhage, hydrocephalus, extra-axial collection or mass lesion/mass effect. Vascular: Calcific atherosclerosis. Skull: No acute fracture. Other: No mastoid effusions. CT MAXILLOFACIAL FINDINGS Osseous: No fracture or mandibular dislocation. No destructive process. Orbits: Negative. No traumatic or inflammatory finding. Sinuses: Clear. Soft tissues: Right forehead contusion/laceration. CT CERVICAL SPINE FINDINGS Alignment: Mild straightening.  No substantial sagittal subluxation. Skull base and vertebrae: Vertebral body heights are maintained. No evidence of acute fracture. Soft tissues and spinal canal: No prevertebral fluid or swelling. No visible canal hematoma. Disc levels: Multilevel facet and uncovertebral hypertrophy with varying degrees of neural foraminal stenosis. Upper chest: Visualized lung apices are clear. IMPRESSION: 1. No evidence of acute intracranial abnormality or facial fracture. Right forehead contusion/laceration. 2. No evidence of acute fracture or traumatic malalignment in the cervical spine. Electronically Signed   By: Feliberto Harts M.D.   On: 07/01/2022 17:07   CT Cervical Spine Wo Contrast  Result Date: 07/01/2022 CLINICAL DATA:  Facial trauma, blunt; Head trauma, moderate-severe; Neck trauma (Age >= 65y) EXAM: CT HEAD WITHOUT CONTRAST CT MAXILLOFACIAL WITHOUT CONTRAST CT CERVICAL SPINE WITHOUT CONTRAST TECHNIQUE: Multidetector CT imaging of the head, cervical spine, and maxillofacial structures were performed using the standard protocol without intravenous contrast. Multiplanar CT image reconstructions of the cervical spine and maxillofacial structures were also generated. RADIATION DOSE REDUCTION: This exam was performed according to the departmental dose-optimization program which includes automated exposure control, adjustment of the mA and/or kV according to patient size and/or use of iterative reconstruction  technique. COMPARISON:  None Available. FINDINGS: CT HEAD FINDINGS Brain: No evidence of acute infarction, hemorrhage, hydrocephalus, extra-axial collection or mass lesion/mass effect. Vascular: Calcific atherosclerosis. Skull: No acute fracture. Other: No mastoid effusions. CT MAXILLOFACIAL FINDINGS Osseous: No fracture or mandibular dislocation. No destructive process. Orbits: Negative. No traumatic or inflammatory finding. Sinuses: Clear. Soft tissues: Right forehead contusion/laceration. CT CERVICAL SPINE FINDINGS Alignment: Mild straightening.  No substantial sagittal subluxation. Skull base and vertebrae: Vertebral body heights are maintained. No evidence of acute fracture. Soft tissues and spinal canal: No prevertebral fluid or swelling. No visible canal hematoma. Disc levels: Multilevel facet and uncovertebral hypertrophy with varying degrees of neural foraminal stenosis. Upper chest: Visualized lung apices are clear. IMPRESSION: 1. No evidence of acute intracranial abnormality or facial fracture. Right forehead contusion/laceration. 2. No evidence of acute fracture or traumatic malalignment in the cervical spine. Electronically Signed   By: Feliberto Harts M.D.   On: 07/01/2022 17:07   Final Assessment and Plan:   77 year old male presents to ED for evaluation after mechanical fall with large facial laceration, multiple skin injuries as above. Neurologically intact. No anticoagulant use. Possible minor LOC but ambulatory with ease, at baseline, and essentially asymptomatic since immediately following fall. Sent from [facility] for further management. CT scans obtained to assess for traumatic injury and without fracture, bleed, other emergent findings. Dose of morphine ordered for pain control. TDAP updated. Laceration occurred < 8 hours prior to repair as above. Other wounds cleansed and dressed by nursing staff. Foreign body removal as above as well. Pt has no co  morbidities to affect normal wound healing but  with some possibility of retained foreign body will start on Keflex and encourage close outpatient follow up. Discussed suture home care with pt/wife and answered questions. Pt to follow up for wound check and suture removal in 5-7 days or earlier for any concerns. Pt is hemodynamically stable with no complaints prior to discharge. Strict ED return precautions given for wound complications, infection, recurrent injury, etc.    Of note, at conclusion of procedure provider who signed this note did sustain accidental needlestick injury with suture needle. This was due to accidentally dropping suture needle while disposing of sharps and no patient contact was made after injury. Charge nurse alerted as well as pt/wife. Pt agreeable to testing for communicable diseases and note made in order communications that pt should not be billed for these services.   Clinical Impression:  1. Facial laceration, initial encounter   2. Injury of head, initial encounter   3. Fall, initial encounter      Discharge           Final Clinical Impression(s) / ED Diagnoses Final diagnoses:  Facial laceration, initial encounter  Injury of head, initial encounter  Fall, initial encounter    Rx / DC Orders ED Discharge Orders          Ordered    cephALEXin (KEFLEX) 500 MG capsule  3 times daily        07/01/22 2104              Tonette Lederer, PA-C 07/02/22 0100    Tonette Lederer, PA-C 07/02/22 0101    Gerhard Munch, MD 07/06/22 1046

## 2022-07-01 NOTE — Discharge Instructions (Signed)
Thank you for letting us take care of you today.  Your scans were negative. We removed the splinter from your left hand. Please soak it to remove any remaining foreign body for 10-15 minutes 2-3 times daily over the next few days and keep hand bandaged to prevent infection.  We placed 7 sutures to the laceration on your forehead. These will need to be removed in 5-7 days. We also placed dermabond over it to help prevent bleeding and close the wound better. Once this dries do not pick it off. It should fall off on its own. You may shower as normal but do not let wounds stay moist for extended periods of time. Bandage them if they may be exposed to dirt.   I prescribed Keflex to help prevent any infection. Take this as prescribed. Take over the counter medication as needed for pain.  For any new or worsening symptoms, new injury, or signs of infection, please return to nearest emergency department immediately for re-evaluation.

## 2022-07-02 ENCOUNTER — Telehealth: Payer: Self-pay

## 2022-07-02 LAB — HEPATITIS PANEL, ACUTE
HCV Ab: NONREACTIVE
Hep A IgM: NONREACTIVE
Hep B C IgM: NONREACTIVE
Hepatitis B Surface Ag: NONREACTIVE

## 2022-07-02 NOTE — Transitions of Care (Post Inpatient/ED Visit) (Signed)
   07/02/2022  Name: Edwin Mckee. MRN: [id number] DOB: 01/18/1946  Today's TOC FU Call Status: Today's TOC FU Call Status:: Successful TOC FU Call Competed TOC FU Call Complete Date: 07/02/22  Transition Care Management Follow-up Telephone Call Date of Discharge: 07/01/22 Discharge Facility: [facility]) Type of Discharge: Emergency Department Reason for ED Visit: Other: (laceration) How have you been since you were released from the hospital?: Same Any questions or concerns?: No  Items Reviewed: Did you receive and understand the discharge instructions provided?: Yes Medications obtained and verified?: Yes (Medications Reviewed) Any new allergies since your discharge?: No Dietary orders reviewed?: NA Do you have support at home?: Yes People in Home: spouse  Home Care and Equipment/Supplies: Were Home Health Services Ordered?: NA Any new equipment or medical supplies ordered?: NA  Functional Questionnaire: Do you need assistance with bathing/showering or dressing?: No Do you need assistance with meal preparation?: No Do you need assistance with eating?: No Do you have difficulty maintaining continence: No Do you need assistance with getting out of bed/getting out of a chair/moving?: No Do you have difficulty managing or taking your medications?: No  Follow up appointments reviewed: PCP Follow-up appointment confirmed?: No (declined appt) MD Provider Line Number:[phone] Given: No Specialist Hospital Follow-up appointment confirmed?: NA Do you need transportation to your follow-up appointment?: No Do you understand care options if your condition(s) worsen?: Yes-patient verbalized understanding    SIGNATURE Karena Addison, LPN [facility] Nurse Health Advisor Direct Dial [phone]

## 2022-07-08 ENCOUNTER — Encounter: Payer: Self-pay | Admitting: Cardiology

## 2022-07-08 ENCOUNTER — Other Ambulatory Visit: Payer: Self-pay | Admitting: Emergency Medicine

## 2022-07-08 ENCOUNTER — Ambulatory Visit: Payer: Federal, State, Local not specified - PPO | Admitting: Cardiology

## 2022-07-08 ENCOUNTER — Encounter: Payer: Self-pay | Admitting: Emergency Medicine

## 2022-07-08 ENCOUNTER — Ambulatory Visit (INDEPENDENT_AMBULATORY_CARE_PROVIDER_SITE_OTHER): Payer: Federal, State, Local not specified - PPO

## 2022-07-08 ENCOUNTER — Ambulatory Visit: Payer: Federal, State, Local not specified - PPO | Admitting: Emergency Medicine

## 2022-07-08 VITALS — BP 122/69 | HR 86 | Ht 70.0 in | Wt 165.0 lb

## 2022-07-08 VITALS — BP 130/82 | HR 67 | Temp 98.1°F | Ht 70.0 in | Wt 167.0 lb

## 2022-07-08 DIAGNOSIS — Z4802 Encounter for removal of sutures: Secondary | ICD-10-CM

## 2022-07-08 DIAGNOSIS — S0181XA Laceration without foreign body of other part of head, initial encounter: Secondary | ICD-10-CM | POA: Diagnosis not present

## 2022-07-08 DIAGNOSIS — I25118 Atherosclerotic heart disease of native coronary artery with other forms of angina pectoris: Secondary | ICD-10-CM

## 2022-07-08 DIAGNOSIS — S60222A Contusion of left hand, initial encounter: Secondary | ICD-10-CM

## 2022-07-08 DIAGNOSIS — S40012A Contusion of left shoulder, initial encounter: Secondary | ICD-10-CM | POA: Diagnosis not present

## 2022-07-08 DIAGNOSIS — I10 Essential (primary) hypertension: Secondary | ICD-10-CM

## 2022-07-08 DIAGNOSIS — E7800 Pure hypercholesterolemia, unspecified: Secondary | ICD-10-CM

## 2022-07-08 DIAGNOSIS — R5383 Other fatigue: Secondary | ICD-10-CM

## 2022-07-08 MED ORDER — ATORVASTATIN CALCIUM 40 MG PO TABS: 40.0000 mg | ORAL_TABLET | Freq: Every day | ORAL | 3 refills | Status: DC

## 2022-07-08 NOTE — Assessment & Plan Note (Signed)
With palpable and visible small hematoma.  No complications. No concerns.

## 2022-07-08 NOTE — Progress Notes (Signed)
Edwin Mckee. 77 y.o.   Chief Complaint  Patient presents with   Follow-up    Facial laceration, patient fell seen in ED on 4/17, suture removal     HISTORY OF PRESENT ILLNESS: This is a 77 y.o. adult here for follow-up of emergency department visit on 07/01/2022 when he fell and sustained facial laceration as well as multiple contusions Here for suture removal Also complaining of pain to left hand and left scapular area.  Wife noticed a bump on the left scapular area No other complaints or medical concerns today.  HPI   Prior to Admission medications   Medication Sig Start Date End Date Taking? Authorizing Provider  acetaminophen (TYLENOL) 500 MG tablet Take 1,000 mg by mouth every 8 (eight) hours as needed for moderate pain.   Yes [provider]  aspirin (ASPIRIN CHILDRENS) 81 MG chewable tablet Chew 1 tablet (81 mg total) by mouth daily. 10/15/21  Yes Yates Decamp, MD  chlorthalidone (HYGROTON) 25 MG tablet Take 0.5 tablets (12.5 mg total) by mouth every morning. 05/27/22  Yes Yates Decamp, MD  diltiazem (CARDIZEM CD) 120 MG 24 hr capsule Take 1 capsule (120 mg total) by mouth every evening. 10/15/21 10/10/22 Yes Yates Decamp, MD  valsartan (DIOVAN) 320 MG tablet Take 1 tablet (320 mg total) by mouth daily. 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   Atrial flutter with rapid ventricular response    Long term (current) use of anticoagulants    Benign hypertension    Mixed hyperlipidemia    Atherosclerosis of native coronary artery of native heart without angina pectoris    History of coronary artery stent placement    IHD (ischemic heart disease) 12/16/2016   History of MI (myocardial infarction) 12/16/2016   History of depression 12/16/2016   Hearing loss 05/04/2012   HYPERLIPIDEMIA 01/26/2008   Essential hypertension 01/26/2008   INGUINAL HERNIA, HX OF 01/26/2008     Past Medical History:  Diagnosis Date   Arthritis    Depression    Depression    Phreesia 01/03/2020   Hyperlipidemia    controlled with meds    Hypertension    NSTEMI (non-ST elevated myocardial infarction) 04/25/2014   Postmenopausal    Substance abuse     Past Surgical History:  Procedure Laterality Date   A-FLUTTER ABLATION N/A 02/24/2021   Procedure: A-FLUTTER ABLATION;  Surgeon: Lanier Prude, MD;  Location: [facility] INVASIVE CV LAB;  Service: Cardiovascular;  Laterality: N/A;   COLONOSCOPY     HERNIA REPAIR     LEFT HEART CATHETERIZATION WITH CORONARY ANGIOGRAM N/A 04/25/2014   Procedure: LEFT HEART CATHETERIZATION WITH CORONARY ANGIOGRAM;  Surgeon: Pamella Pert, MD;  Location: [facility] CATH LAB;  Service: Cardiovascular;  Laterality: N/A; with stents placement    POLYPECTOMY     VASECTOMY      Social History   Socioeconomic History   Marital status: Married    Spouse name: Not on file   Number of children: 2   Years of education: Not on file   Highest education level: 12th grade  Occupational History   Not on file  Tobacco Use   Smoking status: Former    Packs/day: 0.50    Years: 10.00    Additional pack years: 0.00    Total pack years: 5.00    Types: Cigarettes    Quit date: 04/12/1982  Years since quitting: 40.2   Smokeless tobacco: Never  Vaping Use   Vaping Use: Never used  Substance and Sexual Activity   Alcohol use: No    Alcohol/week: 0.0 standard drinks of alcohol   Drug use: No   Sexual activity: Yes  Other Topics Concern   Not on file  Social History Narrative   Not on file   Social Determinants of Health   Financial Resource Strain: Low Risk  (07/06/2022)   Overall Financial Resource Strain (CARDIA)    Difficulty of Paying Living Expenses: Not hard at all  Food Insecurity: No Food Insecurity (07/06/2022)   Hunger Vital Sign    Worried About Running Out of Food in the Last Year: Never true    Ran Out of Food in the Last Year: Never  true  Transportation Needs: No Transportation Needs (07/06/2022)   PRAPARE - Administrator, Civil Service (Medical): No    Lack of Transportation (Non-Medical): No  Physical Activity: Insufficiently Active (07/06/2022)   Exercise Vital Sign    Days of Exercise per Week: 3 days    Minutes of Exercise per Session: 30 min  Stress: No Stress Concern Present (07/06/2022)   [employer] of Occupational Health - Occupational Stress Questionnaire    Feeling of Stress : Only a little  Social Connections: Moderately Integrated (07/06/2022)   Social Connection and Isolation Panel [NHANES]    Frequency of Communication with Friends and Family: Once a week    Frequency of Social Gatherings with Friends and Family: Once a week    Attends Religious Services: More than 4 times per year    Active Member of [employer] or Organizations: Yes    Attends Engineer, structural: More than 4 times per year    Marital Status: Married  Catering manager Violence: Not on file    Family History  Problem Relation Age of Onset   Cancer Mother        lung   Hypertension Father    Alzheimer's disease Father    Heart disease Sister    Heart disease Brother    Stroke Maternal Grandmother    Alcohol abuse Maternal Grandfather    Cancer Maternal Grandfather    Stroke Paternal Grandmother    Cancer Paternal Grandfather    Colon cancer Neg Hx    Colon polyps Neg Hx    Esophageal cancer Neg Hx    Rectal cancer Neg Hx    Stomach cancer Neg Hx      Review of Systems  Constitutional: Negative.  Negative for chills and fever.  HENT: Negative.  Negative for congestion and sore throat.   Respiratory: Negative.  Negative for cough and shortness of breath.   Cardiovascular: Negative.  Negative for chest pain and palpitations.  Gastrointestinal:  Negative for abdominal pain, diarrhea, nausea and vomiting.  Genitourinary: Negative.  Negative for dysuria and hematuria.  Skin: Negative.  Negative for  rash.  Neurological: Negative.  Negative for dizziness and headaches.  All other systems reviewed and are negative.   Vitals:   07/08/22 0818  BP: 130/82  Pulse: 67  Temp: 98.1 F (36.7 C)  SpO2: 98%    Physical Exam Vitals reviewed.  Constitutional:      Appearance: Normal appearance.  HENT:     Head: Normocephalic.  Eyes:     Extraocular Movements: Extraocular movements intact.     Pupils: Pupils are equal, round, and reactive to light.  Cardiovascular:  Rate and Rhythm: Normal rate.  Pulmonary:     Effort: Pulmonary effort is normal.  Musculoskeletal:     Comments: Small hematoma to left scapular area Left hand: Positive tenderness and mild swelling to fifth MCP joint area  Skin:    General: Skin is warm and dry.     Comments: Scabbed over laceration to right periorbital area Visible sutures.  7 of them. Dermabond was also used over the sutures Healing well No signs of infection  Neurological:     Mental Status: He is alert and oriented to person, place, and time.  Psychiatric:        Mood and Affect: Mood normal.        Behavior: Behavior normal.    Procedure note: After obtaining verbal consent from patient, 7 sutures were removed.  Area cleansed with Betadine and saline solution.  Dermabond applied. Tolerated procedure well.  No complications.  DG Hand 2 View Left  Result Date: 07/08/2022 CLINICAL DATA:  Recent fall with left hand pain, initial encounter EXAM: LEFT HAND - 2 VIEW COMPARISON:  None Available. FINDINGS: Considerable degenerative changes are noted the first [facility] joint. Mild deformity of the distal radius is noted consistent with prior fracture and healing. No acute fracture or dislocation is noted. Metallic foreign body is noted adjacent to the first metacarpal bone. No other soft tissue abnormality is noted. IMPRESSION: Significant degenerative changes at the first [facility] joint without acute fracture. Changes consistent with healed distal radial  fracture. Electronically Signed   By: Alcide Clever M.D.   On: 07/08/2022 09:32     ASSESSMENT & PLAN: Problem List Items Addressed This Visit       Other   Facial laceration - Primary    Healing well.  No signs of infection. Sutures removed.  Some wound dehiscence noted Dermabond applied Wound care instructions given.      Contusion of left hand    Minimal swelling and tenderness. X-ray done today.  Report reviewed Not affecting function of hand      Relevant Orders   DG Hand 2 View Left   Contusion of left scapular region    With palpable and visible small hematoma.  No complications. No concerns.      Other Visit Diagnoses     Visit for suture removal          Patient Instructions  Tissue Adhesive Wound Care Some cuts and wounds can be closed with skin glue (tissue adhesive). Skin glue holds the skin together and helps your wound heal faster. Skin glue goes away on its own as your wound gets better. It is important to take good care of your wound while it heals. Follow these instructions at home: Wound care     If a bandage (dressing) was put on the wound, keep it clean and dry. Follow instructions from your doctor about how often to change the bandage. Make sure you: Wash your hands with soap and water for at least 20 seconds before and after you change your bandage. If you cannot use soap and water, use hand sanitizer. Change the bandage as often as told by your doctor. Leave skin glue in place. It will fall off on its own after 7-10 days. Do not scratch, rub, or pick at the skin glue. Do not put tape over the skin glue. The skin glue could come off when you take the tape off. Check your wound daily to make sure it is not starting to reopen.  Protect the wound from another injury. Check your wound area every day for signs of infection. Check for: More redness, swelling, or pain. Fluid or blood. Warmth or a rash around the wound. Pus or a bad  smell. Bathing Do not take baths, swim, or use a hot tub. Ask your doctor about taking showers or sponge baths. You can usually shower after the first 24 hours. Cover the bandage with a watertight covering when you take a shower. Do not soak the area where skin glue has been used. Do not use soaps or put creams on your wound. Eating and drinking Eat healthy foods to help the wound heal. As told by your doctor, eat: Foods rich in protein. These include meat, fish, eggs, dairy, beans, and nuts. Foods rich in vitamin A. These include carrots and dark green, leafy vegetables. Foods rich in vitamin C. These include oranges, tomatoes, broccoli, and peppers. Drink enough fluid to keep your pee (urine) pale yellow. General instructions Protect your wound from the sun when you are outside for the first 6 months, or for as long as told by your doctor. Put on sunscreen with an SPF of 30 or higher around the scar, or cover it up. Take over-the-counter and prescription medicines only as told by your doctor. Do not smoke or use any products that contain nicotine or tobacco. These can delay wound healing. If you need help quitting, ask your doctor. Keep all follow-up visits. Contact a doctor if: The glue used on your wound gets soaked with blood or falls off before your wound has healed. The glue may need to be replaced. You have a fever or chills. You have redness, swelling, or pain around your wound. You have fluid or blood coming from your wound. You get a rash after the glue is put on. Get help right away if: Your wound breaks open. You have a red streak at the area around your wound. You have pus or a bad smell coming from your wound. Summary Some cuts and wounds can be closed with skin glue (tissue adhesive). Skin glue holds the skin together and helps your wound heal faster. It is important to take good care of your wound while it heals. Wash your hands with soap and water for at least 20  seconds before and after you change your bandage. Eat healthy foods. Check your wound every day for signs of infection. This information is not intended to replace advice given to you by your health care provider. Make sure you discuss any questions you have with your health care provider. Document Revised: 07/08/2020 Document Reviewed: 07/08/2020 Elsevier Patient Education  2023 Elsevier Inc.      Edwina Barth, MD [facility]

## 2022-07-08 NOTE — Patient Instructions (Signed)
Tissue Adhesive Wound Care Some cuts and wounds can be closed with skin glue (tissue adhesive). Skin glue holds the skin together and helps your wound heal faster. Skin glue goes away on its own as your wound gets better. It is important to take good care of your wound while it heals. Follow these instructions at home: Wound care     If a bandage (dressing) was put on the wound, keep it clean and dry. Follow instructions from your doctor about how often to change the bandage. Make sure you: Wash your hands with soap and water for at least 20 seconds before and after you change your bandage. If you cannot use soap and water, use hand sanitizer. Change the bandage as often as told by your doctor. Leave skin glue in place. It will fall off on its own after 7-10 days. Do not scratch, rub, or pick at the skin glue. Do not put tape over the skin glue. The skin glue could come off when you take the tape off. Check your wound daily to make sure it is not starting to reopen. Protect the wound from another injury. Check your wound area every day for signs of infection. Check for: More redness, swelling, or pain. Fluid or blood. Warmth or a rash around the wound. Pus or a bad smell. Bathing Do not take baths, swim, or use a hot tub. Ask your doctor about taking showers or sponge baths. You can usually shower after the first 24 hours. Cover the bandage with a watertight covering when you take a shower. Do not soak the area where skin glue has been used. Do not use soaps or put creams on your wound. Eating and drinking Eat healthy foods to help the wound heal. As told by your doctor, eat: Foods rich in protein. These include meat, fish, eggs, dairy, beans, and nuts. Foods rich in vitamin A. These include carrots and dark green, leafy vegetables. Foods rich in vitamin C. These include oranges, tomatoes, broccoli, and peppers. Drink enough fluid to keep your pee (urine) pale yellow. General  instructions Protect your wound from the sun when you are outside for the first 6 months, or for as long as told by your doctor. Put on sunscreen with an SPF of 30 or higher around the scar, or cover it up. Take over-the-counter and prescription medicines only as told by your doctor. Do not smoke or use any products that contain nicotine or tobacco. These can delay wound healing. If you need help quitting, ask your doctor. Keep all follow-up visits. Contact a doctor if: The glue used on your wound gets soaked with blood or falls off before your wound has healed. The glue may need to be replaced. You have a fever or chills. You have redness, swelling, or pain around your wound. You have fluid or blood coming from your wound. You get a rash after the glue is put on. Get help right away if: Your wound breaks open. You have a red streak at the area around your wound. You have pus or a bad smell coming from your wound. Summary Some cuts and wounds can be closed with skin glue (tissue adhesive). Skin glue holds the skin together and helps your wound heal faster. It is important to take good care of your wound while it heals. Wash your hands with soap and water for at least 20 seconds before and after you change your bandage. Eat healthy foods. Check your wound every day for signs  of infection. This information is not intended to replace advice given to you by your health care provider. Make sure you discuss any questions you have with your health care provider. Document Revised: 07/08/2020 Document Reviewed: 07/08/2020 Elsevier Patient Education  2023 [employer].

## 2022-07-08 NOTE — Progress Notes (Signed)
Primary Physician/Referring:  Georgina Quint, MD  Patient ID: Edwin Mckee., adult    DOB: Jun 17, 1945, 77 y.o.   MRN: [id number]  Chief Complaint  Patient presents with   Coronary artery disease of native artery of native heart wi   Follow-up   HPI:    Edwin Mckee.  is a 77 y.o. Caucasian male with coronary artery disease with history of non-STEMI  04/25/2014 S/P stenting of the proximal and mid LAD and balloon angioplasty of the D1, mild diffuse disease in other vessels, LVEF 50-55%. He also has hypertension, hyperglycemia and mild hyperlipidemia.  Due to typical atrial flutter, underwent atrial flutter ablation on 02/24/2021.  His anticoagulation has been discontinued since March 2023 by EP.  He had presented with fatigue and dyspnea, which could have been anginal equivalent.  Hence I sent him up for exercise nuclear stress test, also held his statins to exclude statin induced myalgias and fatigue.  Patient states that since last office visit 6 to 8 weeks ago, he has had no change in his symptoms.  Past Medical History:  Diagnosis Date   Arthritis    Depression    Depression    Phreesia 01/03/2020   Hyperlipidemia    controlled with meds    Hypertension    NSTEMI (non-ST elevated myocardial infarction) 04/25/2014   Postmenopausal    Substance abuse    Past Surgical History:  Procedure Laterality Date   A-FLUTTER ABLATION N/A 02/24/2021   Procedure: A-FLUTTER ABLATION;  Surgeon: Lanier Prude, MD;  Location: [facility] INVASIVE CV LAB;  Service: Cardiovascular;  Laterality: N/A;   COLONOSCOPY     HERNIA REPAIR     LEFT HEART CATHETERIZATION WITH CORONARY ANGIOGRAM N/A 04/25/2014   Procedure: LEFT HEART CATHETERIZATION WITH CORONARY ANGIOGRAM;  Surgeon: Pamella Pert, MD;  Location: [facility] CATH LAB;  Service: Cardiovascular;  Laterality: N/A; with stents placement    POLYPECTOMY     VASECTOMY     Social History   Tobacco Use   Smoking status: Former     Packs/day: 0.50    Years: 10.00    Additional pack years: 0.00    Total pack years: 5.00    Types: Cigarettes    Quit date: 04/12/1982    Years since quitting: 40.2   Smokeless tobacco: Never  Substance Use Topics   Alcohol use: No    Alcohol/week: 0.0 standard drinks of alcohol  Marital Status: Married   ROS  Review of Systems  Cardiovascular:  Positive for chest pain and dyspnea on exertion. Negative for leg swelling.   Objective      07/08/2022    2:58 PM 07/08/2022    8:18 AM 07/01/2022    7:30 PM  Vitals with BMI  Height 5\' 10"  5\' 10"    Weight 165 lbs 167 lbs   BMI 23.68 23.96   Systolic 122 130 409  Diastolic 69 82 67  Pulse 86 67 82    Physical Exam Neck:     Vascular: No carotid bruit or JVD.  Cardiovascular:     Rate and Rhythm: Normal rate and regular rhythm.     Pulses: Normal pulses and intact distal pulses.     Heart sounds:     No gallop.  Pulmonary:     Effort: Pulmonary effort is normal.     Breath sounds: Normal breath sounds.  Abdominal:     General: Bowel sounds are normal.     Palpations: Abdomen is soft.  Musculoskeletal:     Right lower leg: No edema.     Left lower leg: No edema.    Laboratory examination:   Lab Results  Component Value Date   NA 141 04/09/2022   K 4.2 04/09/2022   CO2 28 04/09/2022   GLUCOSE 120 (H) 04/09/2022   BUN 15 04/09/2022   CREATININE 1.26 04/09/2022   CALCIUM 9.4 04/09/2022   EGFR 65 11/10/2021   GFRNONAA 58 (L) 12/19/2020       Latest Ref Rng & Units 04/09/2022   10:30 AM 11/10/2021    1:58 PM 04/02/2021   10:12 AM  CMP  Glucose 70 - 99 mg/dL 161  91  096   BUN 6 - 23 mg/dL 15  16  16    Creatinine 0.40 - 1.50 mg/dL 0.45  4.09  8.11   Sodium 135 - 145 mEq/L 141  140  141   Potassium 3.5 - 5.1 mEq/L 4.2  4.5  4.9   Chloride 96 - 112 mEq/L 105  102  106   CO2 19 - 32 mEq/L 28  26  28    Calcium 8.4 - 10.5 mg/dL 9.4  9.7  9.5   Total Protein 6.0 - 8.3 g/dL 6.1   6.6   Total Bilirubin 0.2 - 1.2 mg/dL  0.4   0.5   Alkaline Phos 39 - 117 U/L 58   60   AST 0 - 37 U/L 19   22   ALT 0 - 53 U/L 15   21       Latest Ref Rng & Units 04/09/2022   10:30 AM 04/02/2021   10:12 AM 02/17/2021    9:41 AM  CBC  WBC 4.0 - 10.5 K/uL 6.6  6.3  7.4   Hemoglobin 13.0 - 17.0 g/dL 91.4  78.2  95.6   Hematocrit 39.0 - 52.0 % 39.2  43.3  37.7   Platelets 150.0 - 400.0 K/uL 235.0  208.0  231    Lipid Panel Recent Labs    04/09/22 1030  CHOL 139  TRIG 156.0*  LDLCALC 65  VLDL 31.2  HDL 42.20  CHOLHDL 3    HEMOGLOBIN A1C Lab Results  Component Value Date   HGBA1C 6.0 04/09/2022   Lab Results  Component Value Date   TSH 3.130 01/04/2020    Radiology:  DG Hand 2 View Left  Result Date: 07/08/2022 CLINICAL DATA:  Recent fall with left hand pain, initial encounter EXAM: LEFT HAND - 2 VIEW COMPARISON:  None Available. FINDINGS: Considerable degenerative changes are noted the first [facility] joint. Mild deformity of the distal radius is noted consistent with prior fracture and healing. No acute fracture or dislocation is noted. Metallic foreign body is noted adjacent to the first metacarpal bone. No other soft tissue abnormality is noted. IMPRESSION: Significant degenerative changes at the first [facility] joint without acute fracture. Changes consistent with healed distal radial fracture. Electronically Signed   By: Alcide Clever M.D.   On: 07/08/2022 09:32    Cardiac Studies:   Coronary Angiography 04/25/2014:  PTCA and stenting of the proximal and mid LAD with implantation of a 3.0 x 38 mm resolute DES and balloon angioplasty of the D1 with 2.5 x 15 mm balloon.  Mild disease otherwise.  LVEF 50-55%  Exercise sestamibi stress test 07/20/2016: 1. The resting electrocardiogram demonstrated normal sinus rhythm, RBBB and no resting arrhythmias.  The stress electrocardiogram was normal.  Occasional PVC. Patient exercised on Bruce protocol for 7:30 minutes and  achieved 9.34 METS. Stress test terminated due to fatigue  and 95% MPHR achieved (Target HR >85%).  2. The LV is dilated both at rest and stress images. The LV end diastolic volume was  . REST and STRESS images demonstrate decreased tracer uptake in the mid inferoseptal and apical septal segments of the left ventricle.  These defects are related to diaphragmatic attenuation.  The left ventricular ejection fraction was calculated to be 48% bu visually appears to be normal.  This is a low risk study.  PCV ECHOCARDIOGRAM COMPLETE 09/18/2020 Left ventricle cavity is normal in size and wall thickness. Normal global wall motion. Normal LV systolic function with EF 66%. Doppler evidence of grade I (impaired) diastolic dysfunction, normal LAP. No significant valvular abnormality. Previous study in 2018 had noted normal diastolic function.   Ambulatory cardiac telemetry 08/27/2020 - 09/10/2020: Predominant underlying rhythm was sinus with first-degree AV block and bundle branch block.  Heart rate 43-175 bpm, average heart rate 75 bpm.  7 episodes of supraventricular tachycardia longest lasting 19 beats.  PACs and PVCs were rare.  Patient's symptoms correlated with sinus rhythm and PACs.  No evidence of A. fib, high degree AV block, VT, or pauses >3 seconds.  Atrial flutter ablation 02/24/2021:  1. Isthmus-dependent counter clockwise right atrial flutter.   2. Successful radiofrequency ablation of atrial flutter along the cavotricuspid isthmus with complete bidirectional isthmus block achieved.   3. No inducible arrhythmias following ablation.   4. No early apparent complications.   Exercise nuclear stress test 06/25/2022: There is a small fixed mild defect in the septal and apical regions.  Overall function is abnormal with mild septal hypokinesis. Stress LV EF: 47%.  Abnormal ECG stress. The patient exercised for 8 minutes and 0 seconds of a Bruce protocol, achieving approximately 10.14 METs & 90% MPHR. No chest pain. Stress terminated due to THR achieved. The  blood pressure response was normal. Compared to previous study: 10/21/2020 mild perfusion abnormality is new. Previously calculated LVEF is 48% and visually appeared to be normal. Intermediate risk study.   EKG:    EKG 05/27/2022: Normal sinus rhythm heart rate of 62 bpm, normal axis.  Right bundle branch block.  Compared to 10/15/2021, no significant change.  Allergies  No Known Allergies   Medications    Current Outpatient Medications:    acetaminophen (TYLENOL) 500 MG tablet, Take 1,000 mg by mouth every 8 (eight) hours as needed for moderate pain., Disp: , Rfl:    aspirin (ASPIRIN CHILDRENS) 81 MG chewable tablet, Chew 1 tablet (81 mg total) by mouth daily., Disp: , Rfl:    chlorthalidone (HYGROTON) 25 MG tablet, Take 0.5 tablets (12.5 mg total) by mouth every morning., Disp: 90 tablet, Rfl: 3   diltiazem (CARDIZEM CD) 120 MG 24 hr capsule, Take 1 capsule (120 mg total) by mouth every evening., Disp: 90 capsule, Rfl: 3   valsartan (DIOVAN) 320 MG tablet, Take 1 tablet (320 mg total) by mouth daily., Disp: 90 tablet, Rfl: 3   atorvastatin (LIPITOR) 40 MG tablet, Take 1 tablet (40 mg total) by mouth daily., Disp: 90 tablet, Rfl: 3   Assessment     ICD-10-CM   1. Coronary artery disease involving native coronary artery of native heart with other form of angina pectoris  I25.118 atorvastatin (LIPITOR) 40 MG tablet    2. Hypercholesteremia  E78.00 atorvastatin (LIPITOR) 40 MG tablet    3. Essential hypertension  I10      Meds ordered this encounter  Medications  atorvastatin (LIPITOR) 40 MG tablet    Sig: Take 1 tablet (40 mg total) by mouth daily.    Dispense:  90 tablet    Refill:  3   Recommendations:   Draxton Luu.  is a 77 y.o. Caucasian male with coronary artery disease with history of non-STEMI  04/25/2014 S/P stenting of the proximal and mid LAD and balloon angioplasty of the D1, mild diffuse disease in other vessels, LVEF 50-55%. He also has hypertension,  hyperglycemia and mild hyperlipidemia.  Due to typical atrial flutter, underwent atrial flutter ablation on 02/24/2021.  His anticoagulation has been discontinued since March 2023 by EP.  1. Coronary artery disease involving native coronary artery of native heart with other form of angina pectoris He had presented with fatigue and dyspnea, which could have been anginal equivalent.  I reviewed his stress test, he has a very small fixed apical septal defect with mildly reduced LVEF, clinically low risk.  I have recommended that we evaluate him for hypogonadism to see if his fatigue and decreased exercise capacity is related to this.  I had also discontinued atorvastatin to see whether his symptoms of fatigue could be related to statin induced myalgias and fatigue and since stopping statins, no change in his symptoms, hence advised him to restart his medications.  - atorvastatin (LIPITOR) 40 MG tablet; Take 1 tablet (40 mg total) by mouth daily.  Dispense: 90 tablet; Refill: 3  2. Hypercholesteremia Lipids under excellent control with previous statin dose, continue same. - atorvastatin (LIPITOR) 40 MG tablet; Take 1 tablet (40 mg total) by mouth daily.  Dispense: 90 tablet; Refill: 3  3. Essential hypertension Blood pressure under excellent control.  He is presently on diltiazem and also chlorthalidone and valsartan, continue the same.  Office visit in 3 months and if symptoms persist will consider cardiac catheterization.     Yates Decamp, MD, [facility] 07/08/2022, 3:41 PM Office: [phone] Fax: [phone] Pager: [phone]

## 2022-07-08 NOTE — Assessment & Plan Note (Signed)
Healing well.  No signs of infection. Sutures removed.  Some wound dehiscence noted Dermabond applied Wound care instructions given.

## 2022-07-08 NOTE — Assessment & Plan Note (Signed)
Minimal swelling and tenderness. X-ray done today.  Report reviewed Not affecting function of hand

## 2022-07-20 ENCOUNTER — Ambulatory Visit: Payer: Federal, State, Local not specified - PPO | Admitting: Emergency Medicine

## 2022-07-20 ENCOUNTER — Encounter: Payer: Self-pay | Admitting: Emergency Medicine

## 2022-07-20 VITALS — BP 138/88 | HR 62 | Temp 97.9°F | Ht 70.0 in | Wt 165.0 lb

## 2022-07-20 DIAGNOSIS — R5383 Other fatigue: Secondary | ICD-10-CM | POA: Diagnosis not present

## 2022-07-20 DIAGNOSIS — I10 Essential (primary) hypertension: Secondary | ICD-10-CM | POA: Diagnosis not present

## 2022-07-20 DIAGNOSIS — R5381 Other malaise: Secondary | ICD-10-CM | POA: Insufficient documentation

## 2022-07-20 LAB — CBC WITH DIFFERENTIAL/PLATELET
Basophils Absolute: 0.1 10*3/uL (ref 0.0–0.1)
Basophils Relative: 1.1 % (ref 0.0–3.0)
Eosinophils Absolute: 0 10*3/uL (ref 0.0–0.7)
Eosinophils Relative: 0.6 % (ref 0.0–5.0)
HCT: 40.2 % (ref 39.0–52.0)
Hemoglobin: 13.5 g/dL (ref 13.0–17.0)
Lymphocytes Relative: 31 % (ref 12.0–46.0)
Lymphs Abs: 2.4 10*3/uL (ref 0.7–4.0)
MCHC: 33.6 g/dL (ref 30.0–36.0)
MCV: 87.6 fl (ref 78.0–100.0)
Monocytes Absolute: 0.4 10*3/uL (ref 0.1–1.0)
Monocytes Relative: 5.8 % (ref 3.0–12.0)
Neutro Abs: 4.7 10*3/uL (ref 1.4–7.7)
Neutrophils Relative %: 61.5 % (ref 43.0–77.0)
Platelets: 263 10*3/uL (ref 150.0–400.0)
RBC: 4.58 Mil/uL (ref 4.22–5.81)
RDW: 13.3 % (ref 11.5–15.5)
WBC: 7.7 10*3/uL (ref 4.0–10.5)

## 2022-07-20 LAB — VITAMIN B12: Vitamin B-12: 387 pg/mL (ref 211–911)

## 2022-07-20 LAB — LIPID PANEL
Cholesterol: 133 mg/dL (ref 0–200)
HDL: 46 mg/dL (ref >39.00)
LDL Cholesterol: 69 mg/dL (ref 0–99)
NonHDL: 87.24
Total CHOL/HDL Ratio: 3
Triglycerides: 91 mg/dL (ref 0.0–149.0)
VLDL: 18.2 mg/dL (ref 0.0–40.0)

## 2022-07-20 LAB — COMPREHENSIVE METABOLIC PANEL
ALT: 21 U/L (ref 0–53)
AST: 26 U/L (ref 0–37)
Albumin: 4.2 g/dL (ref 3.5–5.2)
Alkaline Phosphatase: 57 U/L (ref 39–117)
BUN: 14 mg/dL (ref 6–23)
CO2: 29 mEq/L (ref 19–32)
Calcium: 9.9 mg/dL (ref 8.4–10.5)
Chloride: 103 mEq/L (ref 96–112)
Creatinine, Ser: 1.25 mg/dL (ref 0.40–1.50)
GFR: 56.74 mL/min — ABNORMAL LOW (ref >60.00)
Glucose, Bld: 94 mg/dL (ref 70–99)
Potassium: 4.7 mEq/L (ref 3.5–5.1)
Sodium: 140 mEq/L (ref 135–145)
Total Bilirubin: 0.5 mg/dL (ref 0.2–1.2)
Total Protein: 6.7 g/dL (ref 6.0–8.3)

## 2022-07-20 LAB — VITAMIN D 25 HYDROXY (VIT D DEFICIENCY, FRACTURES): VITD: 55.61 ng/mL (ref 30.00–100.00)

## 2022-07-20 LAB — SEDIMENTATION RATE: Sed Rate: 4 mm/hr (ref 0–20)

## 2022-07-20 LAB — MAGNESIUM: Magnesium: 2 mg/dL (ref 1.5–2.5)

## 2022-07-20 LAB — TESTOSTERONE: Testosterone: 285.3 ng/dL — ABNORMAL LOW (ref 300.00–890.00)

## 2022-07-20 NOTE — Patient Instructions (Signed)
Fatigue If you have fatigue, you feel tired all the time and have a lack of energy or a lack of motivation. Fatigue may make it difficult to start or complete tasks because of exhaustion. Occasional or mild fatigue is often a normal response to activity or life. However, long-term (chronic) or extreme fatigue may be a symptom of a medical condition such as: Depression. Not having enough red blood cells or hemoglobin in the blood (anemia). A problem with a small gland located in the lower front part of the neck (thyroid disorder). Rheumatologic conditions. These are problems related to the body's defense system (immune system). Infections, especially certain viral infections. Fatigue can also lead to negative health outcomes over time. Follow these instructions at home: Medicines Take over-the-counter and prescription medicines only as told by your health care provider. Take a multivitamin if told by your health care provider. Do not use herbal or dietary supplements unless they are approved by your health care provider. Eating and drinking  Avoid heavy meals in the evening. Eat a well-balanced diet, which includes lean proteins, whole grains, plenty of fruits and vegetables, and low-fat dairy products. Avoid eating or drinking too many products with caffeine in them. Avoid alcohol. Drink enough fluid to keep your urine pale yellow. Activity  Exercise regularly, as told by your health care provider. Use or practice techniques to help you relax, such as yoga, tai chi, meditation, or massage therapy. Lifestyle Change situations that cause you stress. Try to keep your work and personal schedules in balance. Do not use recreational or illegal drugs. General instructions Monitor your fatigue for any changes. Go to bed and get up at the same time every day. Avoid fatigue by pacing yourself during the day and getting enough sleep at night. Maintain a healthy weight. Contact a health care  provider if: Your fatigue does not get better. You have a fever. You suddenly lose or gain weight. You have headaches. You have trouble falling asleep or sleeping through the night. You feel angry, guilty, anxious, or sad. You have swelling in your legs or another part of your body. Get help right away if: You feel confused, feel like you might faint, or faint. Your vision is blurry or you have a severe headache. You have severe pain in your abdomen, your back, or the area between your waist and hips (pelvis). You have chest pain, shortness of breath, or an irregular or fast heartbeat. You are unable to urinate, or you urinate less than normal. You have abnormal bleeding from the rectum, nose, lungs, nipples, or, if you are male, the vagina. You vomit blood. You have thoughts about hurting yourself or others. These symptoms may be an emergency. Get help right away. Call 911. Do not wait to see if the symptoms will go away. Do not drive yourself to the hospital. Get help right away if you feel like you may hurt yourself or others, or have thoughts about taking your own life. Go to your nearest emergency room or: Call 911. Call the National Suicide Prevention Lifeline at 1-800-273-8255 or 988. This is open 24 hours a day. Text the Crisis Text Line at 741741. Summary If you have fatigue, you feel tired all the time and have a lack of energy or a lack of motivation. Fatigue may make it difficult to start or complete tasks because of exhaustion. Long-term (chronic) or extreme fatigue may be a symptom of a medical condition. Exercise regularly, as told by your health care provider.   Change situations that cause you stress. Try to keep your work and personal schedules in balance. This information is not intended to replace advice given to you by your health care provider. Make sure you discuss any questions you have with your health care provider. Document Revised: 12/23/2020 Document  Reviewed: 12/23/2020 Elsevier Patient Education  2023 [employer]  

## 2022-07-20 NOTE — Progress Notes (Signed)
Edwin Mckee. 77 y.o.   Chief Complaint  Patient presents with   Medical Management of Chronic Issues    Wants testosterone checked and also right side of the eye checked after stiches removal     HISTORY OF PRESENT ILLNESS: This is a 77 y.o. adult complaining of general malaise and fatigue for the last couple years but worse the last several months Interested in checking testosterone levels as recommended by his cardiologist Dr. Jacinto Halim. No other complaints or medical concerns today Eats well and stays physically active. Non-smoker.  No EtOH user. Denies depression.  HPI   Prior to Admission medications   Medication Sig Start Date End Date Taking? Authorizing Provider  acetaminophen (TYLENOL) 500 MG tablet Take 1,000 mg by mouth every 8 (eight) hours as needed for moderate pain.   Yes [provider]  aspirin (ASPIRIN CHILDRENS) 81 MG chewable tablet Chew 1 tablet (81 mg total) by mouth daily. 10/15/21  Yes Yates Decamp, MD  atorvastatin (LIPITOR) 40 MG tablet Take 1 tablet (40 mg total) by mouth daily. 07/08/22  Yes Yates Decamp, MD  chlorthalidone (HYGROTON) 25 MG tablet Take 0.5 tablets (12.5 mg total) by mouth every morning. 05/27/22  Yes Yates Decamp, MD  diltiazem (CARDIZEM CD) 120 MG 24 hr capsule Take 1 capsule (120 mg total) by mouth every evening. 10/15/21 10/10/22 Yes Yates Decamp, MD  valsartan (DIOVAN) 320 MG tablet Take 1 tablet (320 mg total) by mouth daily. 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   Atrial flutter with rapid ventricular response (HCC)    Long term (current) use of anticoagulants    Benign hypertension    Mixed hyperlipidemia    Atherosclerosis of native coronary artery of native heart without angina pectoris    History of coronary  artery stent placement    IHD (ischemic heart disease) 12/16/2016   History of MI (myocardial infarction) 12/16/2016   History of depression 12/16/2016   Hearing loss 05/04/2012   HYPERLIPIDEMIA 01/26/2008   Essential hypertension 01/26/2008   INGUINAL HERNIA, HX OF 01/26/2008    Past Medical History:  Diagnosis Date   Arthritis    Depression    Depression    Phreesia 01/03/2020   Hyperlipidemia    controlled with meds    Hypertension    NSTEMI (non-ST elevated myocardial infarction) (HCC) 04/25/2014   Postmenopausal    Substance abuse (HCC)     Past Surgical History:  Procedure Laterality Date   A-FLUTTER ABLATION N/A 02/24/2021   Procedure: A-FLUTTER ABLATION;  Surgeon: Lanier Prude, MD;  Location: [facility];  Service: Cardiovascular;  Laterality: N/A;   COLONOSCOPY     HERNIA REPAIR     LEFT HEART CATHETERIZATION WITH CORONARY ANGIOGRAM N/A 04/25/2014   Procedure: LEFT HEART CATHETERIZATION WITH CORONARY ANGIOGRAM;  Surgeon: Pamella Pert, MD;  Location: [facility] C[facility];  Service: Cardiovascular;  Laterality: N/A; with stents placement    POLYPECTOMY     VASECTOMY      Social History   Socioeconomic History   Marital status: Married    Spouse name: Not on file   Number of children: 2   Years of education: Not on file   Highest education level: 12th grade  Occupational History  Not on file  Tobacco Use   Smoking status: Former    Packs/day: 0.50    Years: 10.00    Additional pack years: 0.00    Total pack years: 5.00    Types: Cigarettes    Quit date: 04/12/1982    Years since quitting: 40.2   Smokeless tobacco: Never  Vaping Use   Vaping Use: Never used  Substance and Sexual Activity   Alcohol use: No    Alcohol/week: 0.0 standard drinks of alcohol   Drug use: No   Sexual activity: Yes  Other Topics Concern   Not on file  Social History Narrative   Not on file   Social Determinants of Health   Financial Resource Strain: Low Risk   (07/06/2022)   Overall Financial Resource Strain (CARDIA)    Difficulty of Paying Living Expenses: Not hard at all  Food Insecurity: No Food Insecurity (07/06/2022)   Hunger Vital Sign    Worried About Running Out of Food in the Last Year: Never true    Ran Out of Food in the Last Year: Never true  Transportation Needs: No Transportation Needs (07/06/2022)   PRAPARE - Administrator, Civil Service (Medical): No    Lack of Transportation (Non-Medical): No  Physical Activity: Insufficiently Active (07/06/2022)   Exercise Vital Sign    Days of Exercise per Week: 3 days    Minutes of Exercise per Session: 30 min  Stress: No Stress Concern Present (07/06/2022)   [employer] of Occupational Health - Occupational Stress Questionnaire    Feeling of Stress : Only a little  Social Connections: Moderately Integrated (07/06/2022)   Social Connection and Isolation Panel [NHANES]    Frequency of Communication with Friends and Family: Once a week    Frequency of Social Gatherings with Friends and Family: Once a week    Attends Religious Services: More than 4 times per year    Active Member of [employer] or Organizations: Yes    Attends Engineer, structural: More than 4 times per year    Marital Status: Married  Catering manager Violence: Not on file    Family History  Problem Relation Age of Onset   Cancer Mother        lung   Hypertension Father    Alzheimer's disease Father    Heart disease Sister    Heart disease Brother    Stroke Maternal Grandmother    Alcohol abuse Maternal Grandfather    Cancer Maternal Grandfather    Stroke Paternal Grandmother    Cancer Paternal Grandfather    Colon cancer Neg Hx    Colon polyps Neg Hx    Esophageal cancer Neg Hx    Rectal cancer Neg Hx    Stomach cancer Neg Hx      Review of Systems  Constitutional:  Positive for malaise/fatigue. Negative for chills, fever and weight loss.  HENT: Negative.  Negative for congestion and  sore throat.   Respiratory: Negative.  Negative for cough and shortness of breath.   Cardiovascular:  Negative for chest pain and palpitations.  Gastrointestinal: Negative.  Negative for abdominal pain, diarrhea, nausea and vomiting.  Genitourinary: Negative.  Negative for dysuria.  Musculoskeletal: Negative.  Negative for back pain.  Skin: Negative.  Negative for rash.  Neurological: Negative.  Negative for dizziness and headaches.  All other systems reviewed and are negative.   Vitals:   07/20/22 1429  BP: 138/88  Pulse: 62  Temp: 97.9 F (36.6  C)  SpO2: 98%    Physical Exam Vitals reviewed.  Constitutional:      Appearance: Normal appearance.  HENT:     Head: Normocephalic.     Mouth/Throat:     Mouth: Mucous membranes are moist.     Pharynx: Oropharynx is clear.  Eyes:     Extraocular Movements: Extraocular movements intact.     Conjunctiva/sclera: Conjunctivae normal.     Pupils: Pupils are equal, round, and reactive to light.  Cardiovascular:     Rate and Rhythm: Normal rate and regular rhythm.     Pulses: Normal pulses.     Heart sounds: Normal heart sounds.  Pulmonary:     Effort: Pulmonary effort is normal.     Breath sounds: Normal breath sounds.  Abdominal:     Palpations: Abdomen is soft.     Tenderness: There is no guarding.  Musculoskeletal:     Cervical back: No tenderness.  Lymphadenopathy:     Cervical: No cervical adenopathy.  Skin:    General: Skin is warm and dry.     Capillary Refill: Capillary refill takes less than 2 seconds.     Comments: Right-sided periorbital laceration healing well, still having residual swelling  Neurological:     General: No focal deficit present.     Mental Status: He is alert and oriented to person, place, and time.  Psychiatric:        Mood and Affect: Mood normal.        Behavior: Behavior normal.      ASSESSMENT & PLAN: A total of 42 minutes was spent with the patient and counseling/coordination of care  regarding preparing for this visit, review of most recent office visit notes, review of most recent cardiologist office visit notes, review of multiple chronic medical conditions under management, review of all medications, differential diagnosis of general malaise and need for blood work today, education on nutrition, prognosis, documentation, and need for follow-up.  Problem List Items Addressed This Visit       Cardiovascular and Mediastinum   Essential hypertension    Well-controlled hypertension Continue chlorthalidone 12.5 mg daily and valsartan 320 mg daily Also taking diltiazem 120 mg daily        Other   Malaise and fatigue - Primary    Clinically stable.  No red flag signs or symptoms. Good appetite.  No weight loss. Non-smoker.  Stays physically active and well-hydrated Sleeping well.  No symptoms of sleep apnea No new medications No recent lifestyle changes Does not feel depressed.  Saw therapist last year with good results Will check blood work today including testosterone levels.      Relevant Orders   CBC with Differential/Platelet   Comprehensive metabolic panel   Hemoglobin A1c   Lipid panel   Vitamin B12   VITAMIN D 25 Hydroxy (Vit-D Deficiency, Fractures)   Sedimentation rate   Testosterone   Patient Instructions  Fatigue If you have fatigue, you feel tired all the time and have a lack of energy or a lack of motivation. Fatigue may make it difficult to start or complete tasks because of exhaustion. Occasional or mild fatigue is often a normal response to activity or life. However, long-term (chronic) or extreme fatigue may be a symptom of a medical condition such as: Depression. Not having enough red blood cells or hemoglobin in the blood (anemia). A problem with a small gland located in the lower front part of the neck (thyroid disorder). Rheumatologic conditions. These are problems  related to the body's defense system (immune system). Infections,  especially certain viral infections. Fatigue can also lead to negative health outcomes over time. Follow these instructions at home: Medicines Take over-the-counter and prescription medicines only as told by your health care provider. Take a multivitamin if told by your health care provider. Do not use herbal or dietary supplements unless they are approved by your health care provider. Eating and drinking  Avoid heavy meals in the evening. Eat a well-balanced diet, which includes lean proteins, whole grains, plenty of fruits and vegetables, and low-fat dairy products. Avoid eating or drinking too many products with caffeine in them. Avoid alcohol. Drink enough fluid to keep your urine pale yellow. Activity  Exercise regularly, as told by your health care provider. Use or practice techniques to help you relax, such as yoga, tai chi, meditation, or massage therapy. Lifestyle Change situations that cause you stress. Try to keep your work and personal schedules in balance. Do not use recreational or illegal drugs. General instructions Monitor your fatigue for any changes. Go to bed and get up at the same time every day. Avoid fatigue by pacing yourself during the day and getting enough sleep at night. Maintain a healthy weight. Contact a health care provider if: Your fatigue does not get better. You have a fever. You suddenly lose or gain weight. You have headaches. You have trouble falling asleep or sleeping through the night. You feel angry, guilty, anxious, or sad. You have swelling in your legs or another part of your body. Get help right away if: You feel confused, feel like you might faint, or faint. Your vision is blurry or you have a severe headache. You have severe pain in your abdomen, your back, or the area between your waist and hips (pelvis). You have chest pain, shortness of breath, or an irregular or fast heartbeat. You are unable to urinate, or you urinate less than  normal. You have abnormal bleeding from the rectum, nose, lungs, nipples, or, if you are male, the vagina. You vomit blood. You have thoughts about hurting yourself or others. These symptoms may be an emergency. Get help right away. Call 911. Do not wait to see if the symptoms will go away. Do not drive yourself to the hospital. Get help right away if you feel like you may hurt yourself or others, or have thoughts about taking your own life. Go to your nearest emergency room or: Call 911. Call the National Suicide Prevention Lifeline at 819-776-4199 or 988. This is open 24 hours a day. Text the Crisis Text Line at 325-646-7690. Summary If you have fatigue, you feel tired all the time and have a lack of energy or a lack of motivation. Fatigue may make it difficult to start or complete tasks because of exhaustion. Long-term (chronic) or extreme fatigue may be a symptom of a medical condition. Exercise regularly, as told by your health care provider. Change situations that cause you stress. Try to keep your work and personal schedules in balance. This information is not intended to replace advice given to you by your health care provider. Make sure you discuss any questions you have with your health care provider. Document Revised: 12/23/2020 Document Reviewed: 12/23/2020 Elsevier Patient Education  2023 Elsevier Inc.    Edwina Barth, MD [facility]

## 2022-07-20 NOTE — Assessment & Plan Note (Signed)
Clinically stable.  No red flag signs or symptoms. Good appetite.  No weight loss. Non-smoker.  Stays physically active and well-hydrated Sleeping well.  No symptoms of sleep apnea No new medications No recent lifestyle changes Does not feel depressed.  Saw therapist last year with good results Will check blood work today including testosterone levels.

## 2022-07-20 NOTE — Assessment & Plan Note (Signed)
Well-controlled hypertension Continue chlorthalidone 12.5 mg daily and valsartan 320 mg daily Also taking diltiazem 120 mg daily

## 2022-07-21 LAB — HEMOGLOBIN A1C: Hgb A1c MFr Bld: 6 % (ref 4.6–6.5)

## 2022-10-01 ENCOUNTER — Ambulatory Visit: Payer: Federal, State, Local not specified - PPO | Admitting: Cardiology

## 2022-10-01 ENCOUNTER — Other Ambulatory Visit: Payer: Self-pay | Admitting: Cardiology

## 2022-10-01 DIAGNOSIS — I10 Essential (primary) hypertension: Secondary | ICD-10-CM

## 2022-10-07 ENCOUNTER — Encounter: Payer: Self-pay | Admitting: Cardiology

## 2022-10-07 ENCOUNTER — Ambulatory Visit: Payer: Federal, State, Local not specified - PPO | Admitting: Cardiology

## 2022-10-07 VITALS — BP 103/64 | HR 60 | Resp 16 | Ht 70.0 in | Wt 162.4 lb

## 2022-10-07 DIAGNOSIS — E291 Testicular hypofunction: Secondary | ICD-10-CM

## 2022-10-07 DIAGNOSIS — I25118 Atherosclerotic heart disease of native coronary artery with other forms of angina pectoris: Secondary | ICD-10-CM

## 2022-10-07 NOTE — Progress Notes (Signed)
Primary Physician/Referring:  Georgina Quint, MD  Patient ID: Edwin Mckee., adult    DOB: [birth year], 77 y.o.   MRN: [id number]  Chief Complaint  Patient presents with   Coronary artery disease involving native coronary artery of   Hyperlipidemia   Dyspnea on exertion   HPI:    Edwin Mckee.  is a 77 y.o. Caucasian male with coronary artery disease with history of non-STEMI  04/25/2014 S/P stenting of the proximal and mid LAD and balloon angioplasty of the D1, mild diffuse disease in other vessels, LVEF 50-55%. He also has hypertension, hyperglycemia and mild hyperlipidemia.  Due to typical atrial flutter, underwent atrial flutter ablation on 02/24/2021.  His anticoagulation has been discontinued since March 2023 by EP.  He had presented with fatigue and dyspnea, which could have been anginal equivalent and had evaluated him in April 2024 and he underwent stress testing.  I after his last office visit 3 months ago, I had suspected his hypogonadism to be the etiology for fatigue, he did undergo outpatient blood work by his PCP and testosterone level was mildly reduced but probably appropriate for age.  Patient now presents for follow-up.  Fortunately patient states that all his symptoms of fatigue and dyspnea has resolved and he is asymptomatic now.  Past Medical History:  Diagnosis Date   Arthritis    Depression    Depression    Phreesia 01/03/2020   Hyperlipidemia    controlled with meds    Hypertension    NSTEMI (non-ST elevated myocardial infarction) (HCC) 04/25/2014   Postmenopausal    Substance abuse (HCC)    Past Surgical History:  Procedure Laterality Date   A-FLUTTER ABLATION N/A 02/24/2021   Procedure: A-FLUTTER ABLATION;  Surgeon: Lanier Prude, MD;  Location: [facility] INVASIVE CV LAB;  Service: Cardiovascular;  Laterality: N/A;   COLONOSCOPY     HERNIA REPAIR     LEFT HEART CATHETERIZATION WITH CORONARY ANGIOGRAM N/A 04/25/2014   Procedure: LEFT HEART  CATHETERIZATION WITH CORONARY ANGIOGRAM;  Surgeon: Pamella Pert, MD;  Location: [facility] CATH LAB;  Service: Cardiovascular;  Laterality: N/A; with stents placement    POLYPECTOMY     VASECTOMY     Social History   Tobacco Use   Smoking status: Former    Current packs/day: 0.00    Average packs/day: 0.5 packs/day for 10.0 years (5.0 ttl pk-yrs)    Types: Cigarettes    Start date: 04/12/1972    Quit date: 04/12/1982    Years since quitting: 40.5   Smokeless tobacco: Never  Substance Use Topics   Alcohol use: No    Alcohol/week: 0.0 standard drinks of alcohol  Marital Status: Married   ROS  Review of Systems  Cardiovascular:  Positive for chest pain and dyspnea on exertion. Negative for leg swelling.   Objective      10/07/2022    2:58 PM 07/20/2022    2:29 PM 07/08/2022    2:58 PM  Vitals with BMI  Height 5\' 10"  5\' 10"  5\' 10"   Weight 162 lbs 6 oz 165 lbs 165 lbs  BMI 23.3 23.68 23.68  Systolic 103 138 409  Diastolic 64 88 69  Pulse 60 62 86    Physical Exam Neck:     Vascular: No carotid bruit or JVD.  Cardiovascular:     Rate and Rhythm: Normal rate and regular rhythm.     Pulses: Normal pulses and intact distal pulses.     Heart sounds:  No gallop.  Pulmonary:     Effort: Pulmonary effort is normal.     Breath sounds: Normal breath sounds.  Abdominal:     General: Bowel sounds are normal.     Palpations: Abdomen is soft.  Musculoskeletal:     Right lower leg: No edema.     Left lower leg: No edema.    Laboratory examination:   Lab Results  Component Value Date   NA 140 07/20/2022   K 4.7 07/20/2022   CO2 29 07/20/2022   GLUCOSE 94 07/20/2022   BUN 14 07/20/2022   CREATININE 1.25 07/20/2022   CALCIUM 9.9 07/20/2022   EGFR 65 11/10/2021   GFRNONAA 58 (L) 12/19/2020       Latest Ref Rng & Units 07/20/2022    3:07 PM 04/09/2022   10:30 AM 11/10/2021    1:58 PM  CMP  Glucose 70 - 99 mg/dL 94  962  91   BUN 6 - 23 mg/dL 14  15  16    Creatinine 0.40 -  1.50 mg/dL 9.52  8.41  3.24   Sodium 135 - 145 mEq/L 140  141  140   Potassium 3.5 - 5.1 mEq/L 4.7  4.2  4.5   Chloride 96 - 112 mEq/L 103  105  102   CO2 19 - 32 mEq/L 29  28  26    Calcium 8.4 - 10.5 mg/dL 9.9  9.4  9.7   Total Protein 6.0 - 8.3 g/dL 6.7  6.1    Total Bilirubin 0.2 - 1.2 mg/dL 0.5  0.4    Alkaline Phos 39 - 117 U/L 57  58    AST 0 - 37 U/L 26  19    ALT 0 - 53 U/L 21  15        Latest Ref Rng & Units 07/20/2022    3:07 PM 04/09/2022   10:30 AM 04/02/2021   10:12 AM  CBC  WBC 4.0 - 10.5 K/uL 7.7  6.6  6.3   Hemoglobin 13.0 - 17.0 g/dL 40.1  02.7  25.3   Hematocrit 39.0 - 52.0 % 40.2  39.2  43.3   Platelets 150.0 - 400.0 K/uL 263.0  235.0  208.0    Lipid Panel Recent Labs    04/09/22 1030 07/20/22 1507  CHOL 139 133  TRIG 156.0* 91.0  LDLCALC 65 69  VLDL 31.2 18.2  HDL 42.20 46.00  CHOLHDL 3 3    HEMOGLOBIN A1C Lab Results  Component Value Date   HGBA1C 6.0 07/20/2022   Lab Results  Component Value Date   TSH 3.130 01/04/2020    Testosterone 07/20/2022: 300.00 - 890.00 ng/dL 664.40 Low    Radiology:  No results found.  Other studies  Sleep study 05/27/2020: No Significant Obstructive Sleep Apnea (OSA) - Normal chin atonia - Significant periodic leg movements(PLMs) during sleep. Arousal index associated with leg movements of 10.1 /hour.  - Periodic Limb Movement During Sleep (G47.61) - This study is not supportive of REM Sleep Behavior Disorder. Other causes of dream enactment, including meds, should be considered   RECOMMENDATIONS - PLMs could be treated if clinically necessary.    Cardiac Studies:   Coronary Angiography 04/25/2014:  PTCA and stenting of the proximal and mid LAD with implantation of a 3.0 x 38 mm resolute DES and balloon angioplasty of the D1 with 2.5 x 15 mm balloon.  Mild disease otherwise.  LVEF 50-55%  PCV ECHOCARDIOGRAM COMPLETE 09/18/2020 Left ventricle cavity is normal in size  and wall thickness. Normal global  wall motion. Normal LV systolic function with EF 66%. Doppler evidence of grade I (impaired) diastolic dysfunction, normal LAP. No significant valvular abnormality. Previous study in 2018 had noted normal diastolic function.   Ambulatory cardiac telemetry 08/27/2020 - 09/10/2020: Predominant underlying rhythm was sinus with first-degree AV block and bundle branch block.  Heart rate 43-175 bpm, average heart rate 75 bpm.  7 episodes of supraventricular tachycardia longest lasting 19 beats.  PACs and PVCs were rare.  Patient's symptoms correlated with sinus rhythm and PACs.  No evidence of A. fib, high degree AV block, VT, or pauses >3 seconds.  Atrial flutter ablation 02/24/2021:  1. Isthmus-dependent counter clockwise right atrial flutter.   2. Successful radiofrequency ablation of atrial flutter along the cavotricuspid isthmus with complete bidirectional isthmus block achieved.   3. No inducible arrhythmias following ablation.   4. No early apparent complications.   Exercise nuclear stress test 06/25/2022: There is a small fixed mild defect in the septal and apical regions.  Overall function is abnormal with mild septal hypokinesis. Stress LV EF: 47%.  Abnormal ECG stress. The patient exercised for 8 minutes and 0 seconds of a Bruce protocol, achieving approximately 10.14 METs & 90% MPHR. No chest pain. Stress terminated due to THR achieved. The blood pressure response was normal. Compared to previous study: 10/21/2020 mild perfusion abnormality is new. Previously calculated LVEF is 48% and visually appeared to be normal. Previously patient exercised for 7 minutes and 30 seconds and achieved 7.34 METS Intermediate risk study.   EKG:    EKG 05/27/2022: Normal sinus rhythm heart rate of 62 bpm, normal axis.  Right bundle branch block.  Compared to 10/15/2021, no significant change.  Allergies  No Known Allergies   Medications    Current Outpatient Medications:    acetaminophen (TYLENOL) 500  MG tablet, Take 1,000 mg by mouth every 8 (eight) hours as needed for moderate pain., Disp: , Rfl:    aspirin (ASPIRIN CHILDRENS) 81 MG chewable tablet, Chew 1 tablet (81 mg total) by mouth daily., Disp: , Rfl:    atorvastatin (LIPITOR) 40 MG tablet, Take 1 tablet (40 mg total) by mouth daily., Disp: 90 tablet, Rfl: 3   chlorthalidone (HYGROTON) 25 MG tablet, TAKE 1 TABLET BY MOUTH EVERY DAY IN THE MORNING, Disp: 90 tablet, Rfl: 3   diltiazem (CARDIZEM CD) 120 MG 24 hr capsule, TAKE 1 CAPSULE (120 MG TOTAL) BY MOUTH EVERY EVENING, Disp: 90 capsule, Rfl: 3   valsartan (DIOVAN) 320 MG tablet, Take 1 tablet (320 mg total) by mouth daily., Disp: 90 tablet, Rfl: 3   Assessment     ICD-10-CM   1. Coronary artery disease involving native coronary artery of native heart with other form of angina pectoris (HCC)  I25.118     2. Hypogonadism in male  E29.1      No orders of the defined types were placed in this encounter.  Recommendations:   Harvey Matlack.  is a 77 y.o. Caucasian male with coronary artery disease with history of non-STEMI  04/25/2014 S/P stenting of the proximal and mid LAD and balloon angioplasty of the D1, mild diffuse disease in other vessels, LVEF 50-55%. He also has hypertension, hyperglycemia and mild hyperlipidemia.  Due to typical atrial flutter, underwent typical atrial flutter ablation on 02/24/2021 with no recurrence.  I am seeing him for decreased exercise tolerance and dyspnea and fatigue follow-up.  1. Coronary artery disease involving native coronary artery of native  heart with other form of angina pectoris [facility]) Patient was evaluated by me in March and again in April 2024 for fatigue and decreased exercise tolerance, stress test revealing mild septal and apical defect with mildly reduced LVEF at 47% however when compared to 10/21/2020 stress test, his exercise capacity was higher and no change in EF.  When I initially saw him I had held his statins and in spite of  statin holiday, there was no change in symptoms.  So he is back on all his medications.  Overall I still feel his stress test is low risk.  Hence I had suspected hypogonadism.  Fortunately patient states that all his symptoms of fatigue and dyspnea have resolved and he feels like he is back to his baseline self and has been active without any limitations.  2. Hypogonadism in male His testosterone level is low, I advised him that if he were to get recurrence of any fatigue could try low-dose of testosterone supplement.  For now I do not suspect he needs cardiac catheterization.  I was going to see him back on annual basis however request that I see him back in 6 months.  No changes in the medications were done today.    Yates Decamp, MD, [facility] 10/07/2022, 4:29 PM Office: [phone] Fax: [phone] Pager: [phone]

## 2022-11-10 ENCOUNTER — Ambulatory Visit (INDEPENDENT_AMBULATORY_CARE_PROVIDER_SITE_OTHER): Payer: Federal, State, Local not specified - PPO

## 2022-11-10 ENCOUNTER — Encounter: Payer: Self-pay | Admitting: Internal Medicine

## 2022-11-10 ENCOUNTER — Ambulatory Visit (INDEPENDENT_AMBULATORY_CARE_PROVIDER_SITE_OTHER): Payer: Federal, State, Local not specified - PPO | Admitting: Internal Medicine

## 2022-11-10 VITALS — BP 110/70 | HR 65 | Temp 98.2°F | Ht 70.0 in

## 2022-11-10 DIAGNOSIS — M542 Cervicalgia: Secondary | ICD-10-CM | POA: Diagnosis not present

## 2022-11-10 MED ORDER — PREDNISONE 20 MG PO TABS: 40.0000 mg | ORAL_TABLET | Freq: Every day | ORAL | 0 refills | Status: AC

## 2022-11-10 MED ORDER — TIZANIDINE HCL 2 MG PO CAPS: 2.0000 mg | ORAL_CAPSULE | Freq: Every evening | ORAL | 0 refills | Status: DC | PRN

## 2022-11-10 NOTE — Progress Notes (Signed)
Subjective:    Patient ID: Edwin Asal., adult    DOB: 1945-09-11, 77 y.o.   MRN: [id number]      HPI Edwin Mckee is here for  Chief Complaint  Patient presents with   Neck Pain    Playing golf yesterday; Felt pain in neck. Pain ran down in to his legs.     He was playing golf and swung to hit the ball and felt pain in his neck at impact of the ball and just after he hit it.  The pain was in the left side of his neck and was an electric like pain from the left side of his neck up to his head and down into his left leg.  He did not have any pain down his left arm.  He can not turn his head either way.  Pain in left side of neck and into head up to the base of his skull.  Pain into his left upper back - no pain down arm.  No leg pain.  No numbness or tingling.  He denies any weakness.     Last night tried alternating ice, heat.  Took advil.        Medications and allergies reviewed with patient and updated if appropriate.  Current Outpatient Medications on File Prior to Visit  Medication Sig Dispense Refill   acetaminophen (TYLENOL) 500 MG tablet Take 1,000 mg by mouth every 8 (eight) hours as needed for moderate pain.     aspirin (ASPIRIN CHILDRENS) 81 MG chewable tablet Chew 1 tablet (81 mg total) by mouth daily.     atorvastatin (LIPITOR) 40 MG tablet Take 1 tablet (40 mg total) by mouth daily. 90 tablet 3   chlorthalidone (HYGROTON) 25 MG tablet TAKE 1 TABLET BY MOUTH EVERY DAY IN THE MORNING 90 tablet 3   diltiazem (CARDIZEM CD) 120 MG 24 hr capsule TAKE 1 CAPSULE (120 MG TOTAL) BY MOUTH EVERY EVENING 90 capsule 3   valsartan (DIOVAN) 320 MG tablet Take 1 tablet (320 mg total) by mouth daily. 90 tablet 3   No current facility-administered medications on file prior to visit.    Review of Systems  Eyes:  Negative for visual disturbance.  Musculoskeletal:  Positive for neck pain and neck stiffness.  Neurological:  Negative for dizziness, weakness, numbness and  headaches.       Objective:   Vitals:   11/10/22 1415  BP: 110/70  Pulse: 65  Temp: 98.2 F (36.8 C)  SpO2: 97%   BP Readings from Last 3 Encounters:  11/10/22 110/70  10/07/22 103/64  07/20/22 138/88   Wt Readings from Last 3 Encounters:  10/07/22 162 lb 6.4 oz (73.7 kg)  07/20/22 165 lb (74.8 kg)  07/08/22 165 lb (74.8 kg)   Body mass index is 23.3 kg/m.    Physical Exam Constitutional:      General: He is not in acute distress.    Appearance: Normal appearance. He is not ill-appearing.  HENT:     Head: Normocephalic and atraumatic.  Musculoskeletal:        General: Tenderness (Posterior neck and left side of neck.  No right posterior neck pain.  No upper back muscle tenderness) present. No swelling or deformity.     Comments: Decreased range of motion of his neck secondary to pain  Skin:    General: Skin is warm and dry.     Findings: No erythema or rash.  Neurological:     General:  No focal deficit present.     Mental Status: He is alert and oriented to person, place, and time.     Cranial Nerves: No cranial nerve deficit.     Sensory: No sensory deficit.     Motor: No weakness.            Assessment & Plan:    Pain left side of neck: Acute Started yesterday when he was playing golf and hit the ball.  Had instant pain in his neck with electric-like pain radiating up his head and down his left leg Currently has a very stiff neck and pain with any movement-cannot move right or left No radiculopathy-numbness, tingling or weakness in arms or legs No headache, dizziness Likely muscle strain/spasm No neurological deficit on exam X-ray cervical spine Start prednisone 40 mg daily x 4 days Tizanidine 2-4 mg nightly as needed Call if no improvement or if symptoms worsen or new symptoms develop

## 2022-11-10 NOTE — Patient Instructions (Addendum)
      Have an xray today downstairs    Medications changes include :   prednisone 40 mg daily x 5 days, tizanidine (muscle relaxer at night)     Return if symptoms worsen or fail to improve.

## 2022-11-11 IMAGING — MR MR HEAD W/O CM
12 of 13 series · 44 of 48 positions shown · non-contrast
Comparison: None.

CLINICAL DATA: Initial evaluation for acute TIA, visual loss./

EXAM:
MRI HEAD WITHOUT CONTRAST
TECHNIQUE: Multiplanar, multiecho pulse sequences of the brain and surrounding
structures were obtained without intravenous contrast.

[Series 5: DWI · axial · 3.0mm · 0.88mm/px · z∈[-71,+79]mm · 8 of 104 slices shown (1 of 4)]
[im 1/104]
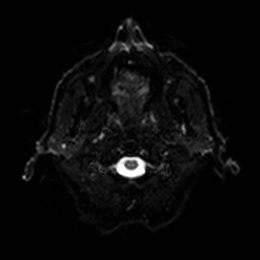
[im 15/104]
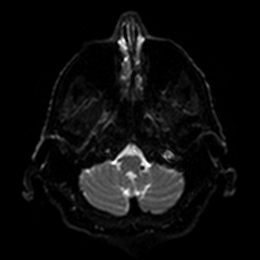
[im 30/104]
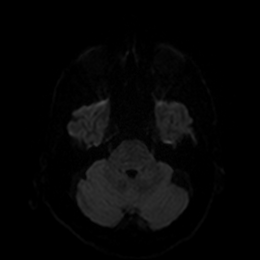
[im 45/104]
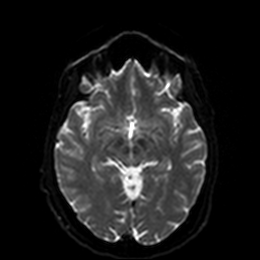
[im 59/104]
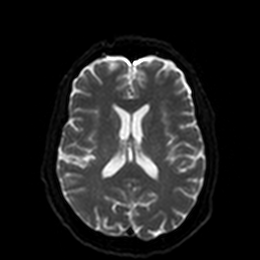
[im 74/104]
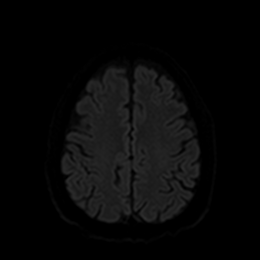
[im 89/104]
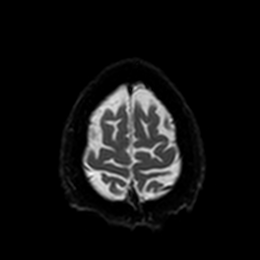
[im 104/104]
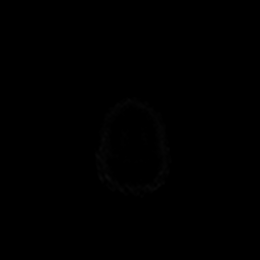

[Series 6: DWI · axial · 3.0mm · 0.88mm/px · z∈[-71,+79]mm · 4 of 52 slices shown (2 of 4)]
[im 1/52]
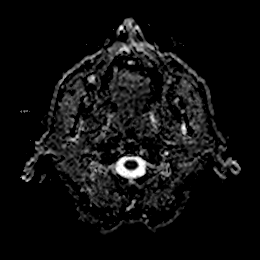
[im 18/52]
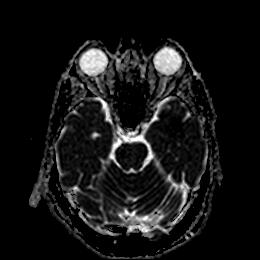
[im 35/52]
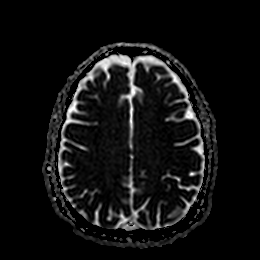
[im 52/52]
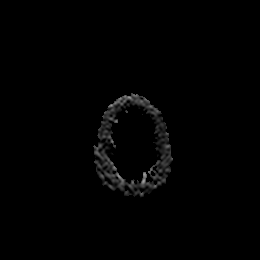

[Series 7: DWI · coronal · 4.0mm · 0.88mm/px · 5 of 72 slices shown (3 of 4)]
[im 1/72]
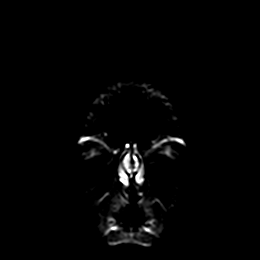
[im 18/72]
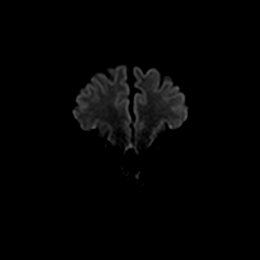
[im 36/72]
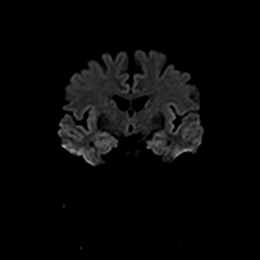
[im 54/72]
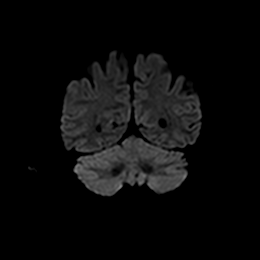
[im 72/72]
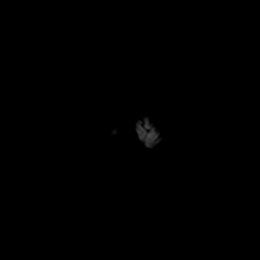

[Series 8: DWI · coronal · 4.0mm · 0.88mm/px · 3 of 36 slices shown (4 of 4)]
[im 1/36]
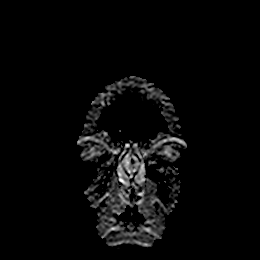
[im 18/36]
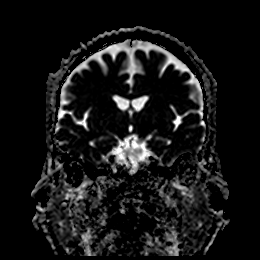
[im 36/36]
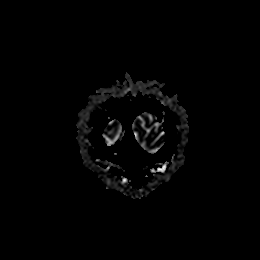

[Series 9: T1 · sagittal · 5.0mm · 0.75mm/px · 2 of 25 slices shown]
[im 1/25]
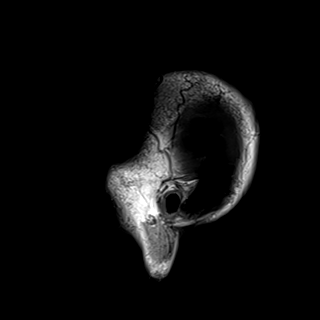
[im 25/25]
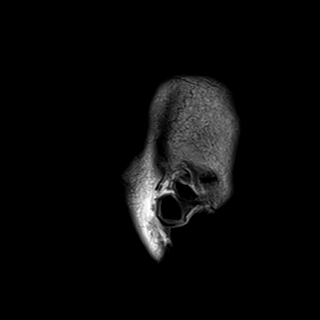

[Series 10: T2 · axial · 5.0mm · 0.72mm/px · z∈[-73,+80]mm · 2 of 27 slices shown (1 of 2)]
[im 1/27]
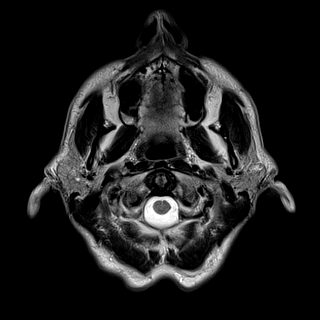
[im 27/27]
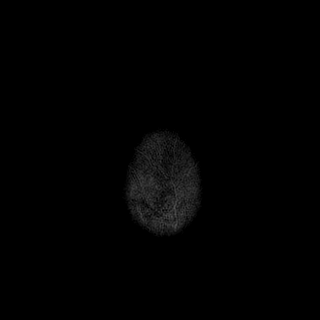

[Series 11: FLAIR · axial · 5.0mm · 0.45mm/px · z∈[-73,+80]mm · 2 of 27 slices shown]
[im 1/27]
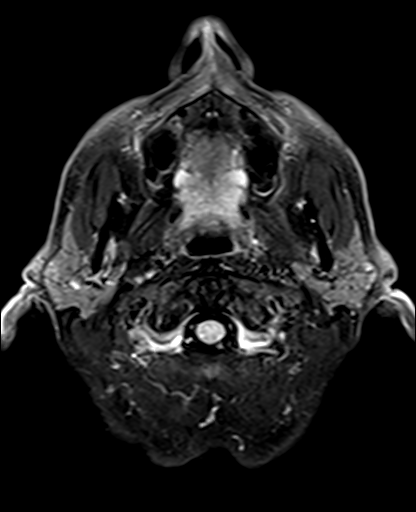
[im 27/27]
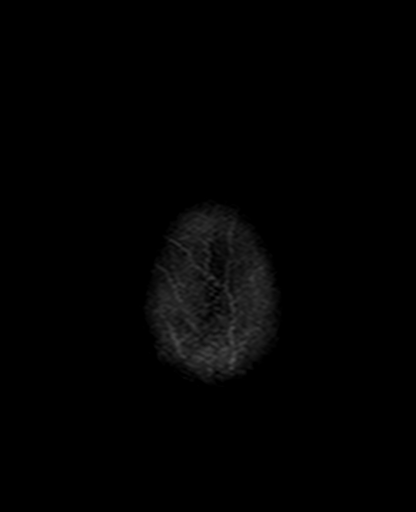

[Series 12: mag_images · axial · 3.0mm · 0.90mm/px · z∈[-77,+85]mm · 4 of 56 slices shown]
[im 1/56]
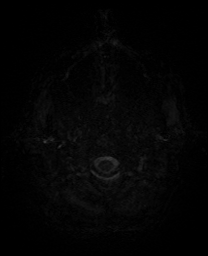
[im 19/56]
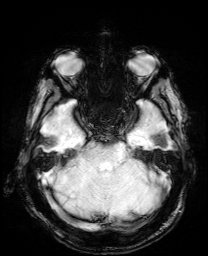
[im 37/56]
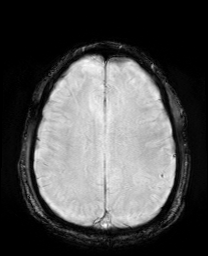
[im 56/56]
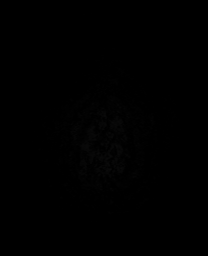

[Series 13: pha_images · axial · 3.0mm · 0.90mm/px · z∈[-77,+79]mm · 4 of 54 slices shown]
[im 1/54]
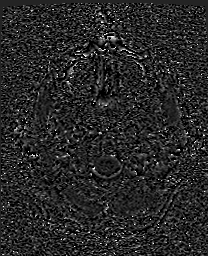
[im 18/54]
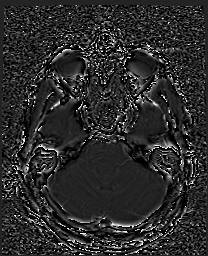
[im 36/54]
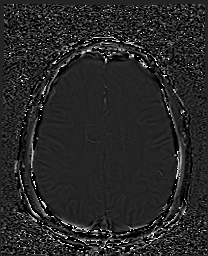
[im 54/54]
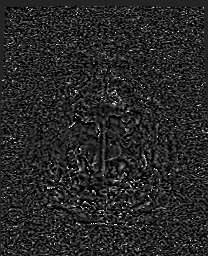

[Series 14: swi_images · axial · 3.0mm · 0.90mm/px · z∈[-77,+85]mm · 4 of 56 slices shown]
[im 1/56]
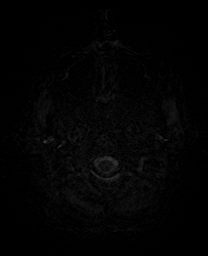
[im 19/56]
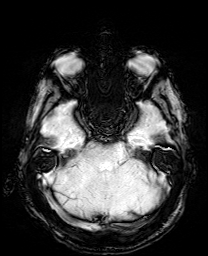
[im 37/56]
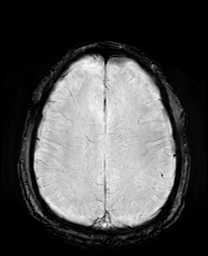
[im 56/56]
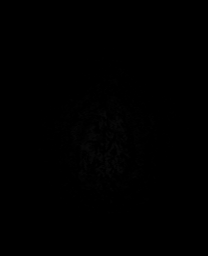

[Series 15: mip_images(sw) · axial · 24.0mm · 0.90mm/px · z∈[-67,+74]mm · 4 of 49 slices shown]
[im 1/49]
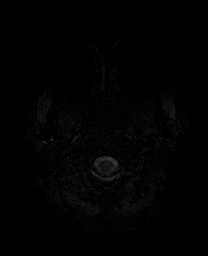
[im 17/49]
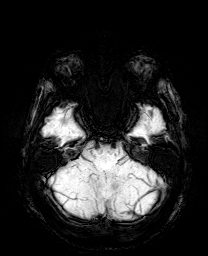
[im 33/49]
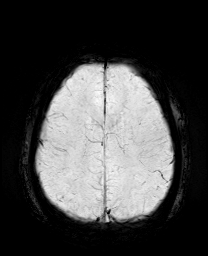
[im 49/49]
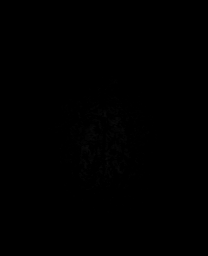

[Series 17: T2 · coronal · 5.0mm · 0.34mm/px · 2 of 29 slices shown (2 of 2)]
[im 1/29]
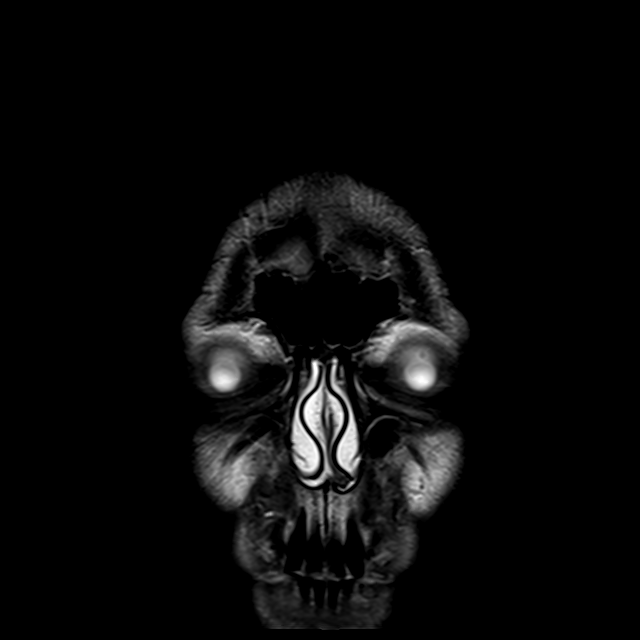
[im 29/29]
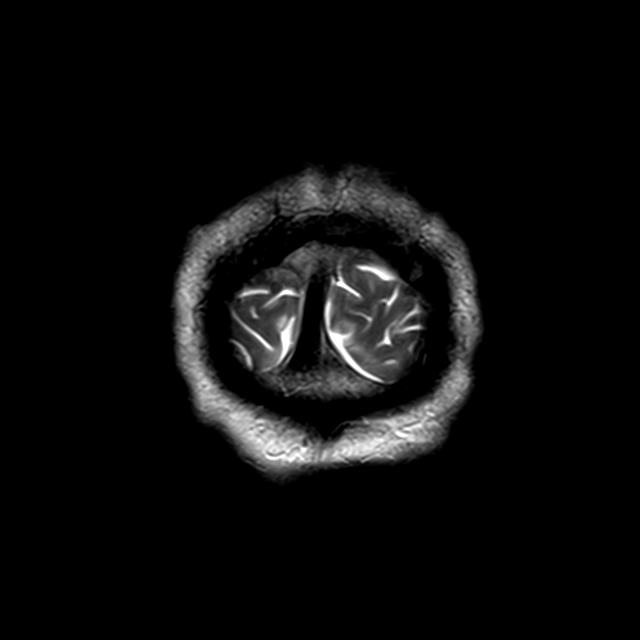

[44 of 48 positions shown; findings below may reference images not displayed]

FINDINGS: Brain: Cerebral volume within normal limits for patient age. No
focal parenchymal signal abnormality identified.

No abnormal foci of restricted diffusion to suggest acute or
subacute ischemia. Gray-white matter differentiation well
maintained. No encephalomalacia to suggest chronic infarction. No
foci of susceptibility artifact to suggest acute or chronic
intracranial hemorrhage.

No mass lesion, midline shift or mass effect. No hydrocephalus. No
extra-axial fluid collection.

Pituitary gland and suprasellar region are normal. Midline
structures intact and normal.

Vascular: Major intracranial vascular flow voids well maintained and
normal in appearance.

Skull and upper cervical spine: Craniocervical junction normal.
Visualized upper cervical spine within normal limits. Bone marrow
signal intensity normal. No scalp soft tissue abnormality.

Sinuses/Orbits: Globes and orbital soft tissues within normal
limits.

Mild mucosal thickening noted within the ethmoidal air cells.
Paranasal sinuses are otherwise clear. No mastoid effusion.

Other: None.
IMPRESSION: Normal brain MRI.  No acute intracranial abnormality identified.

## 2022-11-19 ENCOUNTER — Telehealth: Payer: Self-pay | Admitting: Emergency Medicine

## 2022-11-19 ENCOUNTER — Ambulatory Visit: Payer: Federal, State, Local not specified - PPO | Admitting: Emergency Medicine

## 2022-11-19 ENCOUNTER — Encounter: Payer: Self-pay | Admitting: Emergency Medicine

## 2022-11-19 VITALS — BP 116/78 | HR 95 | Temp 98.2°F | Wt 161.2 lb

## 2022-11-19 DIAGNOSIS — R5381 Other malaise: Secondary | ICD-10-CM | POA: Diagnosis not present

## 2022-11-19 DIAGNOSIS — Z8659 Personal history of other mental and behavioral disorders: Secondary | ICD-10-CM | POA: Diagnosis not present

## 2022-11-19 DIAGNOSIS — E291 Testicular hypofunction: Secondary | ICD-10-CM | POA: Diagnosis not present

## 2022-11-19 DIAGNOSIS — R5383 Other fatigue: Secondary | ICD-10-CM

## 2022-11-19 DIAGNOSIS — I2510 Atherosclerotic heart disease of native coronary artery without angina pectoris: Secondary | ICD-10-CM

## 2022-11-19 DIAGNOSIS — I10 Essential (primary) hypertension: Secondary | ICD-10-CM | POA: Diagnosis not present

## 2022-11-19 DIAGNOSIS — Z955 Presence of coronary angioplasty implant and graft: Secondary | ICD-10-CM

## 2022-11-19 MED ORDER — XYOSTED 75 MG/0.5ML ~~LOC~~ SOAJ
75.0000 mg | SUBCUTANEOUS | 5 refills | Status: DC
Start: 2022-11-19 — End: 2022-11-23

## 2022-11-19 NOTE — Telephone Encounter (Signed)
Patient called and has questions xyosted that was prescribed.  Please call patient back

## 2022-11-19 NOTE — Assessment & Plan Note (Signed)
Contributing to fatigue and malaise Recommend trial of testosterone Recommend Xyosted 75 mg weekly

## 2022-11-19 NOTE — Progress Notes (Signed)
Edwin Mckee. 77 y.o.   Chief Complaint  Patient presents with   Follow-up    Fatigue, and weakness with minimal exertion    HISTORY OF PRESENT ILLNESS: This is a 77 y.o. adult complaining of easy fatigability, general weakness, lack of stamina with minimal exertion for the past several months Seen by me last May for the same.  Extensive blood work showed low testosterone levels Has been able to follow-up with his cardiologist. 10/07/2022 cardiologist office visit assessment and plan as follows: Assessment        ICD-10-CM    1. Coronary artery disease involving native coronary artery of native heart with other form of angina pectoris (HCC)  I25.118       2. Hypogonadism in male  E29.1         No orders of the defined types were placed in this encounter.   Recommendations:    Edwin Mckee.  is a 77 y.o. Caucasian male with coronary artery disease with history of non-STEMI  04/25/2014 S/P stenting of the proximal and mid LAD and balloon angioplasty of the D1, mild diffuse disease in other vessels, LVEF 50-55%. He also has hypertension, hyperglycemia and mild hyperlipidemia.  Due to typical atrial flutter, underwent typical atrial flutter ablation on 02/24/2021 with no recurrence.  I am seeing him for decreased exercise tolerance and dyspnea and fatigue follow-up.   1. Coronary artery disease involving native coronary artery of native heart with other form of angina pectoris [facility]) Patient was evaluated by me in March and again in April 2024 for fatigue and decreased exercise tolerance, stress test revealing mild septal and apical defect with mildly reduced LVEF at 47% however when compared to 10/21/2020 stress test, his exercise capacity was higher and no change in EF.   When I initially saw him I had held his statins and in spite of statin holiday, there was no change in symptoms.  So he is back on all his medications.   Overall I still feel his stress test is low risk.  Hence I  had suspected hypogonadism.  Fortunately patient states that all his symptoms of fatigue and dyspnea have resolved and he feels like he is back to his baseline self and has been active without any limitations.   2. Hypogonadism in male His testosterone level is low, I advised him that if he were to get recurrence of any fatigue could try low-dose of testosterone supplement.  For now I do not suspect he needs cardiac catheterization.  I was going to see him back on annual basis however request that I see him back in 6 months.  No changes in the medications were done today.      Edwin Decamp, MD, [facility] 10/07/2022, 4:29 PM Office: [phone] Fax: [phone] Pager: [phone]   HPI   Prior to Admission medications   Medication Sig Start Date End Date Taking? Authorizing Provider  acetaminophen (TYLENOL) 500 MG tablet Take 1,000 mg by mouth every 8 (eight) hours as needed for moderate pain.   Yes [provider]  aspirin (ASPIRIN CHILDRENS) 81 MG chewable tablet Chew 1 tablet (81 mg total) by mouth daily. 10/15/21  Yes Edwin Decamp, MD  atorvastatin (LIPITOR) 40 MG tablet Take 1 tablet (40 mg total) by mouth daily. 07/08/22  Yes Edwin Decamp, MD  chlorthalidone (HYGROTON) 25 MG tablet TAKE 1 TABLET BY MOUTH EVERY DAY IN THE MORNING 10/01/22  Yes Edwin Decamp, MD  diltiazem (CARDIZEM CD) 120 MG 24  hr capsule TAKE 1 CAPSULE (120 MG TOTAL) BY MOUTH EVERY EVENING 10/01/22 09/26/23 Yes Edwin Decamp, MD  Testosterone Enanthate (XYOSTED) 75 MG/0.5ML SOAJ Inject 75 mg into the skin once a week. 11/19/22  Yes Edwin Mckee, Edwin Kempf, MD  tizanidine (ZANAFLEX) 2 MG capsule Take 1-2 capsules (2-4 mg total) by mouth at bedtime as needed for muscle spasms. 11/10/22  Yes Burns, Edwin Mo, MD  valsartan (DIOVAN) 320 MG tablet Take 1 tablet (320 mg total) by mouth daily. 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   Atrial flutter with rapid ventricular response (HCC)    Long term (current) use of anticoagulants    Benign hypertension    Mixed hyperlipidemia    Atherosclerosis of native coronary artery of native heart without angina pectoris    History of coronary artery stent placement    IHD (ischemic heart disease) 12/16/2016   History of MI (myocardial infarction) 12/16/2016   History of depression 12/16/2016   Hearing loss 05/04/2012   HYPERLIPIDEMIA 01/26/2008   Essential hypertension 01/26/2008   INGUINAL HERNIA, HX OF 01/26/2008    Past Medical History:  Diagnosis Date   Arthritis    Depression    Depression    Phreesia 01/03/2020   Hyperlipidemia    controlled with meds    Hypertension    NSTEMI (non-ST elevated myocardial infarction) (HCC) 04/25/2014   Postmenopausal    Substance abuse (HCC)     Past Surgical History:  Procedure Laterality Date   A-FLUTTER ABLATION N/A 02/24/2021   Procedure: A-FLUTTER ABLATION;  Surgeon: Edwin Prude, MD;  Location: [facility] INVASIVE CV LAB;  Service: Cardiovascular;  Laterality: N/A;   COLONOSCOPY     HERNIA REPAIR     LEFT HEART CATHETERIZATION WITH CORONARY ANGIOGRAM N/A 04/25/2014   Procedure: LEFT HEART CATHETERIZATION WITH CORONARY ANGIOGRAM;  Surgeon: Edwin Pert, MD;  [facility] C[facility];  Service: Cardiovascular;  Laterality: N/A; with stents placement    POLYPECTOMY     VASECTOMY      Social History   Socioeconomic History   Marital status: Married    Spouse name: Not on file   Number of children: 2   Years of education: Not on file   Highest education level: 12th grade  Occupational History   Not on file  Tobacco Use   Smoking status: Former    Current packs/day: 0.00    Average packs/day: 0.5 packs/day for 10.0 years (5.0 ttl pk-yrs)    Types: Cigarettes    Start  date: 04/12/1972    Quit date: 04/12/1982    Years since quitting: 40.6   Smokeless tobacco: Never  Vaping Use   Vaping status: Never Used  Substance and Sexual Activity   Alcohol use: No    Alcohol/week: 0.0 standard drinks of alcohol   Drug use: No   Sexual activity: Yes  Other Topics Concern   Not on file  Social History Narrative   Not on file   Social Determinants of Health   Financial Resource Strain: Low Risk  (07/06/2022)   Overall Financial Resource Strain (CARDIA)    Difficulty of Paying Living Expenses: Not hard at all  Food Insecurity: No Food Insecurity (07/06/2022)   Hunger Vital  Sign    Worried About Programme researcher, broadcasting/film/video in the Last Year: Never true    Ran Out of Food in the Last Year: Never true  Transportation Needs: No Transportation Needs (07/06/2022)   PRAPARE - Administrator, Civil Service (Medical): No    Lack of Transportation (Non-Medical): No  Physical Activity: Insufficiently Active (07/06/2022)   Exercise Vital Sign    Days of Exercise per Week: 3 days    Minutes of Exercise per Session: 30 min  Stress: No Stress Concern Present (07/06/2022)   [employer] of Occupational Health - Occupational Stress Questionnaire    Feeling of Stress : Only a little  Social Connections: Moderately Integrated (07/06/2022)   Social Connection and Isolation Panel [NHANES]    Frequency of Communication with Friends and Family: Once a week    Frequency of Social Gatherings with Friends and Family: Once a week    Attends Religious Services: More than 4 times per year    Active Member of [employer] or Organizations: Yes    Attends Engineer, structural: More than 4 times per year    Marital Status: Married  Catering manager Violence: Not on file    Family History  Problem Relation Age of Onset   Cancer Mother        lung   Hypertension Father    Alzheimer's disease Father    Heart disease Sister    Heart disease Brother    Stroke Maternal  Grandmother    Alcohol abuse Maternal Grandfather    Cancer Maternal Grandfather    Stroke Paternal Grandmother    Cancer Paternal Grandfather    Colon cancer Neg Hx    Colon polyps Neg Hx    Esophageal cancer Neg Hx    Rectal cancer Neg Hx    Stomach cancer Neg Hx      Review of Systems  Constitutional:  Positive for malaise/fatigue. Negative for chills and fever.  HENT: Negative.  Negative for congestion and sore throat.   Respiratory: Negative.  Negative for cough and shortness of breath.   Cardiovascular: Negative.  Negative for chest pain and palpitations.  Gastrointestinal:  Negative for abdominal pain, diarrhea, nausea and vomiting.  Genitourinary: Negative.  Negative for dysuria and hematuria.  Skin: Negative.  Negative for rash.  Neurological:  Positive for weakness.  All other systems reviewed and are negative.   Vitals:   11/19/22 1323  BP: 116/78  Pulse: 95  Temp: 98.2 F (36.8 C)  SpO2: 98%    Physical Exam Vitals reviewed.  Constitutional:      Appearance: Normal appearance.  HENT:     Head: Normocephalic.  Eyes:     Extraocular Movements: Extraocular movements intact.  Cardiovascular:     Rate and Rhythm: Normal rate.  Pulmonary:     Effort: Pulmonary effort is normal.  Skin:    General: Skin is warm and dry.     Capillary Refill: Capillary refill takes less than 2 seconds.  Neurological:     Mental Status: He is alert and oriented to person, place, and time.  Psychiatric:        Mood and Affect: Mood normal.        Behavior: Behavior normal.      ASSESSMENT & PLAN: A total of 45 minutes was spent with the patient and counseling/coordination of care regarding preparing for this visit, review of most recent office visit notes, review of most recent cardiologist office visit notes, review  of chronic medical conditions under management, review of all medications, review of most recent blood work results, differential diagnosis of fatigue and  general malaise, prognosis, documentation and need for follow-up.  Problem List Items Addressed This Visit       Cardiovascular and Mediastinum   Essential hypertension    BP Readings from Last 3 Encounters:  11/19/22 116/78  11/10/22 110/70  10/07/22 103/64  Well-controlled hypertension Doubt medications are causing fatigue and malaise Continue chlorthalidone 25 mg daily, Cardizem 120 mg daily and valsartan 320 milligrams daily       Atherosclerosis of native coronary artery of native heart without angina pectoris    Stable.  Most recent cardiology assessment reviewed. Considering catheter.  History of stent placement. No anginal episodes.  Stable.  No concerns.        Endocrine   Hypogonadism in male    Contributing to fatigue and malaise Recommend trial of testosterone Recommend Xyosted 75 mg weekly      Relevant Medications   Testosterone Enanthate (XYOSTED) 75 MG/0.5ML SOAJ     Other   History of depression    Well-controlled.  May be a contributing factor. Used to be on Prozac but not anymore. Feels it is under control.      History of coronary artery stent placement    Continues atorvastatin 40 mg daily and daily baby aspirin      Malaise and fatigue - Primary    Currently active and affecting quality of life Fatigue with relatively small amounts of exertion Extensive blood work done last May within normal limits except for low testosterone Recommend trial of weekly testosterone Normal blood pressure.  Normal PSA.  No history of prostate cancer. Normal CBC.  No history of erythrocytosis.  Non-smoker.  No history of peripheral blood clots.      Patient Instructions  Fatigue If you have fatigue, you feel tired all the time and have a lack of energy or a lack of motivation. Fatigue may make it difficult to start or complete tasks because of exhaustion. Occasional or mild fatigue is often a normal response to activity or life. However, long-term (chronic)  or extreme fatigue may be a symptom of a medical condition such as: Depression. Not having enough red blood cells or hemoglobin in the blood (anemia). A problem with a small gland located in the lower front part of the neck (thyroid disorder). Rheumatologic conditions. These are problems related to the body's defense system (immune system). Infections, especially certain viral infections. Fatigue can also lead to negative health outcomes over time. Follow these instructions at home: Medicines Take over-the-counter and prescription medicines only as told by your health care provider. Take a multivitamin if told by your health care provider. Do not use herbal or dietary supplements unless they are approved by your health care provider. Eating and drinking  Avoid heavy meals in the evening. Eat a well-balanced diet, which includes lean proteins, whole grains, plenty of fruits and vegetables, and low-fat dairy products. Avoid eating or drinking too many products with caffeine in them. Avoid alcohol. Drink enough fluid to keep your urine pale yellow. Activity  Exercise regularly, as told by your health care provider. Use or practice techniques to help you relax, such as yoga, tai chi, meditation, or massage therapy. Lifestyle Change situations that cause you stress. Try to keep your work and personal schedules in balance. Do not use recreational or illegal drugs. General instructions Monitor your fatigue for any changes. Go to bed and  get up at the same time every day. Avoid fatigue by pacing yourself during the day and getting enough sleep at night. Maintain a healthy weight. Contact a health care provider if: Your fatigue does not get better. You have a fever. You suddenly lose or gain weight. You have headaches. You have trouble falling asleep or sleeping through the night. You feel angry, guilty, anxious, or sad. You have swelling in your legs or another part of your body. Get  help right away if: You feel confused, feel like you might faint, or faint. Your vision is blurry or you have a severe headache. You have severe pain in your abdomen, your back, or the area between your waist and hips (pelvis). You have chest pain, shortness of breath, or an irregular or fast heartbeat. You are unable to urinate, or you urinate less than normal. You have abnormal bleeding from the rectum, nose, lungs, nipples, or, if you are male, the vagina. You vomit blood. You have thoughts about hurting yourself or others. These symptoms may be an emergency. Get help right away. Call 911. Do not wait to see if the symptoms will go away. Do not drive yourself to the hospital. Get help right away if you feel like you may hurt yourself or others, or have thoughts about taking your own life. Go to your nearest emergency room or: Call 911. Call the National Suicide Prevention Lifeline at 601-482-4615 or 988. This is open 24 hours a day. Text the Crisis Text Line at (806)415-3588. Summary If you have fatigue, you feel tired all the time and have a lack of energy or a lack of motivation. Fatigue may make it difficult to start or complete tasks because of exhaustion. Long-term (chronic) or extreme fatigue may be a symptom of a medical condition. Exercise regularly, as told by your health care provider. Change situations that cause you stress. Try to keep your work and personal schedules in balance. This information is not intended to replace advice given to you by your health care provider. Make sure you discuss any questions you have with your health care provider. Document Revised: 12/23/2020 Document Reviewed: 12/23/2020 Elsevier Patient Education  2024 Elsevier Inc.      Edwina Barth, MD [facility]

## 2022-11-19 NOTE — Assessment & Plan Note (Signed)
Well-controlled.  May be a contributing factor. Used to be on Prozac but not anymore. Feels it is under control.

## 2022-11-19 NOTE — Assessment & Plan Note (Signed)
BP Readings from Last 3 Encounters:  11/19/22 116/78  11/10/22 110/70  10/07/22 103/64  Well-controlled hypertension Doubt medications are causing fatigue and malaise Continue chlorthalidone 25 mg daily, Cardizem 120 mg daily and valsartan 320 milligrams daily

## 2022-11-19 NOTE — Assessment & Plan Note (Addendum)
Currently active and affecting quality of life Fatigue with relatively small amounts of exertion Extensive blood work done last May within normal limits except for low testosterone Recommend trial of weekly testosterone Normal blood pressure.  Normal PSA.  No history of prostate cancer. Normal CBC.  No history of erythrocytosis.  Non-smoker.  No history of peripheral blood clots.

## 2022-11-19 NOTE — Patient Instructions (Signed)
Fatigue If you have fatigue, you feel tired all the time and have a lack of energy or a lack of motivation. Fatigue may make it difficult to start or complete tasks because of exhaustion. Occasional or mild fatigue is often a normal response to activity or life. However, long-term (chronic) or extreme fatigue may be a symptom of a medical condition such as: Depression. Not having enough red blood cells or hemoglobin in the blood (anemia). A problem with a small gland located in the lower front part of the neck (thyroid disorder). Rheumatologic conditions. These are problems related to the body's defense system (immune system). Infections, especially certain viral infections. Fatigue can also lead to negative health outcomes over time. Follow these instructions at home: Medicines Take over-the-counter and prescription medicines only as told by your health care provider. Take a multivitamin if told by your health care provider. Do not use herbal or dietary supplements unless they are approved by your health care provider. Eating and drinking  Avoid heavy meals in the evening. Eat a well-balanced diet, which includes lean proteins, whole grains, plenty of fruits and vegetables, and low-fat dairy products. Avoid eating or drinking too many products with caffeine in them. Avoid alcohol. Drink enough fluid to keep your urine pale yellow. Activity  Exercise regularly, as told by your health care provider. Use or practice techniques to help you relax, such as yoga, tai chi, meditation, or massage therapy. Lifestyle Change situations that cause you stress. Try to keep your work and personal schedules in balance. Do not use recreational or illegal drugs. General instructions Monitor your fatigue for any changes. Go to bed and get up at the same time every day. Avoid fatigue by pacing yourself during the day and getting enough sleep at night. Maintain a healthy weight. Contact a health care  provider if: Your fatigue does not get better. You have a fever. You suddenly lose or gain weight. You have headaches. You have trouble falling asleep or sleeping through the night. You feel angry, guilty, anxious, or sad. You have swelling in your legs or another part of your body. Get help right away if: You feel confused, feel like you might faint, or faint. Your vision is blurry or you have a severe headache. You have severe pain in your abdomen, your back, or the area between your waist and hips (pelvis). You have chest pain, shortness of breath, or an irregular or fast heartbeat. You are unable to urinate, or you urinate less than normal. You have abnormal bleeding from the rectum, nose, lungs, nipples, or, if you are male, the vagina. You vomit blood. You have thoughts about hurting yourself or others. These symptoms may be an emergency. Get help right away. Call 911. Do not wait to see if the symptoms will go away. Do not drive yourself to the hospital. Get help right away if you feel like you may hurt yourself or others, or have thoughts about taking your own life. Go to your nearest emergency room or: Call 911. Call the National Suicide Prevention Lifeline at 1-800-273-8255 or 988. This is open 24 hours a day. Text the Crisis Text Line at 741741. Summary If you have fatigue, you feel tired all the time and have a lack of energy or a lack of motivation. Fatigue may make it difficult to start or complete tasks because of exhaustion. Long-term (chronic) or extreme fatigue may be a symptom of a medical condition. Exercise regularly, as told by your health care provider.   Change situations that cause you stress. Try to keep your work and personal schedules in balance. This information is not intended to replace advice given to you by your health care provider. Make sure you discuss any questions you have with your health care provider. Document Revised: 12/23/2020 Document  Reviewed: 12/23/2020 Elsevier Patient Education  2024 [employer] Inc.  

## 2022-11-19 NOTE — Assessment & Plan Note (Signed)
Stable.  Most recent cardiology assessment reviewed. Considering catheter.  History of stent placement. No anginal episodes.  Stable.  No concerns.

## 2022-11-19 NOTE — Assessment & Plan Note (Signed)
Continues atorvastatin 40 mg daily and daily baby aspirin

## 2022-11-20 NOTE — Telephone Encounter (Signed)
I agree with the alternative.  Sounds great.  Where should we send a new prescription?  Dose should then be 50 mg subcutaneously weekly.  Thanks.

## 2022-11-23 ENCOUNTER — Other Ambulatory Visit: Payer: Self-pay | Admitting: Emergency Medicine

## 2022-11-23 DIAGNOSIS — E291 Testicular hypofunction: Secondary | ICD-10-CM

## 2022-11-23 MED ORDER — XYOSTED 50 MG/0.5ML ~~LOC~~ SOAJ
50.0000 mg | SUBCUTANEOUS | 5 refills | Status: DC
Start: 2022-11-23 — End: 2022-11-23

## 2022-11-23 MED ORDER — TESTOSTERONE CYPIONATE 200 MG/ML IM KIT
50.0000 mg | PACK | INTRAMUSCULAR | 3 refills | Status: DC
Start: 2022-11-23 — End: 2023-04-07

## 2022-11-23 NOTE — Telephone Encounter (Signed)
New prescription sent to pharmacy requested.  However, could not find the multidose vial as an option.  Thanks.

## 2022-11-23 NOTE — Telephone Encounter (Signed)
Called patient pharmacy. The pharmacist states the Testosterone Enanthate is the equivalent to the [place]. She states it's up to the provider if he wants to order the Testosterone Cypionate.

## 2022-11-23 NOTE — Telephone Encounter (Signed)
New prescription for testosterone cypionate 50 mg every 2 weeks sent to pharmacy of record today.  Thanks.

## 2022-11-24 ENCOUNTER — Telehealth: Payer: Self-pay | Admitting: Emergency Medicine

## 2022-11-24 NOTE — Telephone Encounter (Signed)
Called patient and left message for patient to call office if he has any questions pertaining to his medication

## 2022-11-24 NOTE — Telephone Encounter (Signed)
[facility] called for medication clarification. They were sent a prescription for Testosterone Cypionate 200 MG/ML KIT. However, a local [facility] was [facility] Testosterone Enanthate (XYOSTED) 50 MG/0.5ML SOAJ yesterday. They would like to verify what they should do and if patient is supposed to be on both medications. Reference number is [id number]. Best callback is [phone], opt 2.

## 2022-11-25 NOTE — Telephone Encounter (Signed)
Called [facility] to verify that the correct medication is Testosterone Cypionate 200mg /ml not the [facility].  They are going to fill the prescription and send it out to the patient

## 2022-12-08 ENCOUNTER — Other Ambulatory Visit (HOSPITAL_COMMUNITY): Payer: Self-pay

## 2023-01-14 ENCOUNTER — Other Ambulatory Visit: Payer: Self-pay

## 2023-01-14 DIAGNOSIS — E7800 Pure hypercholesterolemia, unspecified: Secondary | ICD-10-CM

## 2023-01-14 DIAGNOSIS — I25118 Atherosclerotic heart disease of native coronary artery with other forms of angina pectoris: Secondary | ICD-10-CM

## 2023-01-14 MED ORDER — ATORVASTATIN CALCIUM 40 MG PO TABS
40.0000 mg | ORAL_TABLET | Freq: Every day | ORAL | 2 refills | Status: DC
Start: 2023-01-14 — End: 2023-06-16

## 2023-02-16 DIAGNOSIS — M545 Low back pain, unspecified: Secondary | ICD-10-CM | POA: Insufficient documentation

## 2023-04-07 ENCOUNTER — Encounter: Payer: Self-pay | Admitting: Cardiology

## 2023-04-07 ENCOUNTER — Ambulatory Visit: Payer: Federal, State, Local not specified - PPO | Attending: Cardiology | Admitting: Cardiology

## 2023-04-07 VITALS — BP 140/80 | HR 87 | Resp 16 | Ht 70.0 in | Wt 170.2 lb

## 2023-04-07 DIAGNOSIS — E7800 Pure hypercholesterolemia, unspecified: Secondary | ICD-10-CM | POA: Diagnosis not present

## 2023-04-07 DIAGNOSIS — I25118 Atherosclerotic heart disease of native coronary artery with other forms of angina pectoris: Secondary | ICD-10-CM | POA: Diagnosis not present

## 2023-04-07 DIAGNOSIS — I10 Essential (primary) hypertension: Secondary | ICD-10-CM

## 2023-04-07 NOTE — Patient Instructions (Signed)

## 2023-04-07 NOTE — Progress Notes (Signed)
Cardiology Office Note:  .   Date:  04/07/2023  ID:  Edwin Asal., DOB 1945-12-03, MRN [id number] PCP: Georgina Quint, MD   [facility] Providers Cardiologist:  Yates Decamp, MD   History of Present Illness: .   Edwin Evert. is a 78 y.o. Caucasian male with coronary artery disease with history of non-STEMI  04/25/2014 S/P stenting of the proximal and mid LAD and balloon angioplasty of the D1, mild diffuse disease in other vessels, LVEF 50-55%. He also has hypertension, hyperglycemia and mild hyperlipidemia.  Due to typical atrial flutter, underwent atrial flutter ablation on 02/24/2021.  He remains asymptomatic except for back pain and has occasional sharp pain lasting a few seconds in his chest.  Discussed the use of AI scribe software for clinical note transcription with the patient, who gave verbal consent to proceed.  History of Present Illness   The patient, with a history of coronary artery disease, hypertension, and hypercholesterolemia, presents with ongoing back pain. He has been evaluated with x-rays and MRIs, which revealed 'old man back, osteoarthritis.' He was prescribed Celebrex and Prednisone for the pain, but expresses concerns about the potential cardiac effects of these medications. He also reports occasional dizziness or weakness, which has been a recurring issue for several years. The patient has been monitoring his blood pressure at home, which has been slightly elevated recently, possibly due to the back pain. He also mentions a recurring ache and occasional sharp pain in the chest, which is quick and not severe. The patient has been trying to maintain physical activity through yoga and walking, but the back pain has limited these activities recently.       Labs   Lab Results  Component Value Date   CHOL 133 07/20/2022   HDL 46.00 07/20/2022   LDLCALC 69 07/20/2022   LDLDIRECT 69 02/15/2019   TRIG 91.0 07/20/2022   CHOLHDL 3 07/20/2022   Lab  Results  Component Value Date   NA 140 07/20/2022   K 4.7 07/20/2022   CO2 29 07/20/2022   GLUCOSE 94 07/20/2022   BUN 14 07/20/2022   CREATININE 1.25 07/20/2022   CALCIUM 9.9 07/20/2022   GFR 56.74 (L) 07/20/2022   EGFR 65 11/10/2021   GFRNONAA 58 (L) 12/19/2020      Latest Ref Rng & Units 07/20/2022    3:07 PM 04/09/2022   10:30 AM 11/10/2021    1:58 PM  BMP  Glucose 70 - 99 mg/dL 94  284  91   BUN 6 - 23 mg/dL 14  15  16    Creatinine 0.40 - 1.50 mg/dL 1.32  4.40  1.02   BUN/Creat Ratio 10 - 24   14   Sodium 135 - 145 mEq/L 140  141  140   Potassium 3.5 - 5.1 mEq/L 4.7  4.2  4.5   Chloride 96 - 112 mEq/L 103  105  102   CO2 19 - 32 mEq/L 29  28  26    Calcium 8.4 - 10.5 mg/dL 9.9  9.4  9.7       Latest Ref Rng & Units 07/20/2022    3:07 PM 04/09/2022   10:30 AM 04/02/2021   10:12 AM  CBC  WBC 4.0 - 10.5 K/uL 7.7  6.6  6.3   Hemoglobin 13.0 - 17.0 g/dL 72.5  36.6  44.0   Hematocrit 39.0 - 52.0 % 40.2  39.2  43.3   Platelets 150.0 - 400.0 K/uL 263.0  235.0  208.0    Review of Systems  Cardiovascular:  Negative for chest pain, dyspnea on exertion and leg swelling.  Musculoskeletal:  Positive for back pain and joint pain.    Physical Exam:   VS:  BP (!) 140/80 (BP Location: Left Arm, Patient Position: Sitting, Cuff Size: Normal)   Pulse 87   Resp 16   Ht 5\' 10"  (1.778 m)   Wt 170 lb 3.2 oz (77.2 kg)   SpO2 96%   BMI 24.42 kg/m    Wt Readings from Last 3 Encounters:  04/07/23 170 lb 3.2 oz (77.2 kg)  11/19/22 161 lb 3.2 oz (73.1 kg)  10/07/22 162 lb 6.4 oz (73.7 kg)     Physical Exam Neck:     Vascular: No carotid bruit or JVD.  Cardiovascular:     Rate and Rhythm: Normal rate and regular rhythm.     Pulses: Intact distal pulses.     Heart sounds: Normal heart sounds. No murmur heard.    No gallop.  Pulmonary:     Effort: Pulmonary effort is normal.     Breath sounds: Normal breath sounds.  Abdominal:     General: Bowel sounds are normal.     Palpations:  Abdomen is soft.  Musculoskeletal:     Right lower leg: No edema.     Left lower leg: No edema.    Studies Reviewed: Marland Kitchen    Exercise nuclear stress test 06/25/2022: There is a small fixed mild defect in the septal and apical regions.  Overall function is abnormal with mild septal hypokinesis. Stress LV EF: 47%.  Abnormal ECG stress. The patient exercised for 8 minutes and 0 seconds of a Bruce protocol, achieving approximately 10.14 METs & 90% MPHR. No chest pain. Stress terminated due to THR achieved. The blood pressure response was normal. Compared to previous study: 10/21/2020 mild perfusion abnormality is new. Previously calculated LVEF is 48% and visually appeared to be normal. Previously patient exercised for 7 minutes and 30 seconds and achieved 7.34 METS Intermediate risk study.   EKG:    EKG Interpretation Date/Time:  Wednesday April 07 2023 14:57:38 EST Ventricular Rate:  66 PR Interval:  212 QRS Duration:  138 QT Interval:  382 QTC Calculation: 400 R Axis:   56  Text Interpretation: EKG 04/07/2023: Sinus rhythm with first-degree AV block at rate of 66 bpm, right bundle branch block.  No evidence of ischemia.  Compared to 05/27/2022, no significant change. Confirmed by Edwin Mckee [phone]) on 04/07/2023 3:10:11 PM    Medications and allergies    No Known Allergies   Current Outpatient Medications:    acetaminophen (TYLENOL) 500 MG tablet, Take 1,000 mg by mouth every 8 (eight) hours as needed for moderate pain., Disp: , Rfl:    aspirin (ASPIRIN CHILDRENS) 81 MG chewable tablet, Chew 1 tablet (81 mg total) by mouth daily., Disp: , Rfl:    atorvastatin (LIPITOR) 40 MG tablet, Take 1 tablet (40 mg total) by mouth daily., Disp: 90 tablet, Rfl: 2   chlorthalidone (HYGROTON) 25 MG tablet, TAKE 1 TABLET BY MOUTH EVERY DAY IN THE MORNING, Disp: 90 tablet, Rfl: 3   diltiazem (CARDIZEM CD) 120 MG 24 hr capsule, TAKE 1 CAPSULE (120 MG TOTAL) BY MOUTH EVERY EVENING, Disp: 90  capsule, Rfl: 3   valsartan (DIOVAN) 320 MG tablet, Take 1 tablet (320 mg total) by mouth daily., Disp: 90 tablet, Rfl: 3   ASSESSMENT AND PLAN: .      ICD-10-CM  1. Coronary artery disease of native artery of native heart with stable angina pectoris (HCC)  I25.118 EKG 12-Lead    2. Primary hypertension  I10     3. Hypercholesteremia  E78.00       1. Coronary artery disease of native artery of native heart with stable angina pectoris [facility]) Patient presents for annual visit for coronary disease and had significant questions especially for managing his back.  From CAD standpoint he remains stable, he has had occasional sharp chest pain that does not suggest angina but musculoskeletal pain.  He is on appropriate medical therapy including high intensity statin, with lipids well-controlled with goal <70.  He is also on valsartan 320 mg daily.  Aspirin 81 mg daily.  Continue the same.  Back pain managed with Celebrex and Prednisone. Discussed the risks/benefits of these medications in relation to heart health and overall quality of life. -Continue Celebrex at the lowest effective dose, potentially skipping a day if symptoms allow. -Short-term use of Prednisone for flare-ups is acceptable. Long-term use should be carefully considered due to potential side effects.  2. Primary hypertension Blood pressure today was slightly elevated, patient is presenting back pain.  He recently also finished a course of steroids.  Blood pressure elevation could be related to this.  Home blood pressure recordings have been mostly <130/75 to 78 mmHg.  Continue to monitor.  3. Hypercholesteremia Reviewed his external labs, LDL is at goal.  He is tolerating high intensity statin without any complications.  No changes in the medications were done today, I will see him back in a year.  He will contact me if his blood pressure remains >130/80 mmHg.  Signed,  Yates Decamp, MD, [facility] 04/07/2023, 3:24 PM [facility]  [facility] [address] [place], [place] [address] Phone: [phone]. Fax:  [phone]

## 2023-04-12 ENCOUNTER — Encounter: Payer: Federal, State, Local not specified - PPO | Admitting: Emergency Medicine

## 2023-06-13 ENCOUNTER — Other Ambulatory Visit: Payer: Self-pay | Admitting: Cardiology

## 2023-06-13 DIAGNOSIS — I10 Essential (primary) hypertension: Secondary | ICD-10-CM

## 2023-06-16 ENCOUNTER — Telehealth: Payer: Self-pay | Admitting: Cardiology

## 2023-06-16 DIAGNOSIS — I10 Essential (primary) hypertension: Secondary | ICD-10-CM

## 2023-06-16 DIAGNOSIS — I25118 Atherosclerotic heart disease of native coronary artery with other forms of angina pectoris: Secondary | ICD-10-CM

## 2023-06-16 DIAGNOSIS — E7800 Pure hypercholesterolemia, unspecified: Secondary | ICD-10-CM

## 2023-06-16 MED ORDER — CHLORTHALIDONE 25 MG PO TABS
25.0000 mg | ORAL_TABLET | Freq: Every morning | ORAL | 2 refills | Status: DC
Start: 2023-06-16 — End: 2023-11-16

## 2023-06-16 MED ORDER — ATORVASTATIN CALCIUM 40 MG PO TABS
40.0000 mg | ORAL_TABLET | Freq: Every day | ORAL | 2 refills | Status: DC
Start: 2023-06-16 — End: 2023-11-16

## 2023-06-16 MED ORDER — VALSARTAN 320 MG PO TABS
320.0000 mg | ORAL_TABLET | Freq: Every day | ORAL | 2 refills | Status: DC
Start: 2023-06-16 — End: 2023-11-16

## 2023-06-16 MED ORDER — DILTIAZEM HCL ER COATED BEADS 120 MG PO CP24
120.0000 mg | ORAL_CAPSULE | Freq: Every evening | ORAL | 2 refills | Status: DC
Start: 2023-06-16 — End: 2023-11-17

## 2023-06-16 NOTE — Telephone Encounter (Signed)
*  STAT* If patient is at the pharmacy, call can be transferred to refill team.   1. Which medications need to be refilled? (please list name of each medication and dose if known) atorvastatin (LIPITOR) 40 MG tablet   chlorthalidone (HYGROTON) 25 MG tablet    diltiazem (CARDIZEM CD) 120 MG 24 hr capsule    valsartan (DIOVAN) 320 MG tablet   2. Which pharmacy/location (including street and city if local pharmacy) is medication to be sent to? [facility] - [place], [place] - [address] [place]   3. Do they need a 30 day or 90 day supply? 90

## 2023-06-16 NOTE — Telephone Encounter (Signed)
 Pt's medications were sent to pt's pharmacy as requested. Confirmation received.

## 2023-07-22 ENCOUNTER — Encounter: Admitting: Emergency Medicine

## 2023-07-22 ENCOUNTER — Ambulatory Visit: Admitting: Emergency Medicine

## 2023-07-22 ENCOUNTER — Encounter: Payer: Self-pay | Admitting: Emergency Medicine

## 2023-07-22 VITALS — BP 134/78 | HR 60 | Temp 97.9°F | Ht 70.0 in | Wt 161.0 lb

## 2023-07-22 DIAGNOSIS — Z130 Encounter for screening for diseases of the blood and blood-forming organs and certain disorders involving the immune mechanism: Secondary | ICD-10-CM

## 2023-07-22 DIAGNOSIS — Z0000 Encounter for general adult medical examination without abnormal findings: Secondary | ICD-10-CM | POA: Diagnosis not present

## 2023-07-22 DIAGNOSIS — I2510 Atherosclerotic heart disease of native coronary artery without angina pectoris: Secondary | ICD-10-CM | POA: Diagnosis not present

## 2023-07-22 DIAGNOSIS — I10 Essential (primary) hypertension: Secondary | ICD-10-CM | POA: Diagnosis not present

## 2023-07-22 DIAGNOSIS — Z13228 Encounter for screening for other metabolic disorders: Secondary | ICD-10-CM

## 2023-07-22 DIAGNOSIS — Z1329 Encounter for screening for other suspected endocrine disorder: Secondary | ICD-10-CM | POA: Diagnosis not present

## 2023-07-22 DIAGNOSIS — E785 Hyperlipidemia, unspecified: Secondary | ICD-10-CM

## 2023-07-22 DIAGNOSIS — I259 Chronic ischemic heart disease, unspecified: Secondary | ICD-10-CM

## 2023-07-22 LAB — LIPID PANEL
Cholesterol: 135 mg/dL (ref 0–200)
HDL: 49 mg/dL (ref >39.00)
LDL Cholesterol: 73 mg/dL (ref 0–99)
NonHDL: 85.56
Total CHOL/HDL Ratio: 3
Triglycerides: 63 mg/dL (ref 0.0–149.0)
VLDL: 12.6 mg/dL (ref 0.0–40.0)

## 2023-07-22 LAB — CBC WITH DIFFERENTIAL/PLATELET
Basophils Absolute: 0 10*3/uL (ref 0.0–0.1)
Basophils Relative: 0.6 % (ref 0.0–3.0)
Eosinophils Absolute: 0.1 10*3/uL (ref 0.0–0.7)
Eosinophils Relative: 1.2 % (ref 0.0–5.0)
HCT: 40.2 % (ref 39.0–52.0)
Hemoglobin: 13.4 g/dL (ref 13.0–17.0)
Lymphocytes Relative: 28.9 % (ref 12.0–46.0)
Lymphs Abs: 1.9 10*3/uL (ref 0.7–4.0)
MCHC: 33.4 g/dL (ref 30.0–36.0)
MCV: 88.9 fl (ref 78.0–100.0)
Monocytes Absolute: 0.6 10*3/uL (ref 0.1–1.0)
Monocytes Relative: 8.3 % (ref 3.0–12.0)
Neutro Abs: 4 10*3/uL (ref 1.4–7.7)
Neutrophils Relative %: 61 % (ref 43.0–77.0)
Platelets: 259 10*3/uL (ref 150.0–400.0)
RBC: 4.52 Mil/uL (ref 4.22–5.81)
RDW: 13.5 % (ref 11.5–15.5)
WBC: 6.6 10*3/uL (ref 4.0–10.5)

## 2023-07-22 LAB — COMPREHENSIVE METABOLIC PANEL WITH GFR
ALT: 19 U/L (ref 0–53)
AST: 26 U/L (ref 0–37)
Albumin: 4.4 g/dL (ref 3.5–5.2)
Alkaline Phosphatase: 56 U/L (ref 39–117)
BUN: 21 mg/dL (ref 6–23)
CO2: 30 meq/L (ref 19–32)
Calcium: 9.5 mg/dL (ref 8.4–10.5)
Chloride: 102 meq/L (ref 96–112)
Creatinine, Ser: 1.21 mg/dL (ref 0.40–1.50)
GFR: 58.59 mL/min — ABNORMAL LOW (ref >60.00)
Glucose, Bld: 107 mg/dL — ABNORMAL HIGH (ref 70–99)
Potassium: 4.8 meq/L (ref 3.5–5.1)
Sodium: 138 meq/L (ref 135–145)
Total Bilirubin: 0.5 mg/dL (ref 0.2–1.2)
Total Protein: 6.8 g/dL (ref 6.0–8.3)

## 2023-07-22 LAB — HEMOGLOBIN A1C: Hgb A1c MFr Bld: 5.9 % (ref 4.6–6.5)

## 2023-07-22 NOTE — Assessment & Plan Note (Signed)
 BP Readings from Last 3 Encounters:  07/22/23 134/78  04/07/23 (!) 140/80  11/19/22 116/78  Well-controlled hypertension Continue chlorthalidone  25 mg daily, Cardizem  120 mg daily and valsartan  320 milligrams daily

## 2023-07-22 NOTE — Progress Notes (Signed)
 Edwin Mckee. 78 y.o.   Chief Complaint  Patient presents with   Annual Exam    Patient here for physical and medication refill. Patient states wanting to get back on fluoxetine  he says he's been off for awhile he had some old pills left and is started back on it . Also has other questions he wanted to ask the provider. Also wanting to know if the vaccine due is recommended     HISTORY OF PRESENT ILLNESS: This is a 78 y.o. adult here for annual exam and follow-up on multiple chronic medical problems Overall doing well. Recently restarted taking fluoxetine  every day and is helping Fatigue and malaise from last office visit much improved No other complaints or medical concerns today. Wt Readings from Last 3 Encounters:  07/22/23 161 lb (73 kg)  04/07/23 170 lb 3.2 oz (77.2 kg)  11/19/22 161 lb 3.2 oz (73.1 kg)     HPI   Prior to Admission medications   Medication Sig Start Date End Date Taking? Authorizing Provider  acetaminophen  (TYLENOL ) 500 MG tablet Take 1,000 mg by mouth every 8 (eight) hours as needed for moderate pain.   Yes [provider]  aspirin  (ASPIRIN  CHILDRENS) 81 MG chewable tablet Chew 1 tablet (81 mg total) by mouth daily. 10/15/21  Yes Knox Perl, MD  atorvastatin  (LIPITOR ) 40 MG tablet Take 1 tablet (40 mg total) by mouth daily. 06/16/23  Yes Knox Perl, MD  chlorthalidone  (HYGROTON ) 25 MG tablet Take 1 tablet (25 mg total) by mouth in the morning. 06/16/23  Yes Knox Perl, MD  diltiazem  (CARDIZEM  CD) 120 MG 24 hr capsule Take 1 capsule (120 mg total) by mouth every evening. 06/16/23  Yes Knox Perl, MD  FLUoxetine  (PROZAC ) 40 MG capsule Take 40 mg by mouth daily.   Yes [provider]  valsartan  (DIOVAN ) 320 MG tablet Take 1 tablet (320 mg total) by mouth daily. 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   Long term (current) use of anticoagulants     Benign hypertension    Mixed hyperlipidemia    Atherosclerosis of native coronary artery of native heart without angina pectoris    History of coronary artery stent placement    IHD (ischemic heart disease) 12/16/2016   History of MI (myocardial infarction) 12/16/2016   History of depression 12/16/2016   Hearing loss 05/04/2012   Dyslipidemia 01/26/2008   Essential hypertension 01/26/2008   INGUINAL HERNIA, HX OF 01/26/2008    Past Medical History:  Diagnosis Date   Arthritis    Depression    Depression    Phreesia 01/03/2020   Hyperlipidemia    controlled with meds    Hypertension    NSTEMI (non-ST elevated myocardial infarction) (HCC) 04/25/2014   Postmenopausal    Substance abuse (HCC)     Past Surgical History:  Procedure Laterality Date   A-FLUTTER ABLATION N/A 02/24/2021   Procedure: A-FLUTTER ABLATION;  Surgeon: Boyce Byes, MD;  Location: [facility] INVASIVE CV LAB;  Service: Cardiovascular;  Laterality: N/A;   COLONOSCOPY     HERNIA REPAIR     LEFT HEART CATHETERIZATION WITH CORONARY ANGIOGRAM N/A 04/25/2014   Procedure: LEFT HEART CATHETERIZATION WITH CORONARY ANGIOGRAM;  Surgeon: Jessica Morn, MD;  Location: [facility] CATH LAB;  Service: Cardiovascular;  Laterality: N/A; with stents placement    POLYPECTOMY     VASECTOMY  Social History   Socioeconomic History   Marital status: Married    Spouse name: Not on file   Number of children: 2   Years of education: Not on file   Highest education level: 12th grade  Occupational History   Not on file  Tobacco Use   Smoking status: Former    Current packs/day: 0.00    Average packs/day: 0.5 packs/day for 10.0 years (5.0 ttl pk-yrs)    Types: Cigarettes    Start date: 04/12/1972    Quit date: 04/12/1982    Years since quitting: 41.3   Smokeless tobacco: Never  Vaping Use   Vaping status: Never Used  Substance and Sexual Activity   Alcohol use: No    Alcohol/week: 0.0 standard drinks of alcohol   Drug use:  No   Sexual activity: Yes  Other Topics Concern   Not on file  Social History Narrative   Not on file   Social Drivers of Health   Financial Resource Strain: Low Risk  (07/20/2023)   Overall Financial Resource Strain (CARDIA)    Difficulty of Paying Living Expenses: Not very hard  Food Insecurity: No Food Insecurity (07/20/2023)   Hunger Vital Sign    Worried About Running Out of Food in the Last Year: Never true    Ran Out of Food in the Last Year: Never true  Transportation Needs: No Transportation Needs (07/20/2023)   PRAPARE - Administrator, Civil Service (Medical): No    Lack of Transportation (Non-Medical): No  Physical Activity: Insufficiently Active (07/20/2023)   Exercise Vital Sign    Days of Exercise per Week: 3 days    Minutes of Exercise per Session: 40 min  Stress: Stress Concern Present (07/20/2023)   [employer] of Occupational Health - Occupational Stress Questionnaire    Feeling of Stress : To some extent  Social Connections: Moderately Integrated (07/20/2023)   Social Connection and Isolation Panel [NHANES]    Frequency of Communication with Friends and Family: Once a week    Frequency of Social Gatherings with Friends and Family: Once a week    Attends Religious Services: More than 4 times per year    Active Member of [employer] or Organizations: Yes    Attends Engineer, structural: More than 4 times per year    Marital Status: Married  Catering manager Violence: Not on file    Family History  Problem Relation Age of Onset   Cancer Mother        lung   Hypertension Father    Alzheimer's disease Father    Heart disease Sister    Heart disease Brother    Stroke Maternal Grandmother    Alcohol abuse Maternal Grandfather    Cancer Maternal Grandfather    Stroke Paternal Grandmother    Cancer Paternal Grandfather    Colon cancer Neg Hx    Colon polyps Neg Hx    Esophageal cancer Neg Hx    Rectal cancer Neg Hx    Stomach cancer Neg Hx       Review of Systems  Constitutional: Negative.  Negative for chills and fever.  HENT: Negative.  Negative for congestion and sore throat.   Respiratory: Negative.  Negative for cough and shortness of breath.   Cardiovascular: Negative.  Negative for chest pain and palpitations.  Gastrointestinal:  Negative for abdominal pain, diarrhea, nausea and vomiting.  Genitourinary: Negative.  Negative for dysuria and hematuria.  Skin: Negative.  Negative for rash.  Neurological: Negative.  Negative for dizziness and headaches.  All other systems reviewed and are negative.   Vitals:   07/22/23 1002  BP: 134/78  Pulse: 60  Temp: 97.9 F (36.6 C)  SpO2: 97%    Physical Exam Vitals reviewed.  Constitutional:      Appearance: Normal appearance.  HENT:     Head: Normocephalic.     Mouth/Throat:     Mouth: Mucous membranes are moist.     Pharynx: Oropharynx is clear.  Eyes:     Extraocular Movements: Extraocular movements intact.     Conjunctiva/sclera: Conjunctivae normal.     Pupils: Pupils are equal, round, and reactive to light.  Cardiovascular:     Rate and Rhythm: Normal rate and regular rhythm.     Pulses: Normal pulses.     Heart sounds: Normal heart sounds.  Pulmonary:     Effort: Pulmonary effort is normal.     Breath sounds: Normal breath sounds.  Abdominal:     Palpations: Abdomen is soft.     Tenderness: There is no abdominal tenderness.  Musculoskeletal:     Cervical back: No tenderness.  Lymphadenopathy:     Cervical: No cervical adenopathy.  Skin:    General: Skin is warm and dry.     Capillary Refill: Capillary refill takes less than 2 seconds.  Neurological:     General: No focal deficit present.     Mental Status: He is alert and oriented to person, place, and time.  Psychiatric:        Mood and Affect: Mood normal.        Behavior: Behavior normal.      ASSESSMENT & PLAN: Problem List Items Addressed This Visit       Cardiovascular and  Mediastinum   Essential hypertension   BP Readings from Last 3 Encounters:  07/22/23 134/78  04/07/23 (!) 140/80  11/19/22 116/78  Well-controlled hypertension Continue chlorthalidone  25 mg daily, Cardizem  120 mg daily and valsartan  320 milligrams daily       Relevant Orders   CBC with Differential/Platelet   Comprehensive metabolic panel with GFR   Hemoglobin A1c   Lipid panel   IHD (ischemic heart disease)   Atherosclerosis of native coronary artery of native heart without angina pectoris   Stable.  Most recent cardiology assessment reviewed. History of stent placement. No anginal episodes.  Stable.  No concerns.      Relevant Orders   Lipid panel     Other   Dyslipidemia   Chronic stable condition Lipid profile done today Continue atorvastatin  40 mg daily      Relevant Orders   Comprehensive metabolic panel with GFR   Lipid panel   Other Visit Diagnoses       Routine general medical examination at a health care facility    -  Primary   Relevant Orders   CBC with Differential/Platelet   Comprehensive metabolic panel with GFR   Hemoglobin A1c   Lipid panel     Screening for deficiency anemia       Relevant Orders   CBC with Differential/Platelet     Screening for endocrine, metabolic and immunity disorder       Relevant Orders   Comprehensive metabolic panel with GFR   Hemoglobin A1c      Modifiable risk factors discussed with patient. Anticipatory guidance according to age provided. The following topics were also discussed: Social Determinants of Health Smoking.  Non-smoker Diet and nutrition Benefits of exercise  Cancer screening and review of most recent colonoscopy report from 2022 Vaccinations review and recommendations Cardiovascular risk assessment Mental health including depression and anxiety Fall and accident prevention  Patient Instructions  Health Maintenance After Age 12 After age 3, you are at a higher risk for certain long-term  diseases and infections as well as injuries from falls. Falls are a major cause of broken bones and head injuries in people who are older than age 31. Getting regular preventive care can help to keep you healthy and well. Preventive care includes getting regular testing and making lifestyle changes as recommended by your health care provider. Talk with your health care provider about: Which screenings and tests you should have. A screening is a test that checks for a disease when you have no symptoms. A diet and exercise plan that is right for you. What should I know about screenings and tests to prevent falls? Screening and testing are the best ways to find a health problem early. Early diagnosis and treatment give you the best chance of managing medical conditions that are common after age 31. Certain conditions and lifestyle choices may make you more likely to have a fall. Your health care provider may recommend: Regular vision checks. Poor vision and conditions such as cataracts can make you more likely to have a fall. If you wear glasses, make sure to get your prescription updated if your vision changes. Medicine review. Work with your health care provider to regularly review all of the medicines you are taking, including over-the-counter medicines. Ask your health care provider about any side effects that may make you more likely to have a fall. Tell your health care provider if any medicines that you take make you feel dizzy or sleepy. Strength and balance checks. Your health care provider may recommend certain tests to check your strength and balance while standing, walking, or changing positions. Foot health exam. Foot pain and numbness, as well as not wearing proper footwear, can make you more likely to have a fall. Screenings, including: Osteoporosis screening. Osteoporosis is a condition that causes the bones to get weaker and break more easily. Blood pressure screening. Blood pressure changes  and medicines to control blood pressure can make you feel dizzy. Depression screening. You may be more likely to have a fall if you have a fear of falling, feel depressed, or feel unable to do activities that you used to do. Alcohol use screening. Using too much alcohol can affect your balance and may make you more likely to have a fall. Follow these instructions at home: Lifestyle Do not drink alcohol if: Your health care provider tells you not to drink. If you drink alcohol: Limit how much you have to: 0-1 drink a day for women. 0-2 drinks a day for men. Know how much alcohol is in your drink. In the [place] one drink equals one 12 oz bottle of beer (355 mL), one 5 oz glass of wine (148 mL), or one 1 oz glass of hard liquor (44 mL). Do not use any products that contain nicotine or tobacco. These products include cigarettes, chewing tobacco, and vaping devices, such as e-cigarettes. If you need help quitting, ask your health care provider. Activity  Follow a regular exercise program to stay fit. This will help you maintain your balance. Ask your health care provider what types of exercise are appropriate for you. If you need a cane or walker, use it as recommended by your health care provider. Wear supportive shoes that  have nonskid soles. Safety  Remove any tripping hazards, such as rugs, cords, and clutter. Install safety equipment such as grab bars in bathrooms and safety rails on stairs. Keep rooms and walkways well-lit. General instructions Talk with your health care provider about your risks for falling. Tell your health care provider if: You fall. Be sure to tell your health care provider about all falls, even ones that seem minor. You feel dizzy, tiredness (fatigue), or off-balance. Take over-the-counter and prescription medicines only as told by your health care provider. These include supplements. Eat a healthy diet and maintain a healthy weight. A healthy diet includes low-fat  dairy products, low-fat (lean) meats, and fiber from whole grains, beans, and lots of fruits and vegetables. Stay current with your vaccines. Schedule regular health, dental, and eye exams. Summary Having a healthy lifestyle and getting preventive care can help to protect your health and wellness after age 71. Screening and testing are the best way to find a health problem early and help you avoid having a fall. Early diagnosis and treatment give you the best chance for managing medical conditions that are more common for people who are older than age 23. Falls are a major cause of broken bones and head injuries in people who are older than age 72. Take precautions to prevent a fall at home. Work with your health care provider to learn what changes you can make to improve your health and wellness and to prevent falls. This information is not intended to replace advice given to you by your health care provider. Make sure you discuss any questions you have with your health care provider. Document Revised: 07/22/2020 Document Reviewed: 07/22/2020 Elsevier Patient Education  2024 Elsevier Inc.     Maryagnes Small, MD [facility]

## 2023-07-22 NOTE — Assessment & Plan Note (Signed)
 Stable.  Most recent cardiology assessment reviewed. History of stent placement. No anginal episodes.  Stable.  No concerns.

## 2023-07-22 NOTE — Assessment & Plan Note (Signed)
Chronic stable condition. Lipid profile done today Continue atorvastatin 40 mg daily

## 2023-07-22 NOTE — Patient Instructions (Signed)
 Health Maintenance After Age 76 After age 4, you are at a higher risk for certain long-term diseases and infections as well as injuries from falls. Falls are a major cause of broken bones and head injuries in people who are older than age 47. Getting regular preventive care can help to keep you healthy and well. Preventive care includes getting regular testing and making lifestyle changes as recommended by your health care provider. Talk with your health care provider about: Which screenings and tests you should have. A screening is a test that checks for a disease when you have no symptoms. A diet and exercise plan that is right for you. What should I know about screenings and tests to prevent falls? Screening and testing are the best ways to find a health problem early. Early diagnosis and treatment give you the best chance of managing medical conditions that are common after age 37. Certain conditions and lifestyle choices may make you more likely to have a fall. Your health care provider may recommend: Regular vision checks. Poor vision and conditions such as cataracts can make you more likely to have a fall. If you wear glasses, make sure to get your prescription updated if your vision changes. Medicine review. Work with your health care provider to regularly review all of the medicines you are taking, including over-the-counter medicines. Ask your health care provider about any side effects that may make you more likely to have a fall. Tell your health care provider if any medicines that you take make you feel dizzy or sleepy. Strength and balance checks. Your health care provider may recommend certain tests to check your strength and balance while standing, walking, or changing positions. Foot health exam. Foot pain and numbness, as well as not wearing proper footwear, can make you more likely to have a fall. Screenings, including: Osteoporosis screening. Osteoporosis is a condition that causes  the bones to get weaker and break more easily. Blood pressure screening. Blood pressure changes and medicines to control blood pressure can make you feel dizzy. Depression screening. You may be more likely to have a fall if you have a fear of falling, feel depressed, or feel unable to do activities that you used to do. Alcohol use screening. Using too much alcohol can affect your balance and may make you more likely to have a fall. Follow these instructions at home: Lifestyle Do not drink alcohol if: Your health care provider tells you not to drink. If you drink alcohol: Limit how much you have to: 0-1 drink a day for women. 0-2 drinks a day for men. Know how much alcohol is in your drink. In the [place] one drink equals one 12 oz bottle of beer (355 mL), one 5 oz glass of wine (148 mL), or one 1 oz glass of hard liquor (44 mL). Do not use any products that contain nicotine or tobacco. These products include cigarettes, chewing tobacco, and vaping devices, such as e-cigarettes. If you need help quitting, ask your health care provider. Activity  Follow a regular exercise program to stay fit. This will help you maintain your balance. Ask your health care provider what types of exercise are appropriate for you. If you need a cane or walker, use it as recommended by your health care provider. Wear supportive shoes that have nonskid soles. Safety  Remove any tripping hazards, such as rugs, cords, and clutter. Install safety equipment such as grab bars in bathrooms and safety rails on stairs. Keep rooms and walkways  well-lit. General instructions Talk with your health care provider about your risks for falling. Tell your health care provider if: You fall. Be sure to tell your health care provider about all falls, even ones that seem minor. You feel dizzy, tiredness (fatigue), or off-balance. Take over-the-counter and prescription medicines only as told by your health care provider. These include  supplements. Eat a healthy diet and maintain a healthy weight. A healthy diet includes low-fat dairy products, low-fat (lean) meats, and fiber from whole grains, beans, and lots of fruits and vegetables. Stay current with your vaccines. Schedule regular health, dental, and eye exams. Summary Having a healthy lifestyle and getting preventive care can help to protect your health and wellness after age 11. Screening and testing are the best way to find a health problem early and help you avoid having a fall. Early diagnosis and treatment give you the best chance for managing medical conditions that are more common for people who are older than age 28. Falls are a major cause of broken bones and head injuries in people who are older than age 48. Take precautions to prevent a fall at home. Work with your health care provider to learn what changes you can make to improve your health and wellness and to prevent falls. This information is not intended to replace advice given to you by your health care provider. Make sure you discuss any questions you have with your health care provider. Document Revised: 07/22/2020 Document Reviewed: 07/22/2020 Elsevier Patient Education  2024 [employer].

## 2023-07-26 ENCOUNTER — Other Ambulatory Visit: Payer: Self-pay | Admitting: Emergency Medicine

## 2023-07-26 ENCOUNTER — Telehealth: Payer: Self-pay | Admitting: Emergency Medicine

## 2023-07-26 MED ORDER — FLUOXETINE HCL 40 MG PO CAPS: 40.0000 mg | ORAL_CAPSULE | Freq: Every day | ORAL | 3 refills | Status: AC

## 2023-07-26 NOTE — Telephone Encounter (Signed)
 Copied from CRM [id number]. Topic: Clinical - Medication Question >> Jul 26, 2023  9:41 AM Clydene Darner H wrote: Reason for CRM: Patient called to follow up on prescription for Prozac . He was last seen on 05/08 and stated that, as of now, his home delivery pharmacy has not received the prescription request. Patient is requesting a follow up on the status.

## 2023-07-26 NOTE — Telephone Encounter (Signed)
 New prescription for Prozac  sent to mail pharmacy requested today.  Thanks.

## 2023-09-16 ENCOUNTER — Ambulatory Visit: Admitting: Emergency Medicine

## 2023-09-16 VITALS — BP 128/64 | HR 56 | Temp 98.2°F | Ht 70.0 in | Wt 161.0 lb

## 2023-09-16 DIAGNOSIS — I2510 Atherosclerotic heart disease of native coronary artery without angina pectoris: Secondary | ICD-10-CM

## 2023-09-16 DIAGNOSIS — I10 Essential (primary) hypertension: Secondary | ICD-10-CM | POA: Diagnosis not present

## 2023-09-16 DIAGNOSIS — R531 Weakness: Secondary | ICD-10-CM | POA: Diagnosis not present

## 2023-09-16 DIAGNOSIS — I259 Chronic ischemic heart disease, unspecified: Secondary | ICD-10-CM

## 2023-09-16 DIAGNOSIS — N1831 Chronic kidney disease, stage 3a: Secondary | ICD-10-CM

## 2023-09-16 NOTE — Progress Notes (Signed)
 Edwin E Maddix Jr. 78 y.o.   Chief Complaint  Patient presents with   Fatigue    Patient states Edwin Mckee is not feeling well since last Wednesday, Edwin Mckee checks his bp twice a day and states its been very low lately. Edwin Mckee mentions Edwin Mckee's been staying well hydrated, Edwin Mckee's been shaky, heads been fuzzy, sleepy, neck ache, legs has been tingling, his stomach feels like its moving states Edwin Mckee is regular with bowel movement but Tuesday Edwin Mckee had loose stools and went to the bathroom 4-5 times that day. Edwin Mckee mentions when Edwin Mckee wiped Edwin Mckee did have a clot which states it has happened before, has hx of hemorrhoids    HISTORY OF PRESENT ILLNESS: This is a 78 y.o. adult started feeling not well but 1 week ago.  Possible dehydration Was able to play golf last Wednesday or Friday.  Last Tuesday was feeling real bad Not sleeping good.  Feels weak and shaky.  Head is fuzzy.  Had some neck pain earlier in the week Feels like stomach is moving around.  Had diarrhea 2 weeks ago Symptoms better today and not debilitating History of high blood pressure since early age. Some blood pressure readings at home have been low.  Presently on Hygroton , diltiazem , and valsartan  No changes in diet or nutrition.  Has been keeping up with his fluid intake No flulike symptoms.  No fever or chills. No abnormal rashes or tick bites. No other complaints or medical concerns today.  BP Readings from Last 3 Encounters:  09/16/23 128/64  07/22/23 134/78  04/07/23 (!) 140/80     HPI   Prior to Admission medications   Medication Sig Start Date End Date Taking? Authorizing Provider  acetaminophen  (TYLENOL ) 500 MG tablet Take 1,000 mg by mouth every 8 (eight) hours as needed for moderate pain.   Yes [provider]  aspirin  (ASPIRIN  CHILDRENS) 81 MG chewable tablet Chew 1 tablet (81 mg total) by mouth daily. 10/15/21  Yes Ladona Heinz, MD  atorvastatin  (LIPITOR ) 40 MG tablet Take 1 tablet (40 mg total) by mouth daily. 06/16/23  Yes Ladona Heinz,  MD  chlorthalidone  (HYGROTON ) 25 MG tablet Take 1 tablet (25 mg total) by mouth in the morning. 06/16/23  Yes Ladona Heinz, MD  diltiazem  (CARDIZEM  CD) 120 MG 24 hr capsule Take 1 capsule (120 mg total) by mouth every evening. 06/16/23  Yes Ladona Heinz, MD  FLUoxetine  (PROZAC ) 40 MG capsule Take 1 capsule (40 mg total) by mouth daily. 07/26/23  Yes Chessica Audia, Emil Schanz, MD  valsartan  (DIOVAN ) 320 MG tablet Take 1 tablet (320 mg total) by mouth daily. 06/16/23  Yes Ladona Heinz, MD    No Known Allergies  Patient Active Problem List   Diagnosis Date Noted   Hypogonadism in male 11/19/2022   Long term (current) use of anticoagulants    Benign hypertension    Mixed hyperlipidemia    Atherosclerosis of native coronary artery of native heart without angina pectoris    History of coronary artery stent placement    IHD (ischemic heart disease) 12/16/2016   History of MI (myocardial infarction) 12/16/2016   History of depression 12/16/2016   Hearing loss 05/04/2012   Dyslipidemia 01/26/2008   Essential hypertension 01/26/2008   INGUINAL HERNIA, HX OF 01/26/2008    Past Medical History:  Diagnosis Date   Arthritis    Depression    Depression    Phreesia 01/03/2020   Hyperlipidemia    controlled with meds    Hypertension    NSTEMI (  non-ST elevated myocardial infarction) (HCC) 04/25/2014   Postmenopausal    Substance abuse (HCC)     Past Surgical History:  Procedure Laterality Date   A-FLUTTER ABLATION N/A 02/24/2021   Procedure: A-FLUTTER ABLATION;  Surgeon: Cindie Ole DASEN, MD;  Location: [facility] INVASIVE CV LAB;  Service: Cardiovascular;  Laterality: N/A;   COLONOSCOPY     HERNIA REPAIR     LEFT HEART CATHETERIZATION WITH CORONARY ANGIOGRAM N/A 04/25/2014   Procedure: LEFT HEART CATHETERIZATION WITH CORONARY ANGIOGRAM;  Surgeon: Erick JONELLE Bergamo, MD;  Location: [facility] CATH LAB;  Service: Cardiovascular;  Laterality: N/A; with stents placement    POLYPECTOMY     VASECTOMY      Social History    Socioeconomic History   Marital status: Married    Spouse name: Not on file   Number of children: 2   Years of education: Not on file   Highest education level: 12th grade  Occupational History   Not on file  Tobacco Use   Smoking status: Former    Current packs/day: 0.00    Average packs/day: 0.5 packs/day for 10.0 years (5.0 ttl pk-yrs)    Types: Cigarettes    Start date: 04/12/1972    Quit date: 04/12/1982    Years since quitting: 41.4   Smokeless tobacco: Never  Vaping Use   Vaping status: Never Used  Substance and Sexual Activity   Alcohol use: No    Alcohol/week: 0.0 standard drinks of alcohol   Drug use: No   Sexual activity: Yes  Other Topics Concern   Not on file  Social History Narrative   Not on file   Social Drivers of Health   Financial Resource Strain: Low Risk  (09/16/2023)   Overall Financial Resource Strain (CARDIA)    Difficulty of Paying Living Expenses: Not very hard  Food Insecurity: No Food Insecurity (09/16/2023)   Hunger Vital Sign    Worried About Running Out of Food in the Last Year: Never true    Ran Out of Food in the Last Year: Never true  Transportation Needs: No Transportation Needs (09/16/2023)   PRAPARE - Administrator, Civil Service (Medical): No    Lack of Transportation (Non-Medical): No  Physical Activity: Insufficiently Active (09/16/2023)   Exercise Vital Sign    Days of Exercise per Week: 3 days    Minutes of Exercise per Session: 20 min  Stress: Stress Concern Present (09/16/2023)   [employer] of Occupational Health - Occupational Stress Questionnaire    Feeling of Stress: To some extent  Social Connections: Moderately Integrated (09/16/2023)   Social Connection and Isolation Panel    Frequency of Communication with Friends and Family: Once a week    Frequency of Social Gatherings with Friends and Family: Once a week    Attends Religious Services: More than 4 times per year    Active Member of [employer] or  Organizations: Yes    Attends Engineer, structural: More than 4 times per year    Marital Status: Married  Catering manager Violence: Not on file    Family History  Problem Relation Age of Onset   Cancer Mother        lung   Hypertension Father    Alzheimer's disease Father    Heart disease Sister    Heart disease Brother    Stroke Maternal Grandmother    Alcohol abuse Maternal Grandfather    Cancer Maternal Grandfather    Stroke Paternal Grandmother  Cancer Paternal Grandfather    Colon cancer Neg Hx    Colon polyps Neg Hx    Esophageal cancer Neg Hx    Rectal cancer Neg Hx    Stomach cancer Neg Hx      Review of Systems  Constitutional:  Positive for malaise/fatigue. Negative for chills and fever.  HENT: Negative.  Negative for congestion and sore throat.   Respiratory: Negative.  Negative for cough and shortness of breath.   Cardiovascular: Negative.  Negative for chest pain and palpitations.  Gastrointestinal:  Negative for abdominal pain, diarrhea, nausea and vomiting.  Genitourinary:  Negative for dysuria and urgency.  Neurological: Negative.  Negative for dizziness and headaches.  All other systems reviewed and are negative.   Vitals:   09/16/23 0906  BP: 128/64  Pulse: (!) 56  Temp: 98.2 F (36.8 C)  SpO2: 97%    Physical Exam Vitals reviewed.  Constitutional:      Appearance: Normal appearance.  HENT:     Head: Normocephalic.     Mouth/Throat:     Mouth: Mucous membranes are moist.     Pharynx: Oropharynx is clear.  Eyes:     Extraocular Movements: Extraocular movements intact.     Conjunctiva/sclera: Conjunctivae normal.     Pupils: Pupils are equal, round, and reactive to light.  Neck:     Vascular: No carotid bruit.  Cardiovascular:     Rate and Rhythm: Normal rate and regular rhythm.     Pulses: Normal pulses.     Heart sounds: Normal heart sounds.  Pulmonary:     Effort: Pulmonary effort is normal.     Breath sounds: Normal  breath sounds.  Abdominal:     Palpations: Abdomen is soft.     Tenderness: There is no abdominal tenderness.  Musculoskeletal:     Cervical back: No tenderness.  Lymphadenopathy:     Cervical: No cervical adenopathy.  Skin:    General: Skin is warm and dry.     Capillary Refill: Capillary refill takes less than 2 seconds.  Neurological:     General: No focal deficit present.     Mental Status: Edwin Mckee is alert and oriented to person, place, and time.  Psychiatric:        Mood and Affect: Mood normal.        Behavior: Behavior normal.      ASSESSMENT & PLAN: A total of 44 minutes was spent with the patient and counseling/coordination of care regarding preparing for this visit, review of most recent office visit notes, review of multiple chronic medical conditions and their management, differential diagnosis of general weakness and need for workup, review of all medications, review of most recent bloodwork results, review of health maintenance items, education on nutrition, prognosis, documentation, and need for follow up.   Problem List Items Addressed This Visit       Cardiovascular and Mediastinum   Essential hypertension   BP Readings from Last 3 Encounters:  09/16/23 128/64  07/22/23 134/78  04/07/23 (!) 140/80  Continue monitoring blood pressure readings at home several times a day for the next couple of weeks and contact the office if numbers persistently abnormal Continue valsartan  320 mg daily and Cardizem  120 mg daily Did not take Hygroton  today.  Stop Hygroton  for couple days while watching blood pressure readings.  Reintroduce if numbers persistently abnormally elevated.       IHD (ischemic heart disease)   No anginal episodes. Clinically stable.  No concerns. Continues daily  baby aspirin       Atherosclerosis of native coronary artery of native heart without angina pectoris   Stable.  Most recent cardiology assessment reviewed. History of stent placement. No  anginal episodes.  Stable.  No concerns.        Genitourinary   Stage 3a chronic kidney disease (HCC)   Chronic stable condition Advised to stay well-hydrated and avoid NSAIDs is much as possible        Other   General weakness - Primary   Clinically stable.  No red flag signs or symptoms. History of hypertension with several low blood pressure readings at home probably contributing to symptoms but most likely the result of ongoing process instead. Presently on Hygroton , valsartan , and Cardizem .  Did not take Hygroton  today.  May be overmedicated. Recommend to stop Hygroton  and closely monitor blood pressure readings.  Restart Hygroton  if numbers persistently elevated. Recommend blood work today Differential diagnosis discussed.  Possible viral illness. Advised to contact the office if no better or worse during the next several days Advised to rest and stay well-hydrated and avoid excessive outdoor exposure due to heat.      Relevant Orders   CBC with Differential/Platelet   Comprehensive metabolic panel with GFR   TSH   Vitamin B12   VITAMIN D  25 Hydroxy (Vit-D Deficiency, Fractures)     Patient Instructions  Weakness Weakness is a lack of strength. You may feel weak all over your body (generalized), or you may feel weak in one part of your body (focal). Common causes of weakness include: Infection and disorders of the body's defense system (immune system). Physical exhaustion. Internal bleeding or other blood loss that results in a lack of red blood cells (anemia). Dehydration. An imbalance in mineral (electrolyte) levels, such as potassium. Chronic kidney or liver disease. Cancer. Other causes include: Some medicines or cancer treatment. Stress, anxiety, or depression. Heart disease, circulation problems, or stroke. Nervous system disorders. Thyroid disorders. Loss of muscle strength because of age or inactivity. Poor sleep quality or sleep disorders. The cause of  your weakness may not be known. Some causes of weakness can be serious, so it is important to see your health care provider. Follow these instructions at home: Activity Rest as needed. Try to get enough sleep. Most adults need 7-8 hours of quality sleep each night. Talk to your health care provider about how much sleep you need. Do exercises, such as arm curls and leg raises, for 30 minutes at least 2 days a week or as told by your health care provider. This helps build muscle strength. Consider working with a physical therapist or trainer who can develop an exercise plan to help you gain muscle strength. General instructions  Take over-the-counter and prescription medicines only as told by your health care provider. Eat a healthy, well-balanced diet. This includes: Proteins to build muscles, such as lean meats and fish. Fresh fruits and vegetables. Carbohydrates to boost energy, such as whole grains. Drink enough fluid to keep your urine pale yellow. Keep all follow-up visits. This is important. Contact a health care provider if: Your weakness does not improve or gets worse. Your weakness affects your ability to think clearly. Your weakness affects your ability to do your normal daily activities. Get help right away if: You develop sudden weakness, especially on one side of your face or body. You have chest pain. You have trouble breathing or shortness of breath. You have problems with your vision. You have trouble talking or  swallowing. You have trouble standing or walking. You are light-headed or lose consciousness. These symptoms may be an emergency. Get help right away. Call 911. Do not wait to see if the symptoms will go away. Do not drive yourself to the hospital. Summary Weakness is a lack of strength. You may feel weak all over your body or just in one specific part of your body. Weakness can be caused by a variety of things. In some cases, the cause may be unknown. Rest as  needed, and try to get enough sleep. Most adults need 7-8 hours of quality sleep each night. Eat a healthy, well-balanced diet. This information is not intended to replace advice given to you by your health care provider. Make sure you discuss any questions you have with your health care provider. Document Revised: 02/02/2021 Document Reviewed: 02/02/2021 Elsevier Patient Education  2024 Elsevier Inc.    Emil Schaumann, MD [facility]

## 2023-09-16 NOTE — Assessment & Plan Note (Signed)
 Stable.  Most recent cardiology assessment reviewed. History of stent placement. No anginal episodes.  Stable.  No concerns.

## 2023-09-16 NOTE — Patient Instructions (Signed)
Weakness Weakness is a lack of strength. You may feel weak all over your body (generalized), or you may feel weak in one part of your body (focal). Common causes of weakness include: Infection and disorders of the body's defense system (immune system). Physical exhaustion. Internal bleeding or other blood loss that results in a lack of red blood cells (anemia). Dehydration. An imbalance in mineral (electrolyte) levels, such as potassium. Chronic kidney or liver disease. Cancer. Other causes include: Some medicines or cancer treatment. Stress, anxiety, or depression. Heart disease, circulation problems, or stroke. Nervous system disorders. Thyroid disorders. Loss of muscle strength because of age or inactivity. Poor sleep quality or sleep disorders. The cause of your weakness may not be known. Some causes of weakness can be serious, so it is important to see your health care provider. Follow these instructions at home: Activity Rest as needed. Try to get enough sleep. Most adults need 7-8 hours of quality sleep each night. Talk to your health care provider about how much sleep you need. Do exercises, such as arm curls and leg raises, for 30 minutes at least 2 days a week or as told by your health care provider. This helps build muscle strength. Consider working with a physical therapist or trainer who can develop an exercise plan to help you gain muscle strength. General instructions  Take over-the-counter and prescription medicines only as told by your health care provider. Eat a healthy, well-balanced diet. This includes: Proteins to build muscles, such as lean meats and fish. Fresh fruits and vegetables. Carbohydrates to boost energy, such as whole grains. Drink enough fluid to keep your urine pale yellow. Keep all follow-up visits. This is important. Contact a health care provider if: Your weakness does not improve or gets worse. Your weakness affects your ability to think  clearly. Your weakness affects your ability to do your normal daily activities. Get help right away if: You develop sudden weakness, especially on one side of your face or body. You have chest pain. You have trouble breathing or shortness of breath. You have problems with your vision. You have trouble talking or swallowing. You have trouble standing or walking. You are light-headed or lose consciousness. These symptoms may be an emergency. Get help right away. Call 911. Do not wait to see if the symptoms will go away. Do not drive yourself to the hospital. Summary Weakness is a lack of strength. You may feel weak all over your body or just in one specific part of your body. Weakness can be caused by a variety of things. In some cases, the cause may be unknown. Rest as needed, and try to get enough sleep. Most adults need 7-8 hours of quality sleep each night. Eat a healthy, well-balanced diet. This information is not intended to replace advice given to you by your health care provider. Make sure you discuss any questions you have with your health care provider. Document Revised: 02/02/2021 Document Reviewed: 02/02/2021 Elsevier Patient Education  2024 [employer].

## 2023-09-16 NOTE — Assessment & Plan Note (Signed)
 No anginal episodes. Clinically stable.  No concerns. Continues daily baby aspirin 

## 2023-09-16 NOTE — Assessment & Plan Note (Signed)
 Clinically stable.  No red flag signs or symptoms. History of hypertension with several low blood pressure readings at home probably contributing to symptoms but most likely the result of ongoing process instead. Presently on Hygroton , valsartan , and Cardizem .  Did not take Hygroton  today.  May be overmedicated. Recommend to stop Hygroton  and closely monitor blood pressure readings.  Restart Hygroton  if numbers persistently elevated. Recommend blood work today Differential diagnosis discussed.  Possible viral illness. Advised to contact the office if no better or worse during the next several days Advised to rest and stay well-hydrated and avoid excessive outdoor exposure due to heat.

## 2023-09-16 NOTE — Assessment & Plan Note (Signed)
 BP Readings from Last 3 Encounters:  09/16/23 128/64  07/22/23 134/78  04/07/23 (!) 140/80  Continue monitoring blood pressure readings at home several times a day for the next couple of weeks and contact the office if numbers persistently abnormal Continue valsartan  320 mg daily and Cardizem  120 mg daily Did not take Hygroton  today.  Stop Hygroton  for couple days while watching blood pressure readings.  Reintroduce if numbers persistently abnormally elevated.

## 2023-09-16 NOTE — Assessment & Plan Note (Signed)
 Chronic stable condition Advised to stay well-hydrated and avoid NSAIDs is much as possible

## 2023-09-20 ENCOUNTER — Other Ambulatory Visit (INDEPENDENT_AMBULATORY_CARE_PROVIDER_SITE_OTHER)

## 2023-09-20 ENCOUNTER — Ambulatory Visit: Payer: Self-pay | Admitting: Emergency Medicine

## 2023-09-20 DIAGNOSIS — R531 Weakness: Secondary | ICD-10-CM | POA: Diagnosis not present

## 2023-09-20 LAB — CBC WITH DIFFERENTIAL/PLATELET
Basophils Absolute: 0 K/uL (ref 0.0–0.1)
Basophils Relative: 0.6 % (ref 0.0–3.0)
Eosinophils Absolute: 0.1 K/uL (ref 0.0–0.7)
Eosinophils Relative: 1.4 % (ref 0.0–5.0)
HCT: 39 % (ref 39.0–52.0)
Hemoglobin: 13.3 g/dL (ref 13.0–17.0)
Lymphocytes Relative: 28.4 % (ref 12.0–46.0)
Lymphs Abs: 2.2 K/uL (ref 0.7–4.0)
MCHC: 34.2 g/dL (ref 30.0–36.0)
MCV: 86.8 fl (ref 78.0–100.0)
Monocytes Absolute: 0.6 K/uL (ref 0.1–1.0)
Monocytes Relative: 8.3 % (ref 3.0–12.0)
Neutro Abs: 4.8 K/uL (ref 1.4–7.7)
Neutrophils Relative %: 61.3 % (ref 43.0–77.0)
Platelets: 208 K/uL (ref 150.0–400.0)
RBC: 4.49 Mil/uL (ref 4.22–5.81)
RDW: 13.8 % (ref 11.5–15.5)
WBC: 7.8 K/uL (ref 4.0–10.5)

## 2023-09-20 LAB — VITAMIN B12: Vitamin B-12: 288 pg/mL (ref 211–911)

## 2023-09-20 LAB — COMPREHENSIVE METABOLIC PANEL WITH GFR
ALT: 20 U/L (ref 0–53)
AST: 19 U/L (ref 0–37)
Albumin: 4.1 g/dL (ref 3.5–5.2)
Alkaline Phosphatase: 45 U/L (ref 39–117)
BUN: 20 mg/dL (ref 6–23)
CO2: 29 meq/L (ref 19–32)
Calcium: 9 mg/dL (ref 8.4–10.5)
Chloride: 102 meq/L (ref 96–112)
Creatinine, Ser: 1.06 mg/dL (ref 0.40–1.50)
GFR: 68.59 mL/min (ref >60.00)
Glucose, Bld: 94 mg/dL (ref 70–99)
Potassium: 3.8 meq/L (ref 3.5–5.1)
Sodium: 137 meq/L (ref 135–145)
Total Bilirubin: 0.6 mg/dL (ref 0.2–1.2)
Total Protein: 6.3 g/dL (ref 6.0–8.3)

## 2023-09-20 LAB — VITAMIN D 25 HYDROXY (VIT D DEFICIENCY, FRACTURES): VITD: 41.02 ng/mL (ref 30.00–100.00)

## 2023-09-20 LAB — TSH: TSH: 4.28 u[IU]/mL (ref 0.35–5.50)

## 2023-10-26 DIAGNOSIS — H4052X1 Glaucoma secondary to other eye disorders, left eye, mild stage: Secondary | ICD-10-CM | POA: Diagnosis not present

## 2023-11-13 ENCOUNTER — Other Ambulatory Visit: Payer: Self-pay | Admitting: Cardiology

## 2023-11-13 DIAGNOSIS — E7800 Pure hypercholesterolemia, unspecified: Secondary | ICD-10-CM

## 2023-11-13 DIAGNOSIS — I10 Essential (primary) hypertension: Secondary | ICD-10-CM

## 2023-11-13 DIAGNOSIS — I25118 Atherosclerotic heart disease of native coronary artery with other forms of angina pectoris: Secondary | ICD-10-CM

## 2023-11-17 ENCOUNTER — Other Ambulatory Visit: Payer: Self-pay | Admitting: Cardiology

## 2023-11-17 DIAGNOSIS — I10 Essential (primary) hypertension: Secondary | ICD-10-CM

## 2023-12-24 DIAGNOSIS — M79642 Pain in left hand: Secondary | ICD-10-CM | POA: Diagnosis not present

## 2023-12-24 DIAGNOSIS — M79646 Pain in unspecified finger(s): Secondary | ICD-10-CM | POA: Insufficient documentation

## 2023-12-24 DIAGNOSIS — M79645 Pain in left finger(s): Secondary | ICD-10-CM | POA: Diagnosis not present

## 2024-01-08 ENCOUNTER — Other Ambulatory Visit: Payer: Self-pay | Admitting: Cardiology

## 2024-01-08 DIAGNOSIS — I10 Essential (primary) hypertension: Secondary | ICD-10-CM

## 2024-01-20 DIAGNOSIS — M47816 Spondylosis without myelopathy or radiculopathy, lumbar region: Secondary | ICD-10-CM | POA: Insufficient documentation

## 2024-03-24 ENCOUNTER — Ambulatory Visit

## 2024-03-27 ENCOUNTER — Telehealth: Payer: Self-pay

## 2024-03-27 DIAGNOSIS — M545 Low back pain, unspecified: Secondary | ICD-10-CM

## 2024-03-27 NOTE — Telephone Encounter (Signed)
 Copied from CRM [id number]. Topic: Referral - Request for Referral >> Mar 27, 2024  8:52 AM Donna BRAVO wrote: Did the patient discuss referral with their provider in the last year? no (If No - schedule appointment) (If Yes - send message)  Appointment offered? No  Type of order/referral and detailed reason for visit: physical therapy  Preference of office, provider, location: [facility] physical therapist  [address] [place]   [phone]  If referral order, have you been seen by this specialty before? No (If Yes, this issue or another issue? When? Where?  Can we respond through MyChart? Yes

## 2024-03-28 NOTE — Telephone Encounter (Signed)
Please place referral as requested.  Thanks.

## 2024-03-30 ENCOUNTER — Ambulatory Visit (INDEPENDENT_AMBULATORY_CARE_PROVIDER_SITE_OTHER)

## 2024-03-30 VITALS — Ht 70.0 in | Wt 161.0 lb

## 2024-03-30 DIAGNOSIS — Z0000 Encounter for general adult medical examination without abnormal findings: Secondary | ICD-10-CM

## 2024-03-30 NOTE — Progress Notes (Signed)
 "  Chief Complaint  Patient presents with   Medicare Wellness     Subjective:   Edwin E Lenis Jr. is a 79 y.o. adult who presents for a Medicare Annual Wellness Visit.  Visit info / Clinical Intake: Medicare Wellness Visit Type:: Initial Annual Wellness Visit Persons participating in visit and providing information:: patient Medicare Wellness Visit Mode:: Telephone If telephone:: video declined Since this visit was completed virtually, some vitals may be partially provided or unavailable. Missing vitals are due to the limitations of the virtual format.: Unable to obtain vitals - no equipment If Telephone or Video please confirm:: I connected with patient using audio/video enable telemedicine. I verified patient identity with two identifiers, discussed telehealth limitations, and patient agreed to proceed. Patient Location:: Home Provider Location:: Home Interpreter Needed?: No Pre-visit prep was completed: yes AWV questionnaire completed by patient prior to visit?: yes Date:: 03/28/24 Living arrangements:: (Patient-Rptd) lives with spouse/significant other Patient's Overall Health Status Rating: (!) (Patient-Rptd) poor Typical amount of pain: (!) (Patient-Rptd) a lot Does pain affect daily life?: (!) (Patient-Rptd) yes Are you currently prescribed opioids?: no  Dietary Habits and Nutritional Risks How many meals a day?: (Patient-Rptd) 3 Eats fruit and vegetables daily?: (Patient-Rptd) yes Most meals are obtained by: (Patient-Rptd) preparing own meals In the last 2 weeks, have you had any of the following?: none Diabetic:: no  Functional Status Activities of Daily Living (to include ambulation/medication): (Patient-Rptd) Independent Ambulation: (Patient-Rptd) Independent Medication Administration: (Patient-Rptd) Independent Home Management (perform basic housework or laundry): (Patient-Rptd) Independent Manage your own finances?: (Patient-Rptd) yes Primary transportation is:  (Patient-Rptd) driving Concerns about vision?: (!) yes (using eye drops eye condition) Concerns about hearing?: (!) yes Uses hearing aids?: (!) yes Hear whispered voice?: (!) no *in-person visit only*  Fall Screening Falls in the past year?: (Patient-Rptd) 1 Number of falls in past year: (Patient-Rptd) 0 Was there an injury with Fall?: (Patient-Rptd) 1 Fall Risk Category Calculator: (Patient-Rptd) 2 Patient Fall Risk Level: (Patient-Rptd) Moderate Fall Risk  Fall Risk Patient at Risk for Falls Due to: Impaired balance/gait Fall risk Follow up: Falls evaluation completed; Falls prevention discussed  Home and Transportation Safety: All rugs have non-skid backing?: (Patient-Rptd) N/A, no rugs All stairs or steps have railings?: (Patient-Rptd) yes Grab bars in the bathtub or shower?: (Patient-Rptd) yes Have non-skid surface in bathtub or shower?: (Patient-Rptd) yes Good home lighting?: (Patient-Rptd) yes Regular seat belt use?: (Patient-Rptd) yes Hospital stays in the last year:: (Patient-Rptd) no  Cognitive Assessment Difficulty concentrating, remembering, or making decisions? : (Patient-Rptd) no Will 6CIT or Mini Cog be Completed: no 6CIT or Mini Cog Declined: patient alert, oriented, able to answer questions appropriately and recall recent events  Advance Directives (For Healthcare) Does Patient Have a Medical Advance Directive?: Yes Type of Advance Directive: Healthcare Power of [place]; Living will; Out of facility DNR (pink MOST or yellow form) Copy of Healthcare Power of Attorney in Chart?: No - copy requested Copy of Living Will in Chart?: No - copy requested Out of facility DNR (pink MOST or yellow form) in Chart? (Ambulatory ONLY): No - copy requested  Reviewed/Updated  Reviewed/Updated: Reviewed All (Medical, Surgical, Family, Medications, Allergies, Care Teams, Patient Goals)    Allergies (verified) Patient has no known allergies.   Current Medications  (verified) Outpatient Encounter Medications as of 03/30/2024  Medication Sig   acetaminophen  (TYLENOL ) 500 MG tablet Take 1,000 mg by mouth every 8 (eight) hours as needed for moderate pain.   aspirin  (ASPIRIN  CHILDRENS) 81 MG chewable  tablet Chew 1 tablet (81 mg total) by mouth daily.   atorvastatin  (LIPITOR ) 40 MG tablet TAKE 1 TABLET BY MOUTH DAILY   chlorthalidone  (HYGROTON ) 25 MG tablet TAKE 1 TABLET BY MOUTH IN THE  MORNING   diltiazem  (CARDIZEM  CD) 120 MG 24 hr capsule TAKE 1 CAPSULE BY MOUTH EVERY  EVENING   FLUoxetine  (PROZAC ) 40 MG capsule Take 1 capsule (40 mg total) by mouth daily.   valsartan  (DIOVAN ) 320 MG tablet TAKE 1 TABLET BY MOUTH DAILY   No facility-administered encounter medications on file as of 03/30/2024.    History: Past Medical History:  Diagnosis Date   Arthritis    Depression    Depression    Phreesia 01/03/2020   Hyperlipidemia    controlled with meds    Hypertension    NSTEMI (non-ST elevated myocardial infarction) (HCC) 04/25/2014   Postmenopausal    Substance abuse (HCC)    Past Surgical History:  Procedure Laterality Date   A-FLUTTER ABLATION N/A 02/24/2021   Procedure: A-FLUTTER ABLATION;  Surgeon: Cindie Ole DASEN, MD;  Location: [facility] INVASIVE CV LAB;  Service: Cardiovascular;  Laterality: N/A;   COLONOSCOPY     HERNIA REPAIR     LEFT HEART CATHETERIZATION WITH CORONARY ANGIOGRAM N/A 04/25/2014   Procedure: LEFT HEART CATHETERIZATION WITH CORONARY ANGIOGRAM;  Surgeon: Erick JONELLE Bergamo, MD;  Location: [facility] CATH LAB;  Service: Cardiovascular;  Laterality: N/A; with stents placement    POLYPECTOMY     VASECTOMY     Family History  Problem Relation Age of Onset   Cancer Mother        lung   Hypertension Father    Alzheimer's disease Father    Heart disease Sister    Heart disease Brother    Stroke Maternal Grandmother    Alcohol abuse Maternal Grandfather    Cancer Maternal Grandfather    Stroke Paternal Grandmother    Cancer Paternal  Grandfather    Colon cancer Neg Hx    Colon polyps Neg Hx    Esophageal cancer Neg Hx    Rectal cancer Neg Hx    Stomach cancer Neg Hx    Social History   Occupational History    Employer: RETIRED    Industry: Other / Not Applicable  Tobacco Use   Smoking status: Former    Current packs/day: 0.00    Average packs/day: 0.5 packs/day for 10.0 years (5.0 ttl pk-yrs)    Types: Cigarettes    Start date: 04/12/1972    Quit date: 04/12/1982    Years since quitting: 41.9   Smokeless tobacco: Never  Vaping Use   Vaping status: Never Used  Substance and Sexual Activity   Alcohol use: No    Alcohol/week: 0.0 standard drinks of alcohol   Drug use: No   Sexual activity: Yes   Tobacco Counseling Counseling given: Not Answered  SDOH Screenings   Food Insecurity: No Food Insecurity (03/28/2024)  Housing: Low Risk (03/28/2024)  Transportation Needs: No Transportation Needs (03/28/2024)  Utilities: Not At Risk (03/30/2024)  Depression (PHQ2-9): Medium Risk (03/30/2024)  Financial Resource Strain: Low Risk (03/28/2024)  Physical Activity: Inactive (03/28/2024)  Social Connections: Moderately Integrated (03/28/2024)  Stress: Stress Concern Present (03/28/2024)  Tobacco Use: Medium Risk (03/30/2024)  Health Literacy: Adequate Health Literacy (03/30/2024)   See flowsheets for full screening details  Depression Screen PHQ 2 & 9 Depression Scale- Over the past 2 weeks, how often have you been bothered by any of the following problems? Little interest or pleasure  in doing things: 2 (due to his back condition) Feeling down, depressed, or hopeless (PHQ Adolescent also includes...irritable): 2 (due to his back condition) PHQ-2 Total Score: 4 Trouble falling or staying asleep, or sleeping too much: 0 Feeling tired or having little energy: 2 (both) Poor appetite or overeating (PHQ Adolescent also includes...weight loss): 0 Feeling bad about yourself - or that you are a failure or have let yourself or  your family down: 0 Trouble concentrating on things, such as reading the newspaper or watching television (PHQ Adolescent also includes...like school work): 0 Moving or speaking so slowly that other people could have noticed. Or the opposite - being so fidgety or restless that you have been moving around a lot more than usual: 0 Thoughts that you would be better off dead, or of hurting yourself in some way: 0 PHQ-9 Total Score: 6 If you checked off any problems, how difficult have these problems made it for you to do your work, take care of things at home, or get along with other people?: Somewhat difficult  Depression Treatment Depression Interventions/Treatment : Medication     Goals Addressed               This Visit's Progress     Patient Stated (pt-stated)        To get back better-to start back walking/2026             Objective:    Today's Vitals   03/30/24 1005  Weight: 161 lb (73 kg)  Height: 5' 10 (1.778 m)   Body mass index is 23.1 kg/m.  Hearing/Vision screen Hearing Screening - Comments:: Wears hearing aides Vision Screening - Comments:: Uses eye drops for eye condition/Dr. Dunn/UTD Immunizations and Health Maintenance Health Maintenance  Topic Date Due   Bone Density Scan  Never done   Pneumococcal Vaccine: 50+ Years (2 of 2 - PPSV23, PCV20, or PCV21) 02/16/2018   COVID-19 Vaccine (4 - 2025-26 season) 11/15/2023   Medicare Annual Wellness (AWV)  03/30/2025   Colonoscopy  05/23/2027   DTaP/Tdap/Td (3 - Td or Tdap) 06/30/2032   Influenza Vaccine  Completed   Hepatitis C Screening  Completed   Zoster Vaccines- Shingrix  Completed   Meningococcal B Vaccine  Aged Out        Assessment/Plan:  This is a routine wellness examination for Edwin Mckee.  Patient Care Team: Purcell Emil Schanz, MD as PCP - General (Internal Medicine) Ladona Heinz, MD as PCP - Cardiology (Cardiology) Thomasena Sherran BROCKS, PA-C (Cardiology) Abigail Maude [facility])  I have  personally reviewed and noted the following in the patients chart:   Medical and social history Use of alcohol, tobacco or illicit drugs  Current medications and supplements including opioid prescriptions. Functional ability and status Nutritional status Physical activity Advanced directives List of other physicians Hospitalizations, surgeries, and ER visits in previous 12 months Vitals Screenings to include cognitive, depression, and falls Referrals and appointments  No orders of the defined types were placed in this encounter.  In addition, I have reviewed and discussed with patient certain preventive protocols, quality metrics, and best practice recommendations. A written personalized care plan for preventive services as well as general preventive health recommendations were provided to patient.   Ariyana Faw L Shaylene Paganelli, CMA   03/30/2024   Return in 1 year (on 03/30/2025).  After Visit Summary: (MyChart) Due to this being a telephonic visit, the after visit summary with patients personalized plan was offered to patient via MyChart   Nurse  Notes: Patient is due for a pneumonia vaccine.  He is due for a DEXA and would like to discuss that during his next office visit. "

## 2024-03-30 NOTE — Patient Instructions (Addendum)
 Edwin Mckee,  Thank you for taking the time for your Medicare Wellness Visit. I appreciate your continued commitment to your health goals. Please review the care plan we discussed, and feel free to reach out if I can assist you further.  Please note that Annual Wellness Visits do not include a physical exam. Some assessments may be limited, especially if the visit was conducted virtually. If needed, we may recommend an in-person follow-up with your provider.  Ongoing Care Seeing your primary care provider every 3 to 6 months helps us  monitor your health and provide consistent, personalized care. Last office visit on 10/06/2023.  You are due for a pneumonia vaccine.    Referrals If a referral was made during today's visit and you haven't received any updates within two weeks, please contact the referred provider directly to check on the status.  Recommended Screenings:  Health Maintenance  Topic Date Due   Medicare Annual Wellness Visit  Never done   Osteoporosis screening with Bone Density Scan  Never done   Pneumococcal Vaccine for age over 68 (2 of 2 - PPSV23, PCV20, or PCV21) 02/16/2018   Zoster (Shingles) Vaccine (2 of 2) 03/28/2021   Flu Shot  10/15/2023   COVID-19 Vaccine (4 - 2025-26 season) 11/15/2023   Colon Cancer Screening  05/23/2027   DTaP/Tdap/Td vaccine (3 - Td or Tdap) 06/30/2032   Hepatitis C Screening  Completed   Meningitis B Vaccine  Aged Out       03/28/2024    7:07 PM  Advanced Directives  Does Patient Have a Medical Advance Directive? Yes  Type of Estate Agent of [place];Living will;Out of facility DNR (pink MOST or yellow form)  Copy of Healthcare Power of Attorney in Chart? No - copy requested    Vision: Annual vision screenings are recommended for early detection of glaucoma, cataracts, and diabetic retinopathy. These exams can also reveal signs of chronic conditions such as diabetes and high blood pressure.  Dental: Annual dental  screenings help detect early signs of oral cancer, gum disease, and other conditions linked to overall health, including heart disease and diabetes.  Please see the attached documents for additional preventive care recommendations.

## 2024-04-02 ENCOUNTER — Other Ambulatory Visit: Payer: Self-pay | Admitting: Cardiology

## 2024-04-02 DIAGNOSIS — I10 Essential (primary) hypertension: Secondary | ICD-10-CM

## 2024-04-27 ENCOUNTER — Ambulatory Visit: Admitting: Cardiology
